# Patient Record
Sex: Female | Born: 1945 | Race: White | Hispanic: No | Marital: Married | Smoking: Former smoker
Health system: Southern US, Community
[De-identification: ages and names within clinical notes are randomized; demographics above are authoritative.]

## PROBLEM LIST (undated history)

## (undated) DIAGNOSIS — K5790 Diverticulosis of intestine, part unspecified, without perforation or abscess without bleeding: Secondary | ICD-10-CM

## (undated) DIAGNOSIS — F329 Major depressive disorder, single episode, unspecified: Secondary | ICD-10-CM

## (undated) DIAGNOSIS — R413 Other amnesia: Secondary | ICD-10-CM

## (undated) DIAGNOSIS — K219 Gastro-esophageal reflux disease without esophagitis: Secondary | ICD-10-CM

## (undated) DIAGNOSIS — G43909 Migraine, unspecified, not intractable, without status migrainosus: Secondary | ICD-10-CM

## (undated) DIAGNOSIS — M810 Age-related osteoporosis without current pathological fracture: Secondary | ICD-10-CM

## (undated) DIAGNOSIS — I639 Cerebral infarction, unspecified: Secondary | ICD-10-CM

## (undated) DIAGNOSIS — M1990 Unspecified osteoarthritis, unspecified site: Secondary | ICD-10-CM

## (undated) DIAGNOSIS — E785 Hyperlipidemia, unspecified: Secondary | ICD-10-CM

## (undated) DIAGNOSIS — T7840XA Allergy, unspecified, initial encounter: Secondary | ICD-10-CM

## (undated) DIAGNOSIS — L940 Localized scleroderma [morphea]: Secondary | ICD-10-CM

## (undated) DIAGNOSIS — K589 Irritable bowel syndrome without diarrhea: Secondary | ICD-10-CM

## (undated) DIAGNOSIS — F32A Depression, unspecified: Secondary | ICD-10-CM

## (undated) DIAGNOSIS — H269 Unspecified cataract: Secondary | ICD-10-CM

## (undated) DIAGNOSIS — I609 Nontraumatic subarachnoid hemorrhage, unspecified: Principal | ICD-10-CM

## (undated) DIAGNOSIS — F419 Anxiety disorder, unspecified: Secondary | ICD-10-CM

## (undated) DIAGNOSIS — S62109A Fracture of unspecified carpal bone, unspecified wrist, initial encounter for closed fracture: Secondary | ICD-10-CM

## (undated) DIAGNOSIS — I619 Nontraumatic intracerebral hemorrhage, unspecified: Secondary | ICD-10-CM

## (undated) DIAGNOSIS — I10 Essential (primary) hypertension: Principal | ICD-10-CM

## (undated) HISTORY — DX: Hyperlipidemia, unspecified: E78.5

## (undated) HISTORY — PX: ANKLE FRACTURE SURGERY: SHX122

## (undated) HISTORY — PX: TUBAL LIGATION: SHX77

## (undated) HISTORY — DX: Depression, unspecified: F32.A

## (undated) HISTORY — PX: OTHER SURGICAL HISTORY: SHX169

## (undated) HISTORY — DX: Essential (primary) hypertension: I10

## (undated) HISTORY — DX: Age-related osteoporosis without current pathological fracture: M81.0

## (undated) HISTORY — DX: Unspecified cataract: H26.9

## (undated) HISTORY — DX: Gastro-esophageal reflux disease without esophagitis: K21.9

## (undated) HISTORY — DX: Other amnesia: R41.3

## (undated) HISTORY — DX: Unspecified osteoarthritis, unspecified site: M19.90

## (undated) HISTORY — DX: Migraine, unspecified, not intractable, without status migrainosus: G43.909

## (undated) HISTORY — DX: Fracture of unspecified carpal bone, unspecified wrist, initial encounter for closed fracture: S62.109A

## (undated) HISTORY — DX: Cerebral infarction, unspecified: I63.9

## (undated) HISTORY — DX: Diverticulosis of intestine, part unspecified, without perforation or abscess without bleeding: K57.90

## (undated) HISTORY — PX: COLONOSCOPY: SHX174

## (undated) HISTORY — PX: POLYPECTOMY: SHX149

## (undated) HISTORY — DX: Major depressive disorder, single episode, unspecified: F32.9

## (undated) HISTORY — DX: Anxiety disorder, unspecified: F41.9

## (undated) HISTORY — DX: Nontraumatic intracerebral hemorrhage, unspecified: I61.9

## (undated) HISTORY — DX: Nontraumatic subarachnoid hemorrhage, unspecified: I60.9

## (undated) HISTORY — DX: Irritable bowel syndrome, unspecified: K58.9

## (undated) HISTORY — DX: Allergy, unspecified, initial encounter: T78.40XA

## (undated) HISTORY — DX: Localized scleroderma (morphea): L94.0

## (undated) SURGERY — MONITORING, ESOPHAGEAL PH, 24 HOUR

---

## 1998-09-21 ENCOUNTER — Other Ambulatory Visit: Admission: RE | Admit: 1998-09-21 | Discharge: 1998-09-21 | Payer: Self-pay | Admitting: Obstetrics and Gynecology

## 1999-09-28 ENCOUNTER — Other Ambulatory Visit: Admission: RE | Admit: 1999-09-28 | Discharge: 1999-09-28 | Payer: Self-pay | Admitting: Obstetrics and Gynecology

## 2001-05-08 ENCOUNTER — Other Ambulatory Visit: Admission: RE | Admit: 2001-05-08 | Discharge: 2001-05-08 | Payer: Self-pay | Admitting: Obstetrics and Gynecology

## 2001-11-15 ENCOUNTER — Encounter: Payer: Self-pay | Admitting: Internal Medicine

## 2003-02-13 ENCOUNTER — Encounter: Admission: RE | Admit: 2003-02-13 | Discharge: 2003-02-13 | Payer: Self-pay | Admitting: Internal Medicine

## 2003-02-13 ENCOUNTER — Encounter: Payer: Self-pay | Admitting: Internal Medicine

## 2003-09-30 ENCOUNTER — Ambulatory Visit (HOSPITAL_COMMUNITY): Admission: RE | Admit: 2003-09-30 | Discharge: 2003-09-30 | Payer: Self-pay | Admitting: Internal Medicine

## 2003-09-30 ENCOUNTER — Encounter: Payer: Self-pay | Admitting: Internal Medicine

## 2004-01-29 ENCOUNTER — Other Ambulatory Visit: Admission: RE | Admit: 2004-01-29 | Discharge: 2004-01-29 | Payer: Self-pay | Admitting: Obstetrics and Gynecology

## 2008-11-18 ENCOUNTER — Ambulatory Visit: Payer: Self-pay | Admitting: Internal Medicine

## 2008-11-18 DIAGNOSIS — F329 Major depressive disorder, single episode, unspecified: Secondary | ICD-10-CM

## 2008-11-18 DIAGNOSIS — K921 Melena: Secondary | ICD-10-CM | POA: Insufficient documentation

## 2008-11-18 DIAGNOSIS — M810 Age-related osteoporosis without current pathological fracture: Secondary | ICD-10-CM | POA: Insufficient documentation

## 2008-11-18 DIAGNOSIS — K589 Irritable bowel syndrome without diarrhea: Secondary | ICD-10-CM | POA: Insufficient documentation

## 2008-11-18 DIAGNOSIS — M19042 Primary osteoarthritis, left hand: Secondary | ICD-10-CM | POA: Insufficient documentation

## 2008-11-18 DIAGNOSIS — L940 Localized scleroderma [morphea]: Secondary | ICD-10-CM | POA: Insufficient documentation

## 2008-11-18 DIAGNOSIS — R195 Other fecal abnormalities: Secondary | ICD-10-CM | POA: Insufficient documentation

## 2008-11-18 DIAGNOSIS — E7800 Pure hypercholesterolemia, unspecified: Secondary | ICD-10-CM | POA: Insufficient documentation

## 2008-11-18 DIAGNOSIS — F32A Depression, unspecified: Secondary | ICD-10-CM | POA: Insufficient documentation

## 2008-11-18 DIAGNOSIS — Z87898 Personal history of other specified conditions: Secondary | ICD-10-CM | POA: Insufficient documentation

## 2008-11-18 DIAGNOSIS — M19041 Primary osteoarthritis, right hand: Secondary | ICD-10-CM

## 2008-11-19 ENCOUNTER — Ambulatory Visit: Payer: Self-pay | Admitting: Internal Medicine

## 2010-06-15 ENCOUNTER — Ambulatory Visit (HOSPITAL_COMMUNITY): Admission: RE | Admit: 2010-06-15 | Discharge: 2010-06-15 | Payer: Self-pay | Admitting: Internal Medicine

## 2011-07-19 ENCOUNTER — Encounter (HOSPITAL_COMMUNITY): Payer: PRIVATE HEALTH INSURANCE | Attending: Internal Medicine

## 2011-07-19 DIAGNOSIS — Z79899 Other long term (current) drug therapy: Secondary | ICD-10-CM | POA: Insufficient documentation

## 2011-07-19 DIAGNOSIS — M810 Age-related osteoporosis without current pathological fracture: Secondary | ICD-10-CM | POA: Insufficient documentation

## 2011-07-29 ENCOUNTER — Ambulatory Visit (INDEPENDENT_AMBULATORY_CARE_PROVIDER_SITE_OTHER): Payer: PRIVATE HEALTH INSURANCE | Admitting: Internal Medicine

## 2011-07-29 ENCOUNTER — Encounter: Payer: Self-pay | Admitting: Internal Medicine

## 2011-07-29 VITALS — BP 118/64 | HR 72 | Ht 63.0 in | Wt 124.0 lb

## 2011-07-29 DIAGNOSIS — R1031 Right lower quadrant pain: Secondary | ICD-10-CM

## 2011-07-29 NOTE — Progress Notes (Signed)
Heather Lindsey 1946/09/06 MRN [id number]    History of Present Illness:  This is a 65 year old white female with chronic intermittent right lower quadrant abdominal pain. The pain was evaluated in 2002 and again in 2004 with a CT scan of the abdomen which was essentially negative. She was found to have mild prominence between the head and the body of the pancreas which was not confirmed on ultrasound. She has a tendency toward constipation and occasionally takes stool softeners. She denies rectal bleeding. Her weight has been stable. She has never had any abdominal surgery. Her last colonoscopy in December 2009 showed moderately severe diverticulosis of the left colon.    Past Medical History  Diagnosis Date  . Migraines   . Osteoarthritis   . Circumscribed scleroderma   . Hyperlipidemia   . Depression   . IBS (irritable bowel syndrome)   . Diverticulosis    Past Surgical History  Procedure Date  . Tubal ligation     reports that she has quit smoking. She has never used smokeless tobacco. She reports that she drinks alcohol. She reports that she does not use illicit drugs. family history includes Heart disease in her father; Liver cancer in her paternal aunt; and Prostate cancer in her paternal grandfather.  There is no history of Colon cancer. No Known Allergies      Review of Systems: Denies fever chills. Shortness of breath or chest pain  The remainder of the 10  point ROS is negative except as outlined in H&P   Physical Exam: General appearance  Well developed, in no distress. Eyes- non icteric. HEENT nontraumatic, normocephalic. Mouth no lesions, tongue papillated, no cheilosis. Neck supple without adenopathy, thyroid not enlarged, no carotid bruits, no JVD. Lungs Clear to auscultation bilaterally. Cor normal S1 normal S2, regular rhythm , no murmur,  quiet precordium. Abdomen soft relaxed abdomen but no palpable tenderness. No palpable mass. No distention. The right lower  quadrant is unremarkable. Straight leg raising is negative. Standing up and laying down does not precipitate pain. Rectal: No stool. Mucus is Hemoccult negative. Extremities no pedal edema. Skin no lesions. Neurological alert and oriented x 3. Psychological normal mood and affect.  Assessment and Plan:  Problem #1 Chronic right lower quadrant abdominal pain which is intermittent and associated with mild constipation. It seems to be subsiding. Her diverticulosis is in the sigmoid colon. The last CT scan did not visualize the appendix but there were no inflammatory changes or internal hernia. I advised her to take stool softeners daily and even take MiraLax when necessary. If symptoms continue, we will consider repeating a CT scan with attention to the right lower quadrant. She is up-to-date on her colonoscopy. A recall will be due in 2019.   07/29/2011 [name]

## 2011-07-29 NOTE — Patient Instructions (Addendum)
CC:Dr [employer]

## 2012-07-20 ENCOUNTER — Other Ambulatory Visit (HOSPITAL_COMMUNITY): Payer: Self-pay | Admitting: *Deleted

## 2012-07-24 ENCOUNTER — Encounter (HOSPITAL_COMMUNITY)
Admission: RE | Admit: 2012-07-24 | Discharge: 2012-07-24 | Disposition: A | Discharge status: Home / Self Care | Payer: Medicare Other | Source: Ambulatory Visit | Attending: Internal Medicine | Admitting: Internal Medicine

## 2012-07-24 ENCOUNTER — Encounter (HOSPITAL_COMMUNITY): Payer: Self-pay

## 2012-07-24 DIAGNOSIS — M810 Age-related osteoporosis without current pathological fracture: Secondary | ICD-10-CM | POA: Insufficient documentation

## 2012-07-24 MED ORDER — SODIUM CHLORIDE 0.9 % IV SOLN
Freq: Once | INTRAVENOUS | Status: AC
  Administered 2012-07-24: 250 mL via INTRAVENOUS

## 2012-07-24 MED ORDER — ZOLEDRONIC ACID 5 MG/100ML IV SOLN
5.0000 mg | Freq: Once | INTRAVENOUS | Status: AC
  Administered 2012-07-24: 5 mg via INTRAVENOUS
  Filled 2012-07-24: qty 100

## 2012-10-02 SURGERY — Surgical Case
Anesthesia: *Unknown

## 2013-12-12 DIAGNOSIS — I639 Cerebral infarction, unspecified: Secondary | ICD-10-CM

## 2013-12-12 HISTORY — DX: Cerebral infarction, unspecified: I63.9

## 2014-07-18 ENCOUNTER — Encounter: Payer: Self-pay | Admitting: Internal Medicine

## 2014-09-10 ENCOUNTER — Ambulatory Visit: Payer: Medicare Other

## 2014-09-10 ENCOUNTER — Ambulatory Visit: Payer: Medicare Other | Attending: Internal Medicine | Admitting: Physical Therapy

## 2014-09-10 DIAGNOSIS — R41841 Cognitive communication deficit: Secondary | ICD-10-CM | POA: Diagnosis not present

## 2014-09-10 DIAGNOSIS — Z5189 Encounter for other specified aftercare: Secondary | ICD-10-CM | POA: Insufficient documentation

## 2014-09-10 DIAGNOSIS — R5381 Other malaise: Secondary | ICD-10-CM | POA: Insufficient documentation

## 2014-09-10 DIAGNOSIS — I69919 Unspecified symptoms and signs involving cognitive functions following unspecified cerebrovascular disease: Secondary | ICD-10-CM | POA: Insufficient documentation

## 2014-09-24 ENCOUNTER — Ambulatory Visit: Payer: Medicare Other | Admitting: Neurology

## 2014-09-24 ENCOUNTER — Ambulatory Visit: Payer: Medicare Other

## 2014-09-30 ENCOUNTER — Ambulatory Visit: Payer: Medicare Other | Attending: Internal Medicine | Admitting: Speech Pathology

## 2014-09-30 DIAGNOSIS — I6991 Cognitive deficits following unspecified cerebrovascular disease: Secondary | ICD-10-CM | POA: Diagnosis present

## 2014-09-30 DIAGNOSIS — R41841 Cognitive communication deficit: Secondary | ICD-10-CM | POA: Diagnosis not present

## 2014-09-30 DIAGNOSIS — I69951 Hemiplegia and hemiparesis following unspecified cerebrovascular disease affecting right dominant side: Secondary | ICD-10-CM | POA: Diagnosis present

## 2014-10-01 ENCOUNTER — Ambulatory Visit (INDEPENDENT_AMBULATORY_CARE_PROVIDER_SITE_OTHER): Payer: Medicare Other | Admitting: Neurology

## 2014-10-01 ENCOUNTER — Encounter (INDEPENDENT_AMBULATORY_CARE_PROVIDER_SITE_OTHER): Payer: Self-pay

## 2014-10-01 ENCOUNTER — Encounter: Payer: Self-pay | Admitting: Neurology

## 2014-10-01 VITALS — BP 128/72 | HR 97 | Temp 97.2°F | Resp 14 | Ht 63.5 in | Wt 131.0 lb

## 2014-10-01 DIAGNOSIS — F05 Delirium due to known physiological condition: Secondary | ICD-10-CM

## 2014-10-01 DIAGNOSIS — I609 Nontraumatic subarachnoid hemorrhage, unspecified: Secondary | ICD-10-CM

## 2014-10-01 DIAGNOSIS — G43009 Migraine without aura, not intractable, without status migrainosus: Secondary | ICD-10-CM

## 2014-10-01 DIAGNOSIS — I10 Essential (primary) hypertension: Secondary | ICD-10-CM

## 2014-10-01 DIAGNOSIS — R410 Disorientation, unspecified: Secondary | ICD-10-CM

## 2014-10-01 DIAGNOSIS — E871 Hypo-osmolality and hyponatremia: Secondary | ICD-10-CM

## 2014-10-01 HISTORY — DX: Essential (primary) hypertension: I10

## 2014-10-01 MED ORDER — PROPRANOLOL HCL ER 60 MG PO CP24: ORAL_CAPSULE | ORAL | Status: DC

## 2014-10-01 NOTE — Patient Instructions (Signed)
Used for migraine prophylaxis  Propranolol extended-release capsules What is this medicine? PROPRANOLOL (proe PRAN oh lole) is a beta-blocker. Beta-blockers reduce the workload on the heart and help it to beat more regularly. This medicine is used to treat high blood pressure, heart muscle disease, and prevent chest pain caused by angina. It is also used to prevent migraine headaches. You should not use this medicine to treat a migraine that has already started. This medicine may be used for other purposes; ask your health care provider or pharmacist if you have questions. COMMON BRAND NAME(S): Inderal LA, Inderal XL, InnoPran XL What should I tell my health care provider before I take this medicine? They need to know if you have any of these conditions: -circulation problems, or blood vessel disease -diabetes -history of heart attack or heart disease, vasospastic angina -kidney disease -liver disease -lung or breathing disease, like asthma or emphysema -pheochromocytoma -slow heart rate -thyroid disease -an unusual or allergic reaction to propranolol, other beta-blockers, medicines, foods, dyes, or preservatives -pregnant or trying to get pregnant -breast-feeding How should I use this medicine? Take this medicine by mouth with a glass of water. Follow the directions on the prescription label. Do not crush or chew. Take your doses at regular intervals. Do not take your medicine more often than directed. Do not stop taking except on the advice of your doctor or health care professional. Talk to your pediatrician regarding the use of this medicine in children. Special care may be needed. Overdosage: If you think you have taken too much of this medicine contact a poison control center or emergency room at once. NOTE: This medicine is only for you. Do not share this medicine with others. What if I miss a dose? If you miss a dose, take it as soon as you can. If it is almost time for your  next dose, take only that dose. Do not take double or extra doses. What may interact with this medicine? Do not take this medicine with any of the following medications: -feverfew -phenothiazines like chlorpromazine, mesoridazine, prochlorperazine, thioridazine This medicine may also interact with the following medications: -aluminum hydroxide gel -antipyrine -antiviral medicines for HIV or AIDS -barbiturates like phenobarbital -certain medicines for blood pressure, heart disease, irregular heart beat -cimetidine -ciprofloxacin -diazepam -fluconazole -haloperidol -isoniazid -medicines for cholesterol like cholestyramine or colestipol -medicines for mental depression -medicines for migraine headache like almotriptan, eletriptan, frovatriptan, naratriptan, rizatriptan, sumatriptan, zolmitriptan -NSAIDs, medicines for pain and inflammation, like ibuprofen or naproxen -phenytoin -rifampin -teniposide -theophylline -thyroid medicines -tolbutamide -warfarin -zileuton This list may not describe all possible interactions. Give your health care provider a list of all the medicines, herbs, non-prescription drugs, or dietary supplements you use. Also tell them if you smoke, drink alcohol, or use illegal drugs. Some items may interact with your medicine. What should I watch for while using this medicine? Visit your doctor or health care professional for regular check ups. Contact your doctor right away if your symptoms worsen. Check your blood pressure and pulse rate regularly. Ask your health care professional what your blood pressure and pulse rate should be, and when you should contact them. Do not stop taking this medicine suddenly. This could lead to serious heart-related effects. You may get drowsy or dizzy. Do not drive, use machinery, or do anything that needs mental alertness until you know how this drug affects you. Do not stand or sit up quickly, especially if you are an older  patient. This reduces the risk  of dizzy or fainting spells. Alcohol can make you more drowsy and dizzy. Avoid alcoholic drinks. This medicine can affect blood sugar levels. If you have diabetes, check with your doctor or health care professional before you change your diet or the dose of your diabetic medicine. Do not treat yourself for coughs, colds, or pain while you are taking this medicine without asking your doctor or health care professional for advice. Some ingredients may increase your blood pressure. What side effects may I notice from receiving this medicine? Side effects that you should report to your doctor or health care professional as soon as possible: -allergic reactions like skin rash, itching or hives, swelling of the face, lips, or tongue -breathing problems -changes in blood sugar -cold hands or feet -difficulty sleeping, nightmares -dry peeling skin -hallucinations -muscle cramps or weakness -slow heart rate -swelling of the legs and ankles -vomiting Side effects that usually do not require medical attention (report to your doctor or health care professional if they continue or are bothersome): -change in sex drive or performance -diarrhea -dry sore eyes -hair loss -nausea -weak or tired This list may not describe all possible side effects. Call your doctor for medical advice about side effects. You may report side effects to FDA at 1-800-FDA-1088. Where should I keep my medicine? Keep out of the reach of children. Store at room temperature between 15 and 30 degrees C (59 and 86 degrees F). Protect from light, moisture and freezing. Keep container tightly closed. Throw away any unused medicine after the expiration date. NOTE: This sheet is a summary. It may not cover all possible information. If you have questions about this medicine, talk to your doctor, pharmacist, or health care provider.  2015, Elsevier/Gold Standard. (2013-08-02 14:58:56)

## 2014-10-01 NOTE — Progress Notes (Signed)
SLEEP MEDICINE CLINIC   Provider:  Larey Seat, M D  Referring Provider: Tivis Ringer, MD Primary Care Physician:  Tivis Ringer, MD  Chief Complaint  Patient presents with  . NP Stroke    Rm 10, alone    HPI:  Heather Lindsey is a 68 y.o. female, who is seen here as a referral  from Dr. Dagmar Hait for a stroke follow up from [facility];  Heather Lindsey had reportedly no history of any neurologic abnormalities when she suffered an unusual headache that she first thought was a migraine. She was swimming with her 12-year-old grandson and playing and when she went back to the bedroom, took a maxalt -  the headache became quite severe. She became confused and violently nauseated and vomited,was brought by Private car to [facility]. At the time the emergency room described a patient that appeared somewhat delirious and did not appear aware or coordinated. A CT obtained documented a large intraparenchymal hematoma in the right frontal lobe associated with surrounding mass effect. There was midline shift noted. There was no evidence of an acute infarction no history of an aneurysm or malformation of the vessel. The patient reports that 3 days prior she had an accident at home then she when a vase ,rather heavy glass object fell on the top of her head from a top cupboard.  During hospitalization she remained confused she also developed hypernatremia which can be attributed to to live brain hematoma in the setting of an [place] more likely and as I ADH. Fluid restrictions were given and the patient to monitor her sodium intake. She was then given sodium tablets at the time of her discharge. She was discharged after her the patient remained in the hospital from 08/21/2014 to 08/26/2014. She remained hyponatremic she was given steroids. And her discharge MRI still shows a larger lesion in the right frontal lobe parenchyma with midline shift mass affect and edema.  Her history of migraines  was discussed today, Maxalt and other triptans are not indicated. She remains with right upper extremitry numbness tingling, and she is had this kind of symptoms never before the [place].   The cause/ origin of the bleed is still unknown.       Review of Systems: Out of a complete 14 system review, the patient complains of only the following symptoms, and all other reviewed systems are negative.  right arm dysesthesias.   depression score: 5 , always clinical depressed, seasonal exacerbation.    History   Social History  . Marital Status: Married    Spouse Name: N/A    Number of Children: N/A  . Years of Education: N/A   Occupational History  . Retired     Social History Main Topics  . Smoking status: Former Research scientist (life sciences)  . Smokeless tobacco: Never Used     Comment: Quit now 40 years   . Alcohol Use: Yes     Comment: glass of wine daily   . Drug Use: No  . Sexual Activity: Not on file   Other Topics Concern  . Not on file   Social History Narrative   Daily caffeine, 2 glasses daily,   Right handed, Married, 2 sons. Retired. College.    Family History  Problem Relation Age of Onset  . Liver cancer Paternal Aunt   . Heart disease Father   . Prostate cancer Paternal Grandfather   . Colon cancer Neg Hx     Past Medical History  Diagnosis Date  . Migraines   . Osteoarthritis   . Circumscribed scleroderma   . Hyperlipidemia   . Depression   . IBS (irritable bowel syndrome)   . Diverticulosis   . Anxiety     Past Surgical History  Procedure Laterality Date  . Tubal ligation    . Broken ankle Left     Current Outpatient Prescriptions  Medication Sig Dispense Refill  . acetaminophen (TYLENOL) 500 MG tablet Take 500 mg by mouth every 6 (six) hours as needed.        . Alum Hydroxide-Mag Carbonate (GAVISCON PO) Take by mouth as needed.        Marland Kitchen amLODipine (NORVASC) 5 MG tablet Take by mouth.      . calcium carbonate (TUMS - DOSED IN MG ELEMENTAL CALCIUM) 500 MG  chewable tablet Chew 1 tablet by mouth as needed.        . Calcium Citrate-Vitamin D 500-250 MG-UNIT PACK Take 1 capsule by mouth daily.        . celecoxib (CELEBREX) 200 MG capsule Take 200 mg by mouth daily.        . Cholecalciferol (VITAMIN D) 2000 UNITS CAPS Take 1 capsule by mouth. 3 time a week       . FLUoxetine (PROZAC) 20 MG capsule Take 20 mg by mouth daily.        . Folic Acid-Vit Q5-ZDG L87 (FOLGARD PO) Take 1 capsule by mouth daily.        Marland Kitchen loratadine (CLARITIN) 10 MG tablet Take 10 mg by mouth as needed.        . Multiple Vitamins-Minerals (CENTRUM PO) Take 1 capsule by mouth daily.        Marland Kitchen omeprazole (PRILOSEC) 20 MG capsule Take 20 mg by mouth daily.        Marland Kitchen oxyCODONE (OXY IR/ROXICODONE) 5 MG immediate release tablet Take 5 mg by mouth at bedtime as needed.       . rizatriptan (MAXALT) 10 MG tablet Take 10 mg by mouth as needed. May repeat in 2 hours if needed       . sodium chloride 1 G tablet Take 1 g by mouth 3 (three) times daily.      . traMADol (ULTRAM) 50 MG tablet Take 100 mg by mouth 2 (two) times daily.        No current facility-administered medications for this visit.    Allergies as of 10/01/2014  . (No Known Allergies)    Vitals: BP 128/72  Pulse 97  Temp(Src) 97.2 F (36.2 C) (Oral)  Resp 14  Ht 5' 3.5" (1.613 m)  Wt 131 lb (59.421 kg)  BMI 22.84 kg/m2 Last Weight:  Wt Readings from Last 1 Encounters:  10/01/14 131 lb (59.421 kg)       Last Height:   Ht Readings from Last 1 Encounters:  10/01/14 5' 3.5" (1.613 m)    Physical exam:  General: The patient is awake, alert and appears not in acute distress. The patient is well groomed. Head: Normocephalic, atraumatic. Neck is supple. Mallampati 2,  neck circumference: 14.5 . Nasal airflow unrestricted  TMJ is  Not evident . Cardiovascular:  Regular rate and rhythm, without  murmurs or carotid bruit, and without distended neck veins. Respiratory: Lungs are clear to auscultation. Skin:  Without  evidence of edema, or rash Trunk: BMI is  elevated and patient  has normal posture.  Neurologic exam : The patient is awake and alert, oriented to place and  time.   Memory subjective  described as intact. There is a normal attention span & concentration ability.  Speech is fluent without  dysarthria, dysphonia or aphasia.   Mood and affect are appropriate.  Cranial nerves: Pupils are equal and briskly reactive to light. Funduscopic exam without  evidence of pallor or edema.  Extraocular movements in vertical and horizontal planes intact and without nystagmus.  Visual fields by finger perimetry are intact. Hearing to finger rub intact.  Facial sensation intact to fine touch.  Facial motor strength is symmetric and tongue and uvula move midline.  Motor exam: Normal tone, muscle bulk and symmetric strength in all extremities.  Sensory:  Fine touch, pinprick and vibration were tested in all extremities.  Proprioception is tested in the upper extremities only. This was  normal.  Coordination: Rapid alternating movements in the fingers/hands is normal.  Finger-to-nose maneuver  has  evidence of dysmetria, not  Tremor.this is a bilateral finding.   Gait and station: Patient walks without assistive device and is able unassisted to climb up to the exam table.  Strength within normal limits. Stance is stable and normal.Tandem gait is slightly fragmented.   Romberg testing is negative.  Deep tendon reflexes: in the upper and lower extremities are symmetric- brisk  and intact. Babinski maneuver response is downgoing.   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 Needs MRI /MRA, to evaluate for source of bleed.  Brain edema, fatigue, hypersomnia since the stroke.  2) Needs SIADH versus cerebral salt wasting  follow up - sodium measures. 3)migraine unrelated to the bleed, long standing but not longer permitted to treat with triptans. Suggest a preventive medication, Beta blocker. Propanolol.    The patient was advised of the nature of the diagnosed disorder, the treatment options and risks for general a health and wellness arising from not treating the condition.  Reviewed MRI and labs and hospital records with the patient. Visit duration was 60  minutes.   Plan:  Treatment plan and additional workup : Rv in 2 month with me .      Asencion Partridge Vala Raffo MD  10/01/2014

## 2014-10-02 ENCOUNTER — Encounter: Payer: Medicare Other | Admitting: Speech Pathology

## 2014-10-03 ENCOUNTER — Ambulatory Visit: Payer: Medicare Other | Admitting: Occupational Therapy

## 2014-10-03 ENCOUNTER — Ambulatory Visit: Payer: Medicare Other

## 2014-10-03 DIAGNOSIS — I69951 Hemiplegia and hemiparesis following unspecified cerebrovascular disease affecting right dominant side: Secondary | ICD-10-CM | POA: Diagnosis not present

## 2014-10-08 ENCOUNTER — Ambulatory Visit: Payer: Medicare Other

## 2014-10-08 ENCOUNTER — Ambulatory Visit: Payer: Medicare Other | Admitting: Occupational Therapy

## 2014-10-08 DIAGNOSIS — I69951 Hemiplegia and hemiparesis following unspecified cerebrovascular disease affecting right dominant side: Secondary | ICD-10-CM | POA: Diagnosis not present

## 2014-10-14 ENCOUNTER — Ambulatory Visit
Admission: RE | Admit: 2014-10-14 | Discharge: 2014-10-14 | Disposition: A | Discharge status: Home / Self Care | Payer: Medicare Other | Source: Ambulatory Visit | Attending: Neurology | Admitting: Neurology

## 2014-10-14 DIAGNOSIS — I10 Essential (primary) hypertension: Secondary | ICD-10-CM

## 2014-10-14 DIAGNOSIS — F05 Delirium due to known physiological condition: Secondary | ICD-10-CM

## 2014-10-14 DIAGNOSIS — I609 Nontraumatic subarachnoid hemorrhage, unspecified: Secondary | ICD-10-CM

## 2014-10-14 DIAGNOSIS — G43009 Migraine without aura, not intractable, without status migrainosus: Secondary | ICD-10-CM

## 2014-10-14 DIAGNOSIS — R410 Disorientation, unspecified: Secondary | ICD-10-CM

## 2014-10-14 DIAGNOSIS — E871 Hypo-osmolality and hyponatremia: Secondary | ICD-10-CM

## 2014-10-14 MED ORDER — GADOBENATE DIMEGLUMINE 529 MG/ML IV SOLN
10.0000 mL | Freq: Once | INTRAVENOUS | Status: AC | PRN
  Administered 2014-10-14: 10 mL via INTRAVENOUS

## 2014-10-16 ENCOUNTER — Encounter: Payer: Self-pay | Admitting: Neurology

## 2014-10-21 ENCOUNTER — Ambulatory Visit: Payer: Medicare Other | Attending: Internal Medicine

## 2014-10-21 DIAGNOSIS — I69951 Hemiplegia and hemiparesis following unspecified cerebrovascular disease affecting right dominant side: Secondary | ICD-10-CM | POA: Diagnosis present

## 2014-10-21 DIAGNOSIS — R41841 Cognitive communication deficit: Secondary | ICD-10-CM

## 2014-10-21 DIAGNOSIS — I6991 Cognitive deficits following unspecified cerebrovascular disease: Secondary | ICD-10-CM | POA: Diagnosis present

## 2014-10-21 NOTE — Therapy (Signed)
Speech Language Pathology Treatment  Patient Details  Name: Heather Lindsey MRN: [id number] Date of Birth: 1946-05-29  Encounter Date: 10/21/2014      End of Session - 10/21/14 1241    Visit Number 5   Number of Visits 16   Date for SLP Re-Evaluation 11/09/14   SLP Start Time 1149   SLP Time Calculation (min) 1234   SLP Time Calculation (min) 45 min   Activity Tolerance Patient tolerated treatment well      Past Medical History  Diagnosis Date  . Migraines   . Osteoarthritis   . Circumscribed scleroderma   . Hyperlipidemia   . Depression   . IBS (irritable bowel syndrome)   . Diverticulosis   . Anxiety   . Memory loss   . Brain bleed   . 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 Tubal ligation    . Broken ankle Left     There were no vitals taken for this visit.  Visit Diagnosis: Cognitive communication deficit    S: Pt unsure if present attention level is at baseline. Pt to complete activities requiring incr'd attention skills (new recipe, scheduling errands, etc) and report back to SLP next session.      ADULT SLP TREATMENT - 10/21/14 0001    General Information   Behavior/Cognition Alert;Cooperative;Pleasant mood;Requires cueing   HPI Pt with CVA and cognitive-communication deficits resulting.   Treatment Provided   Treatment provided Cognitive-Linquistic   Pain Assessment   Pain Assessment No/denies pain   Cognitive-Linquistic Treatment   Treatment focused on Cognition   Skilled Treatment Pt completed organizational tasks requiring organization and sustained/selective attention skills with SLP guidance. Pt with good skills with alternating attention as well as divided attention for simple conversation and task completion.     Assessment / Recommendations / Plan   Plan Continue with current plan of care  Pt to complete a task requiring incr'd attention and report.   Progression  Toward Goals   Progression toward goals Progressing toward goals          SLP Education - 10/21/14 1240    Education provided Yes   Education Details rec. pt stop and think about situation prior to leaving an area (e.g., laundry) so she is not returning multiple times   Person(s) Educated Patient   Methods Explanation   Comprehension Verbalized understanding          SLP Short Term Goals - 10/21/14 1148    SLP SHORT TERM GOAL #1   Title alternate attention between two mod complex tasks with 85% success    Time 4   Period Weeks   Status Achieved   SLP SHORT TERM GOAL #2   Title pt will demo emergent awareness in therarpy sessions 90%   Time 4   Period Weeks   Status Achieved   SLP SHORT TERM GOAL #3   Title pt will selectively attend to mod difficult linguistic tasks for 15 minutes in mod distracting environment   Time 4   Period Weeks   Status New          SLP Long Term Goals - 10/21/14 1244    SLP LONG TERM GOAL #1   Title pt will verbalize appropriate compensations for attention   Time 8   Period Weeks   Status New   SLP LONG TERM GOAL #2   Title pt will divide attention in mod difficult  linguistic tasks for 15 minutes with 85% each task   Time 8   Period Weeks   Status New   SLP LONG TERM GOAL #3   Title pt will alternate attention between two complex cognitive linguistic tasks with 90% success in both   Time 8   Period Weeks   Status New          Plan - 10/21/14 1242    Clinical Impression Statement Pt is progressing with attention. Questionable whether pt's attention is at baseline or not. Pt with reduced planning/foresight demo'd by pt's report of pt's laundry room return x3.   Speech Therapy Frequency 2x / week   Duration --  8 weeks   Treatment/Interventions Patient/family education;Compensatory strategies;Internal/external aids;Environmental controls;SLP instruction and feedback;Cognitive reorganization;Functional tasks   Potential to Achieve  Goals Good   Consulted and Agree with Plan of Care Patient        Problem List Patient Active Problem List   Diagnosis Date Noted  . 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 HYPERCHOLESTEROLEMIA 11/18/2008  . DEPRESSION 11/18/2008  . IRRITABLE BOWEL SYNDROME 11/18/2008  . CIRCUMSCRIBED SCLERODERMA 11/18/2008  . OSTEOARTHRITIS 11/18/2008  . OSTEOPOROSIS 11/18/2008  . MIGRAINES, HX OF 11/18/2008                                               [facility] 10/21/2014, 12:47 PM

## 2014-10-23 ENCOUNTER — Ambulatory Visit: Payer: Medicare Other

## 2014-10-23 DIAGNOSIS — I69951 Hemiplegia and hemiparesis following unspecified cerebrovascular disease affecting right dominant side: Secondary | ICD-10-CM | POA: Diagnosis not present

## 2014-10-23 DIAGNOSIS — R41841 Cognitive communication deficit: Secondary | ICD-10-CM

## 2014-10-23 NOTE — Therapy (Signed)
Speech Language Pathology Treatment  Patient Details  Name: Heather Lindsey MRN: [id number] Date of Birth: 1946/01/16  Encounter Date: 10/23/2014      End of Session - 10/23/14 1436    Visit Number 6   Number of Visits 16   Date for SLP Re-Evaluation 11/09/14   SLP Start Time 1406   SLP Time Calculation (min) 1448   SLP Time Calculation (min) 42 min      Past Medical History  Diagnosis Date  . Migraines   . Osteoarthritis   . Circumscribed scleroderma   . Hyperlipidemia   . Depression   . IBS (irritable bowel syndrome)   . Diverticulosis   . Anxiety   . Memory loss   . Brain bleed   . 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 Tubal ligation    . Broken ankle Left     There were no vitals taken for this visit.  Visit Diagnosis: Cognitive communication deficit    S: Pt did not complete homework due to forgetting, with busy schedule with family and other family issues (and not due to recent neurological event).      ADULT SLP TREATMENT - 10/23/14 1423    General Information   Behavior/Cognition Alert;Cooperative;Pleasant mood   Cognitive-Linquistic Treatment   Treatment focused on Cognition   Skilled Treatment Pt required initial cue to read instructions on worksheet for detailed instructions, as she began task incorrectly. Task 100% success after that initial cue. In simple deductive reasoning and organization task, pt without difficulty. Pt did not perform homework of completing longer more intense tasks due to "just forgetting" due to busy schedule with other family as reason for forgetting, and not as a result of CVA. Detailed written directions with naming items (divided attention) with approx 70% divided attention skills. Detailed directions completed 77% success and pt told SLP that would have likely been her baseline. Alternating attention with mod complex tasks (checkbook task and detailed  instructions)  completed with good success for placement after alternating. Pt found errors when cued to do so.   Assessment / Recommendations / Plan   Plan Continue with current plan of care   Progression Toward Goals   Progression toward goals Progressing toward goals            SLP Short Term Goals - 10/23/14 1508    SLP SHORT TERM GOAL #3   Status Achieved            Plan - 10/23/14 1437    Clinical Impression Statement Pt did not complete homework due to busy schedule/issues with other family. SLP asked pt if she is at baseline and she thought she was, but hadn't done her daily routine activities fully since hospitalization. SLP/pt agreed to cancel next session and for pt to report any difficulties next session on 11/03/14. Discharge may be completed at that time if no needs arise between now and then.   Treatment/Interventions Patient/family education;Compensatory strategies;Internal/external aids;Environmental controls;SLP instruction and feedback;Cognitive reorganization;Functional tasks   Potential to Achieve Goals Good        Problem List Patient Active Problem List   Diagnosis Date Noted  . 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 HYPERCHOLESTEROLEMIA 11/18/2008  . DEPRESSION 11/18/2008  . IRRITABLE BOWEL SYNDROME 11/18/2008  . CIRCUMSCRIBED SCLERODERMA 11/18/2008  . OSTEOARTHRITIS 11/18/2008  . OSTEOPOROSIS 11/18/2008  . MIGRAINES, HX OF 11/18/2008  [facility], [place] 10/23/2014, 3:11 PM

## 2014-10-30 ENCOUNTER — Encounter: Payer: Medicare Other | Admitting: Speech Pathology

## 2014-10-30 ENCOUNTER — Ambulatory Visit: Payer: Medicare Other | Admitting: Occupational Therapy

## 2014-10-30 DIAGNOSIS — I69319 Unspecified symptoms and signs involving cognitive functions following cerebral infarction: Secondary | ICD-10-CM

## 2014-10-30 DIAGNOSIS — I69951 Hemiplegia and hemiparesis following unspecified cerebrovascular disease affecting right dominant side: Secondary | ICD-10-CM | POA: Diagnosis not present

## 2014-10-30 DIAGNOSIS — IMO0002 Reserved for concepts with insufficient information to code with codable children: Secondary | ICD-10-CM

## 2014-10-30 NOTE — Therapy (Signed)
Occupational Therapy Treatment  Patient Details  Name: Heather Lindsey MRN: [id number] Date of Birth: 1946-09-12  Encounter Date: 10/30/2014      OT End of Session - 10/30/14 1512    Visit Number 3   Number of Visits 17  3/10   Date for OT Re-Evaluation 12/01/14   Authorization Type UHC MCR g code needed   OT Start Time 1400   OT Stop Time 1445   OT Time Calculation (min) 45 min   Activity Tolerance Patient tolerated treatment well   Behavior During Therapy [facility] for tasks assessed/performed      Past Medical History  Diagnosis Date  . Migraines   . Osteoarthritis   . Circumscribed scleroderma   . Hyperlipidemia   . Depression   . IBS (irritable bowel syndrome)   . Diverticulosis   . Anxiety   . Memory loss   . Brain bleed   . 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 Tubal ligation    . Broken ankle Left     There were no vitals taken for this visit.  Visit Diagnosis:  Cognitive deficit S/P CVA (cerebrovascular accident)  Weakness due to cerebrovascular accident      Subjective Assessment - 10/30/14 1511    Currently in Pain? No/denies   Multiple Pain Sites No                OT Short Term Goals - 10/30/14 1427    OT SHORT TERM GOAL #1   Title I with HEP   Baseline await MD clarification regarding precautions   Time 1   Period Weeks   Status On-going   OT SHORT TERM GOAL #2   Title Pt will perform cooking with supervision/ min verbal cues   Time 1   Period Weeks   Status Achieved   OT SHORT TERM GOAL #3   Title Pt. will perform home management activities modified independently in a reasonable amount of time.   Time 1   Period Weeks   Status Achieved   OT SHORT TERM GOAL #4   Title Pt will perform functional activities in a moderately distracting environment while maintaining selective attention x 20 mins.   Time 1   Period Weeks   Status On-going          OT  Long Term Goals - 10/30/14 1441    OT LONG TERM GOAL #1   Title Pt will verbalize understanding of the risk factors and warning signs/ symptoms of CVA.   Baseline ck 12/01/14   Time 5   Period Weeks   Status On-going   OT LONG TERM GOAL #2   Title Pt will perform cooking tasks at a modified indpendent level demonstrating good safety awareness.   Baseline ck 12/01/14   Time 5   Period Weeks   Status On-going   OT LONG TERM GOAL #3   Title Pt will demonstrate divided attention for a physical and cognitive task at 95% accuracy in prep for driving.   Baseline ck 12/01/14   Time 5   Period Weeks   Status On-going          Plan - 10/30/14 1513    Clinical Impression Statement Simple cooking task to make rice mix from package instructions, 1 v.c. to follow directions correctly. Pt demonstrated good safety awareness, and set timer to keep up with time. Divided attention for physical and cognitive  task occasional min v.c., organization task and copying peg design in busy environment with 100% accuracy.   Rehab Potential Good   Clinical Impairments Affecting Rehab Potential cognition   OT Frequency 2x / week   OT Duration 8 weeks   OT Treatment/Interventions Self-care/ADL training;Moist Heat;Fluidtherapy;DME and/or AE instruction;Patient/family education;Therapeutic exercises;Therapeutic exercise;Therapeutic activities;Neuromuscular education;Cognitive remediation/compensation;Visual/perceptual remediation/compensation;Manual Therapy;Energy conservation   Plan issue HEP once clarification of precautions received, cogntion/divided attention   Consulted and Agree with Plan of Care Patient        Problem List Patient Active Problem List   Diagnosis Date Noted  . 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 HYPERCHOLESTEROLEMIA 11/18/2008  . DEPRESSION 11/18/2008  . IRRITABLE BOWEL SYNDROME 11/18/2008  . CIRCUMSCRIBED SCLERODERMA 11/18/2008  . OSTEOARTHRITIS  11/18/2008  . OSTEOPOROSIS 11/18/2008  . MIGRAINES, HX OF 11/18/2008                                             Heather Lindsey, OTR/L [phone] Phone: [phone] 3:24 PM 10/30/2014  Heather Lindsey,Heather Lindsey 10/30/2014, 3:24 PM

## 2014-11-03 ENCOUNTER — Ambulatory Visit: Payer: Medicare Other | Admitting: Occupational Therapy

## 2014-11-03 ENCOUNTER — Ambulatory Visit: Payer: Medicare Other | Admitting: Speech Pathology

## 2014-11-03 DIAGNOSIS — I69319 Unspecified symptoms and signs involving cognitive functions following cerebral infarction: Secondary | ICD-10-CM

## 2014-11-03 DIAGNOSIS — IMO0002 Reserved for concepts with insufficient information to code with codable children: Secondary | ICD-10-CM

## 2014-11-03 DIAGNOSIS — I69951 Hemiplegia and hemiparesis following unspecified cerebrovascular disease affecting right dominant side: Secondary | ICD-10-CM | POA: Diagnosis not present

## 2014-11-03 NOTE — Therapy (Signed)
Speech Language Pathology Treatment  Patient Details  Name: Annete Ayuso MRN: [id number] Date of Birth: 25-Dec-1945  Encounter Date: 11/03/2014      End of Session - 11/03/14 0932    Visit Number 7   Number of Visits 16   Date for SLP Re-Evaluation 11/09/14   SLP Start Time 0846   SLP Time Calculation (min) 0930   SLP Time Calculation (min) 44 min   Activity Tolerance Patient tolerated treatment well      Past Medical History  Diagnosis Date  . Migraines   . Osteoarthritis   . Circumscribed scleroderma   . Hyperlipidemia   . Depression   . IBS (irritable bowel syndrome)   . Diverticulosis   . Anxiety   . Memory loss   . Brain bleed   . 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 Tubal ligation    . Broken ankle Left     There were no vitals taken for this visit.  Visit Diagnosis: Cognitive deficit S/P CVA (cerebrovascular accident)          ADULT SLP TREATMENT - 11/03/14 0001    General Information   Behavior/Cognition Alert;Pleasant mood   HPI Pt with CVA and cognitive-communication deficits resulting.   Treatment Provided   Treatment provided Cognitive-Linquistic   Cognitive-Linquistic Treatment   Treatment focused on Cognition   Skilled Treatment Divided attention with deduction puzzle while naming bills and coins for given amouts. Pt 80% with money task as she required repetition for detailed ammounts. Deduction tasks with  85% accuracy and rare minimal cues. Divide attention between generating items for complex categories and alphabetizing letters in 5 letter words  with rare minimal cues. She benfitted from writing down words at times. 85% accuracy with this. Mrs. Briguglio stated that she is doing well at home, managing cooking and daily chores with some extended time.  Pt has agreed to d/c ST .   Assessment / Recommendations / Plan   Plan Discharge SLP treatment due to (comment)   Progression Toward Goals   Progression toward goals Goals met, education completed, patient discharged from [facility] - 11/03/14 0931    Education provided Yes   Education Details compensatory strategies to reduce impulisivity and assist her attention   Person(s) Educated Patient   Methods Explanation;Demonstration   Comprehension Verbalized understanding          SLP Short Term Goals - 11/03/14 0934    SLP SHORT TERM GOAL #1   Title alternate attention between two mod complex tasks with 85% success    Time 4   Period Weeks   Status Achieved   SLP SHORT TERM GOAL #2   Title pt will demo emergent awareness in therarpy sessions 90%   Period Weeks   Status Achieved          SLP Long Term Goals - 11/03/14 0934    SLP LONG TERM GOAL #1   Title pt will verbalize appropriate compensations for attention   Time 8   Status Achieved   SLP LONG TERM GOAL #2   Title pt will divide attention in mod difficult linguistic tasks for 15 minutes with 85% each task   Time 8   Period Weeks   Status Achieved   SLP LONG TERM GOAL #3   Title pt will alternate attention between two complex cognitive linguistic tasks  with 90% success in both   Time 8   Period Weeks   Status Achieved          Plan - 11/22/2014 0932    Clinical Impression Statement Pt has reported no difficulties with daily cogntive tasks. She reports she is cooking and cleaning with success. She also reports using compensations successfully. Recommend d/c from [facility] Frequency 2x / week   Treatment/Interventions Patient/family education;Compensatory strategies;Internal/external aids;Environmental controls;SLP instruction and feedback;Cognitive reorganization;Functional tasks   Potential to Achieve Goals Good   Consulted and Agree with Plan of Care Patient          G-Codes - 11/22/2014 0935    Functional Assessment Tool Used NOMS   Functional Limitations Attention   Attention Goal  Status [phone]) At least 1 percent but less than 20 percent impaired, limited or restricted   Attention Discharge Status (T7322) At least 1 percent but less than 20 percent impaired, limited or restricted      Problem List Patient Active Problem List   Diagnosis Date Noted  . 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 HYPERCHOLESTEROLEMIA 11/18/2008  . DEPRESSION 11/18/2008  . IRRITABLE BOWEL SYNDROME 11/18/2008  . CIRCUMSCRIBED SCLERODERMA 11/18/2008  . OSTEOARTHRITIS 11/18/2008  . OSTEOPOROSIS 11/18/2008  . MIGRAINES, HX OF 11/18/2008       SPEECH THERAPY DISCHARGE SUMMARY  Visits from Start of Care: 7  Current functional level related to goals / functional outcomes: See goals above   Remaining deficits: Attention, impulisive   Education / Equipment: Compensations for attention and impulisivity Plan: Patient agrees to discharge.  Patient goals were met. Patient is being discharged due to meeting the stated rehab goals.  ?????                                         Josee Speece, Annye Rusk 11-22-14, 9:40 AM  Georgiann Hahn, [place], CCC-SLP November 22, 2014 9:41 AM Phone: [phone] Fax: [phone]

## 2014-11-03 NOTE — Patient Instructions (Signed)
Strengthening: Resisted Flexion   Hold tubing with each arm at side. Pull forward and up. Move shoulder through pain-free range of motion. Repeat _10-20___ times per set. Do __1__ sets per session. Do 1____ sessions per day.    Strengthening: Resisted Extension   Hold tubing in each hand, arm forward. Pull arm back, elbow straight. Repeat 10-20 times per set. Do _1___ sets per session. Do _1___ sessions per day.   Resisted Horizontal Abduction: Bilateral   Sit or stand, tubing in both hands, arms out in front. Keeping arms straight, pinch shoulder blades together and stretch arms out. Repeat _10-20___ times per set. Do ___1_ sets per session. Do __1__ sessions per day.   Elbow Flexion: Resisted   With tubing wrapped around one fist and other end secured under foot, curl arm up as far as possible. Repeat ___10-20_ times per set. Do __1__ sets per session. Do __1__ sessions per day.    Elbow Extension: Resisted   Sit in chair with resistive band secured at armrest and right elbow bent. Straighten elbow. Repeat _10-20___ times per set. Do _1___ sets per session. Do _1___ sessions per day.   Copyright  VHI. All rights reserved.

## 2014-11-03 NOTE — Patient Instructions (Signed)
Pt to continue cognitive activities at home. She is aware and in agreement.

## 2014-11-03 NOTE — Therapy (Signed)
Occupational Therapy Treatment  Patient Details  Name: Kloey Cazarez MRN: [id number] Date of Birth: 06/27/1946  Encounter Date: 11/03/2014      OT End of Session - 11/03/14 1216    Visit Number 4  4/10   Number of Visits 17   Date for OT Re-Evaluation 12/01/14   Authorization Type UHC MCR g code needed   OT Start Time 0935   OT Stop Time 1015   OT Time Calculation (min) 40 min   Equipment Utilized During Treatment red theraband   Activity Tolerance Patient tolerated treatment well   Behavior During Therapy WFL for tasks assessed/performed      Past Medical History  Diagnosis Date  . Migraines   . Osteoarthritis   . Circumscribed scleroderma   . Hyperlipidemia   . Depression   . IBS (irritable bowel syndrome)   . Diverticulosis   . Anxiety   . Memory loss   . Brain bleed   . 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 Tubal ligation    . Broken ankle Left     There were no vitals taken for this visit.  Visit Diagnosis:  Cognitive deficit S/P CVA (cerebrovascular accident)  Weakness due to cerebrovascular accident      Subjective Assessment - 11/03/14 1213    Currently in Pain? Yes   Pain Score 3    Pain Location Generalized   Pain Type Chronic pain   Pain Relieving Factors Chronic arthritis which improves as day goes on   Multiple Pain Sites No              OT Education - 11/03/14 1215    Education provided Yes   Education Details theraband red HEP   Person(s) Educated Patient   Methods Explanation;Demonstration   Comprehension Verbalized understanding;Returned demonstration;Verbal cues required  Pt was istructed to monitor HR with activity and to discontinue if HR increases significantly, headache or SOB. Pt verbalized understanding.              Plan - 11/03/14 1217    Clinical Impression Statement Pt. was instructed in red theraband HEP. 10-20 reps each, for  bilateral UE's. Pt's HR was monitored closely,( prior to exercise 63 BPM, following 67 BPM). Pt was instructed to monitor HR when exercising at home and to stop exercise if HR increases significantly, headache or SOB. Pt verbalized understanding. Deductive reasoning puzzle with mod difficulty/ v.c. Pt continues to demonstrate impulsivity.   Rehab Potential Good   Clinical Impairments Affecting Rehab Potential cognition   OT Frequency 2x / week   OT Duration 8 weeks   OT Treatment/Interventions Self-care/ADL training;Moist Heat;Fluidtherapy;DME and/or AE instruction;Patient/family education;Therapeutic exercises;Therapeutic exercise;Therapeutic activities;Neuromuscular education;Cognitive remediation/compensation;Visual/perceptual remediation/compensation;Manual Therapy;Energy conservation   Plan reinforce HEP        Problem List Patient Active Problem List   Diagnosis Date Noted  . 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 HYPERCHOLESTEROLEMIA 11/18/2008  . DEPRESSION 11/18/2008  . IRRITABLE BOWEL SYNDROME 11/18/2008  . CIRCUMSCRIBED SCLERODERMA 11/18/2008  . OSTEOARTHRITIS 11/18/2008  . OSTEOPOROSIS 11/18/2008  . MIGRAINES, HX OF 11/18/2008                                             Theone Murdoch, OTR/L [phone] Phone: [phone] 1:06 PM 11/03/2014  [name],[name] 11/03/2014, 1:06 PM

## 2014-11-11 ENCOUNTER — Ambulatory Visit: Payer: Medicare Other | Admitting: Occupational Therapy

## 2014-11-11 ENCOUNTER — Encounter: Payer: Medicare Other | Admitting: Speech Pathology

## 2014-11-12 ENCOUNTER — Encounter: Payer: Self-pay | Admitting: Neurology

## 2014-11-12 MED ORDER — AMLODIPINE BESYLATE 5 MG PO TABS: ORAL_TABLET | ORAL | Status: DC

## 2014-11-12 NOTE — Telephone Encounter (Signed)
i refilled at 5 mg norvasc, 30 a month, to [facility] . cd

## 2014-11-13 ENCOUNTER — Ambulatory Visit: Payer: Medicare Other | Attending: Internal Medicine | Admitting: Occupational Therapy

## 2014-11-13 ENCOUNTER — Encounter: Payer: Medicare Other | Admitting: Speech Pathology

## 2014-11-13 VITALS — HR 85

## 2014-11-13 DIAGNOSIS — I69951 Hemiplegia and hemiparesis following unspecified cerebrovascular disease affecting right dominant side: Secondary | ICD-10-CM | POA: Insufficient documentation

## 2014-11-13 DIAGNOSIS — R41841 Cognitive communication deficit: Secondary | ICD-10-CM | POA: Diagnosis not present

## 2014-11-13 DIAGNOSIS — IMO0002 Reserved for concepts with insufficient information to code with codable children: Secondary | ICD-10-CM

## 2014-11-13 DIAGNOSIS — I6991 Cognitive deficits following unspecified cerebrovascular disease: Secondary | ICD-10-CM | POA: Diagnosis present

## 2014-11-13 DIAGNOSIS — I69319 Unspecified symptoms and signs involving cognitive functions following cerebral infarction: Secondary | ICD-10-CM

## 2014-11-13 NOTE — Therapy (Signed)
[facility] [address] [place], [place], [address] Phone: [phone]   Fax:  [phone]  Occupational Therapy Treatment  Patient Details  Name: Heather Lindsey MRN: [id number] Date of Birth: 1946-06-28  Encounter Date: 11/13/2014      OT End of Session - 11/13/14 1648    Visit Number 5   Number of Visits 17   Date for OT Re-Evaluation 12/01/14   Authorization Type UHC MCR g code needed   OT Start Time 1317   OT Stop Time 1400   OT Time Calculation (min) 43 min   Equipment Utilized During Treatment red theraband   Activity Tolerance Patient tolerated treatment well   Behavior During Therapy [facility] for tasks assessed/performed      Past Medical History  Diagnosis Date  . Migraines   . Osteoarthritis   . Circumscribed scleroderma   . Hyperlipidemia   . Depression   . IBS (irritable bowel syndrome)   . Diverticulosis   . Anxiety   . Memory loss   . Brain bleed   . 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 Tubal ligation    . Broken ankle Left     Pulse 85  SpO2 85%  Visit Diagnosis:  Cognitive deficit S/P CVA (cerebrovascular accident)  Weakness due to cerebrovascular accident      Subjective Assessment - 11/13/14 1320    Currently in Pain? Yes   Pain Location Back   Pain Type Chronic pain   Pain Onset More than a month ago   Multiple Pain Sites No              OT Education - 11/13/14 1326    Education provided Yes   Education Details Reviewed theraband HEP   Person(s) Educated Patient   Methods Explanation   Comprehension Verbalized understanding;Returned demonstration          OT Short Term Goals - 11/13/14 1352    OT SHORT TERM GOAL #1   Title I with HEP   Status Achieved   OT SHORT TERM GOAL #2   Title Pt will perform cooking with supervision/ min verbal cues   Status Achieved   OT SHORT TERM GOAL #3   Title  Pt. will perform home management activities modified independently in a reasonable amount of time.   Status Achieved   OT SHORT TERM GOAL #4   Title Pt will perform functional activities in a moderately distracting environment while maintaining selective attention x 20 mins.   Status Achieved          OT Long Term Goals - 11/13/14 1356    OT LONG TERM GOAL #1   Title Pt will verbalize understanding of the risk factors and warning signs/ symptoms of CVA.   Status Achieved   OT LONG TERM GOAL #2   Title Pt will perform cooking tasks at a modified indpendent level demonstrating good safety awareness.   Status On-going   OT LONG TERM GOAL #3   Title Pt will demonstrate divided attention for a physical and cognitive task at 95% accuracy in prep for driving.   Status On-going          Plan - 11/13/14 1400    Clinical Impression Statement Reviewed red therband HEP, 15 reps each. Multi tasking for physical and cognitive task simultaneously, with grossly 90%-95% accuracy. Discussed progress towards goals and plans to d/c next week. Moderate complex  problem solving for word problems, 80% accuracy. UBE x 5 mins level 1.   Rehab Potential Good   Clinical Impairments Affecting Rehab Potential cognition   OT Frequency 2x / week   OT Duration 8 weeks   OT Treatment/Interventions Self-care/ADL training;Moist Heat;Fluidtherapy;DME and/or AE instruction;Patient/family education;Therapeutic exercises;Therapeutic exercise;Therapeutic activities;Neuromuscular education;Cognitive remediation/compensation;Visual/perceptual remediation/compensation;Manual Therapy;Energy conservation   Plan check goals, anticipate d/c next week, G code, [place] continue theraband   Consulted and Agree with Plan of Care Patient                               Problem List Patient Active Problem List   Diagnosis Date Noted  . 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 HYPERCHOLESTEROLEMIA 11/18/2008  . DEPRESSION 11/18/2008  . IRRITABLE BOWEL SYNDROME 11/18/2008  . CIRCUMSCRIBED SCLERODERMA 11/18/2008  . OSTEOARTHRITIS 11/18/2008  . OSTEOPOROSIS 11/18/2008  . MIGRAINES, HX OF 11/18/2008    Nyilah Kight 11/13/2014, 4:59 PM  Theone Murdoch, OTR/L [phone] Phone: [phone] 4:59 PM 11/13/2014

## 2014-11-18 ENCOUNTER — Ambulatory Visit: Payer: Medicare Other

## 2014-11-18 ENCOUNTER — Ambulatory Visit: Payer: Medicare Other | Admitting: Occupational Therapy

## 2014-11-18 DIAGNOSIS — IMO0002 Reserved for concepts with insufficient information to code with codable children: Secondary | ICD-10-CM

## 2014-11-18 DIAGNOSIS — I69319 Unspecified symptoms and signs involving cognitive functions following cerebral infarction: Secondary | ICD-10-CM

## 2014-11-18 DIAGNOSIS — I69951 Hemiplegia and hemiparesis following unspecified cerebrovascular disease affecting right dominant side: Secondary | ICD-10-CM | POA: Diagnosis not present

## 2014-11-18 NOTE — Therapy (Signed)
[facility] [address] [place], [place], [address] Phone: [phone]   Fax:  [phone]  Occupational Therapy Treatment  Patient Details  Name: Heather Lindsey MRN: [id number] Date of Birth: [birth year]  Encounter Date: December 10, 2014      OT End of Session - 10-Dec-2014 1236    Visit Number 6   Number of Visits 17   Authorization Type [employer] MCR g code needed   OT Start Time 1150   OT Stop Time 1230   OT Time Calculation (min) 40 min   Equipment Utilized During Treatment red theraband   Activity Tolerance Patient tolerated treatment well   Behavior During Therapy [facility] for tasks assessed/performed      Past Medical History  Diagnosis Date  . Migraines   . Osteoarthritis   . Circumscribed scleroderma   . Hyperlipidemia   . Depression   . IBS (irritable bowel syndrome)   . Diverticulosis   . Anxiety   . Memory loss   . Brain bleed   . 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 Tubal ligation    . Broken ankle Left     There were no vitals taken for this visit.  Visit Diagnosis:  Weakness due to cerebrovascular accident  Cognitive deficit S/P CVA (cerebrovascular accident)      Subjective Assessment - 12-10-2014 1152    Currently in Pain? Yes   Pain Score 3    Pain Location Back   Pain Descriptors / Indicators Aching   Pain Type Chronic pain   Pain Onset More than a month ago   Pain Frequency Intermittent              OT Education - December 10, 2014 1235    Education provided Yes   Education Details Reviewed theraband HEP, recommended use of compenations for cognition and strategies to keep thinking skills sharp   Person(s) Educated Patient   Methods Explanation   Comprehension Verbalized understanding            OT Long Term Goals - 12/10/14 1154    OT LONG TERM GOAL #1   Title Pt will verbalize understanding of the risk factors and  warning signs/ symptoms of CVA.   Status Achieved   OT LONG TERM GOAL #2   Title Pt will perform cooking tasks at a modified indpendent level demonstrating good safety awareness.   Status Achieved   OT LONG TERM GOAL #3   Title Pt will demonstrate divided attention for a physical and cognitive task at 95% accuracy in prep for driving.   Baseline met for basic in the clinic   Status Achieved          Plan - 12-10-2014 1237    Clinical Impression Statement Pt made good progress and has achieved long term goals. Pt agrees with d/c.   Rehab Potential Good   Clinical Impairments Affecting Rehab Potential cognition   OT Frequency 2x / week   OT Duration 8 weeks   OT Treatment/Interventions Self-care/ADL training;Moist Heat;Fluidtherapy;DME and/or AE instruction;Patient/family education;Therapeutic exercises;Therapeutic exercise;Therapeutic activities;Neuromuscular education;Cognitive remediation/compensation;Visual/perceptual remediation/compensation;Manual Therapy;Energy conservation   Plan Discharge OT, Therapist recommends pt pt. seek clearance form MD prior to return to driving.   OT Home Exercise Plan continue theraband   Consulted and Agree with Plan of Care Patient          G-Codes - 10-Dec-2014 1246    Functional Assessment Tool Used self  care, cooks modified independently   Functional Limitation Self care   Self Care Goal Status [phone]) At least 1 percent but less than 20 percent impaired, limited or restricted   Self Care Discharge Status [phone]) At least 1 percent but less than 20 percent impaired, limited or restricted       Checked progress towards goals. Pt agrees with plans to discharge today, all LTG's met. Discussed strategies for cognitive compensation, and ways to keep thinking skills sharp.  Pt performed a basic physical and cognitive task simultaneously with grossly 95% accuracy within clinic.  Reviewed theraband HEP, 15 reps each for bilateral UE's.       OCCUPATIONAL THERAPY DISCHARGE SUMMARY   Current functional level related to goals / functional outcomes:Pt met all long and short term goals.   Remaining deficits:Pt continues to demonstrate mild cognitive deficits with decreased divided attention and occasional impulsivity. Therapist recommends pt seek clearance from MD prior to return to driving.(Therapist has recommended that even if cleared for return to driving she should practice driving with a family member initially.)   Education / Equipment: Pt was educated regarding HEP, cognitive strategies and ways to keep thinking skills sharp. Pt verbalized understanding of all education. Plan: Patient agrees to discharge.  Patient goals were partially met. Patient is being discharged due to meeting the stated rehab goals.  ?????         .         Problem List Patient Active Problem List   Diagnosis Date Noted  . 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 HYPERCHOLESTEROLEMIA 11/18/2008  . DEPRESSION 11/18/2008  . IRRITABLE BOWEL SYNDROME 11/18/2008  . CIRCUMSCRIBED SCLERODERMA 11/18/2008  . OSTEOARTHRITIS 11/18/2008  . OSTEOPOROSIS 11/18/2008  . MIGRAINES, HX OF 11/18/2008    Arlester Keehan 11/18/2014, 12:47 PM  Theone Murdoch, OTR/L [phone] Phone: [phone] 5:16 PM 11/18/2014

## 2014-11-20 ENCOUNTER — Encounter: Payer: Medicare Other | Admitting: Occupational Therapy

## 2014-11-20 ENCOUNTER — Encounter: Payer: Medicare Other | Admitting: Speech Pathology

## 2014-11-20 ENCOUNTER — Telehealth: Payer: Self-pay | Admitting: Occupational Therapy

## 2014-11-27 ENCOUNTER — Ambulatory Visit (INDEPENDENT_AMBULATORY_CARE_PROVIDER_SITE_OTHER): Payer: Medicare Other | Admitting: Adult Health

## 2014-11-27 ENCOUNTER — Encounter: Payer: Self-pay | Admitting: Adult Health

## 2014-11-27 VITALS — BP 117/66 | HR 72 | Temp 97.6°F | Ht 63.0 in | Wt 140.0 lb

## 2014-11-27 DIAGNOSIS — E871 Hypo-osmolality and hyponatremia: Secondary | ICD-10-CM

## 2014-11-27 DIAGNOSIS — Z8679 Personal history of other diseases of the circulatory system: Secondary | ICD-10-CM

## 2014-11-27 NOTE — Progress Notes (Signed)
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 She returns today for follow-up. The patient had a MRA that was normal. The patient was hyponatremic and therefore was discharged from the hospital with sodium tablets. The patient was given propranolol for her migraines. She reports that she has not had any migraines since the last visit. Patient states that she has not had any memory issues.-" no more than normal." The patient states that she has had a good appetite. She has also been experience certain craving- she craves [place] and Tai food. Patient states that she had confusion with the accident but not since then. Denies any weakness or numbness in the extremities. Overall she feels that she is doing well.  No Heather medical issues since last seen.    HISTORY 10/01/14 [facility]): Heather Lindsey is a 68 y.o. female, who is seen here as a referral  from Dr. Dagmar Lindsey for a stroke follow up from Heather Lindsey;[name] had reportedly no history of any neurologic abnormalities when she suffered an unusual headache that she first thought was a migraine. She was swimming with her 49-year-old grandson and playing and when she went back to the bedroom, took a maxalt -  the headache became quite severe. She became confused and violently nauseated and vomited,was brought by Private car to [facility]. At the time the emergency room described a patient that appeared somewhat delirious and did not appear aware or coordinated.A CT obtained documented a large intraparenchymal hematoma in the right frontal lobe associated with surrounding mass effect. There was midline shift noted. There was no evidence of an acute infarction no history of an aneurysm or malformation of the vessel. The patient reports that 3 days prior she had an accident  at home then she when a vase ,rather heavy glass object fell on the top of her head from a top cupboard.  During hospitalization she remained confused she also developed hypernatremia which can be attributed to live brain hematoma in the setting of an Chambers more likely and as I ADH. Fluid restrictions were given and the patient to monitor her sodium intake. She was then given sodium tablets at the time of her discharge. She was discharged after her the patient remained in the hospital from 08/21/2014 to 08/26/2014. She remained hyponatremic she was given steroids. And her discharge MRI still shows a larger lesion in the right frontal lobe parenchyma with midline shift mass affect and edema.Her history of migraines was discussed today, Maxalt and other triptans are not indicated. She remains with right upper extremity numbness tingling, and she is had this kind of symptoms never before the Heather Lindsey. The cause/ origin of the bleed is still unknown.  REVIEW OF SYSTEMS: Out of a complete 14 system review of symptoms, the patient complains only of the following symptoms, and all other reviewed systems are negative.  Appetite change Cough Daytime sleepiness Joint pain, back pain, aching muscles Depression, nervous/anxious  ALLERGIES: No Known Allergies  HOME MEDICATIONS: Outpatient Prescriptions Prior to Visit  Medication Sig Dispense Refill  . acetaminophen (TYLENOL) 500 MG tablet Take 500 mg by mouth every 6 (six) hours as needed.      . Alum Hydroxide-Mag Carbonate (GAVISCON PO) Take by mouth as needed.      Marland Kitchen amLODipine (NORVASC) 5 MG tablet Once a day  po, watch for puffy feet ! 90 tablet 3  . calcium carbonate (TUMS - DOSED IN MG ELEMENTAL CALCIUM) 500 MG chewable tablet Chew 1 tablet by mouth as needed.      . Calcium Citrate-Vitamin D 500-250 MG-UNIT PACK Take 1 capsule by mouth daily.      . celecoxib (CELEBREX) 200 MG capsule Take 200 mg by mouth daily.      . Cholecalciferol (VITAMIN D) 2000 UNITS  CAPS Take 1 capsule by mouth. 3 time a week     . FLUoxetine (PROZAC) 20 MG capsule Take 20 mg by mouth daily.      . Folic Acid-Vit P4-DIY M41 (FOLGARD PO) Take 1 capsule by mouth daily.      Marland Kitchen loratadine (CLARITIN) 10 MG tablet Take 10 mg by mouth as needed.      . Multiple Vitamins-Minerals (CENTRUM PO) Take 1 capsule by mouth daily.      Marland Kitchen omeprazole (PRILOSEC) 20 MG capsule Take 20 mg by mouth daily.      Marland Kitchen oxyCODONE (OXY IR/ROXICODONE) 5 MG immediate release tablet Take 5 mg by mouth at bedtime as needed.     . propranolol ER (INDERAL LA) 60 MG 24 hr capsule Take one at night po. 30 capsule 5  . rizatriptan (MAXALT) 10 MG tablet Take 10 mg by mouth as needed. May repeat in 2 hours if needed     . sodium chloride 1 G tablet Take 1 g by mouth once.     . traMADol (ULTRAM) 50 MG tablet Take 100 mg by mouth 2 (two) times daily.      No facility-administered medications prior to visit.    PAST MEDICAL HISTORY: Past Medical History  Diagnosis Date  . Migraines   . Osteoarthritis   . Circumscribed scleroderma   . Hyperlipidemia   . Depression   . IBS (irritable bowel syndrome)   . Diverticulosis   . Anxiety   . Memory loss   . Brain bleed   . 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 Tubal ligation    . Broken ankle Left     FAMILY HISTORY: Family History  Problem Relation Age of Onset  . Liver cancer Paternal Aunt   . Heart disease Father   . Prostate cancer Paternal Grandfather   . Colon cancer Neg Hx     SOCIAL HISTORY: History   Social History  . Marital Status: Married    Spouse Name: N/A    Number of Children: N/A  . Years of Education: N/A   Occupational History  . Retired     Social History Main Topics  . Smoking status: Former Research scientist (life sciences)  . Smokeless tobacco: Never Used     Comment: Quit now 40 years   . Alcohol Use: Yes     Comment: glass of wine daily     . Drug Use: No  . Sexual Activity: Not on file   Other Topics Concern  . Not on file   Social History Narrative   Daily caffeine, 2 glasses daily,   Right handed, Married, 2 sons. Retired. College.      PHYSICAL EXAM  Filed Vitals:   11/27/14 1353  BP: 117/66  Pulse: 72  Temp: 97.6 F (36.4 C)  TempSrc: Oral  Height: '5\' 3"'  (1.6 m)  Weight: 140 lb (63.504 kg)   Body mass index is 24.81 kg/(m^2).  Generalized: Well developed, in no acute distress   Neurological examination  Mentation: Alert oriented to time, place, history taking. Follows all commands speech and language fluent. MOCA 30/30. Cranial nerve II-XII: Pupils were equal round reactive to light. Extraocular movements were full, visual field were full on confrontational test. Facial sensation and strength were normal. Uvula tongue midline. Head turning and shoulder shrug  were normal and symmetric. Motor: The motor testing reveals 5 over 5 strength of all 4 extremities. Good symmetric motor tone is noted throughout.  Sensory: Sensory testing is intact to soft touch on all 4 extremities. No evidence of extinction is noted.  Coordination: Cerebellar testing reveals good finger-nose-finger and heel-to-shin bilaterally.  Gait and station: Gait is normal. Tandem gait is unsteady. Romberg is negative. No drift is seen.  Reflexes: Deep tendon reflexes are symmetric and normal bilaterally.  Marland Kitchen   DIAGNOSTIC DATA (LABS, IMAGING, TESTING) - I reviewed patient records, labs, notes, testing and imaging myself where available.     ASSESSMENT AND PLAN 68 y.o. 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 here with:  1. History of intracranial hemorrhage 2. Migraines  The patient has remained  stable since the last visit. No episodes of confusion. She continues to take propranolol for migraines. She states she has not had any migraines since the last visit. 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 Her physical exam today is unremarkable. Her MOCA score is 30/30. Her blood pressure is a normal range today. I have consulted with Dr. Jaynee Eagles and from our standpoint the patient can resume driving. I have advised the patient that if she has any confusional episodes or weakness in any of the extremities she is to let us know or go to the emergency room. I also reviewed the signs and symptoms of a stroke. The patient will follow up in 3 months with Dr. Brett Fairy.  Ward Givens, MSN, NP-C 11/27/2014, 2:03 PM [facility] [address] [place], [place] [address] [phone]  Note: This document was prepared with digital dictation and possible smart phrase technology. Any transcriptional errors that result from this process are unintentional.

## 2014-11-27 NOTE — Patient Instructions (Addendum)
Continue taking propranolol for migraines.  I will check blood work today to look at your sodium level.  If your have any confusional episodes or weakness in any extremities please let us know or go to the ED You may resume driving.

## 2014-11-28 LAB — BASIC METABOLIC PANEL
BUN/Creatinine Ratio: 31 — ABNORMAL HIGH (ref 11–26)
BUN: 24 mg/dL (ref 8–27)
CALCIUM: 9.3 mg/dL (ref 8.7–10.3)
CO2: 23 mmol/L (ref 18–29)
CREATININE: 0.78 mg/dL (ref 0.57–1.00)
Chloride: 98 mmol/L (ref 97–108)
GFR calc Af Amer: 91 mL/min/{1.73_m2} (ref >59)
GFR, EST NON AFRICAN AMERICAN: 79 mL/min/{1.73_m2} (ref >59)
GLUCOSE: 80 mg/dL (ref 65–99)
POTASSIUM: 4.9 mmol/L (ref 3.5–5.2)
SODIUM: 136 mmol/L (ref 134–144)

## 2014-11-28 NOTE — Progress Notes (Signed)
I agree with the assessment and plan as directed by NP .The patient is known to me .   Misheel Gowans, MD  

## 2014-11-28 NOTE — Progress Notes (Signed)
Called patient, LM advising of normal labs.

## 2014-12-19 ENCOUNTER — Encounter: Payer: Self-pay | Admitting: Neurology

## 2015-01-12 ENCOUNTER — Encounter: Payer: Self-pay | Admitting: Neurology

## 2015-02-26 ENCOUNTER — Ambulatory Visit (INDEPENDENT_AMBULATORY_CARE_PROVIDER_SITE_OTHER): Payer: Medicare Other | Admitting: Neurology

## 2015-02-26 ENCOUNTER — Encounter: Payer: Self-pay | Admitting: Neurology

## 2015-02-26 VITALS — BP 137/73 | HR 61 | Resp 12 | Wt 142.0 lb

## 2015-02-26 DIAGNOSIS — I612 Nontraumatic intracerebral hemorrhage in hemisphere, unspecified: Secondary | ICD-10-CM | POA: Diagnosis not present

## 2015-02-26 DIAGNOSIS — I629 Nontraumatic intracranial hemorrhage, unspecified: Secondary | ICD-10-CM

## 2015-02-26 NOTE — Progress Notes (Signed)
PATIENT: Heather Lindsey DOB: [birth year]  REASON FOR VISIT: follow up HISTORY FROM: patient  HISTORY OF PRESENT ILLNESS: Ms. Heather Lindsey is a 69 year old female with a history of intraparenchymal hematoma in the right frontal lobe. She returns today for follow-up. The patient had an cerebral  MRA that was normal.  The patient was hyponatremic and therefore was discharged from the hospital with sodium tablets. The patient was given propranolol for her migraines. She reports that she has not had any migraines since the 10-21 -15 date.  .  Patient states that she has not had any memory issues.-" no more than normal." The patient states that she has had a good appetite. She has also been experience certain craving- she craves [place] and [place] food. Patient states that she had confusion with the accident but not since then. Denies any weakness or numbness in the extremities. Overall she feels that she is doing well.  No new medical issues since last seen. Heather Lindsey  had a question about the causes of migraine and intracerebral hemorrhage. I explained to her that the free I went Be an irritant to the nerve itself and can cause especially subarachnoid a vessel spasm. The symptoms are migrainous there are vasogenic headaches. My nurse performed today a Montral cognitive assessment test with  Heather Lindsey  who scored 29 out of 30 points. She missed only 1 out of 5 recall words. This is an excellent result. She has noted a reduced depth perception, but also a lack of left peripheral vision. She gets startled when a car overtakes her from the right. No difficulties with reading. No tingling.  Her sleepiness is more pronounced, but she has not had snoring or apnea. Her husband sleeps in the same room, and he snores has insomnia.      HISTORY 10/01/14 [facility]): Heather Lindsey is a 69 y.o. female, who is seen here as a referral  from Dr. Dagmar Hait for a stroke follow up from [facility]; Heather Lindsey had reportedly no history of  any neurologic abnormalities when she suffered an unusual headache -she first thought was a migraine. She was swimming with her 17-year-old grandson and playing and when she went back to the bedroom, took a maxalt -  the headache became quite severe. She became confused and violently nauseated and vomited,was brought by Private car to [facility]. At the time the emergency room described a patient that appeared somewhat delirious and did not appear aware or coordinated.A CT obtained documented a large intraparenchymal hematoma in the right frontal lobe associated with surrounding mass effect. There was midline shift noted. There was no evidence of an acute infarction no history of an aneurysm or malformation of the vessel. The patient reports that 3 days prior she had an accident at home then she when a vase ,rather heavy glass object fell on the top of her head from a top cupboard.  During hospitalization she remained confused she also developed hypernatremia which can be attributed to live brain hematoma in the setting of an [place] more likely and as I ADH.  Fluid restrictions were given and the patient to monitor her sodium intake. She was then given sodium tablets at the time of her discharge. She was discharged after her the patient remained in the hospital from 08/21/2014 to 08/26/2014. She remained hyponatremic she was given steroids. And her discharge MRI still shows a larger lesion in the right frontal lobe parenchyma with midline shift mass affect and edema.Her history  of migraines was discussed today, Maxalt and other triptans are not indicated. She remains with right upper extremity numbness tingling, and she is had this kind of symptoms never before the [place]. The cause/ origin of the bleed is still unknown.  REVIEW OF SYSTEMS: Out of a complete 14 system review of symptoms, the patient complains only of the following symptoms, and all other reviewed systems are negative.  Appetite  change Cough Vision restriction . Sleepiness in daytime.  Joint pain, back pain, aching muscles  ALLERGIES: No Known Allergies  HOME MEDICATIONS: Outpatient Prescriptions Prior to Visit  Medication Sig Dispense Refill  . acetaminophen (TYLENOL) 500 MG tablet Take 500 mg by mouth every 6 (six) hours as needed.      . Alum Hydroxide-Mag Carbonate (GAVISCON PO) Take by mouth as needed.      Marland Kitchen amLODipine (NORVASC) 5 MG tablet Once a day po, watch for puffy feet ! 90 tablet 3  . calcium carbonate (TUMS - DOSED IN MG ELEMENTAL CALCIUM) 500 MG chewable tablet Chew 1 tablet by mouth as needed.      . Calcium Citrate-Vitamin D 500-250 MG-UNIT PACK Take 1 capsule by mouth daily.      . celecoxib (CELEBREX) 200 MG capsule Take 200 mg by mouth daily.      . Cholecalciferol (VITAMIN D) 2000 UNITS CAPS Take 1 capsule by mouth. 3 time a week     . FLUoxetine (PROZAC) 20 MG capsule Take 20 mg by mouth daily.      . Folic Acid-Vit U1-LKG M01 (FOLGARD PO) Take 1 capsule by mouth daily.      Marland Kitchen loratadine (CLARITIN) 10 MG tablet Take 10 mg by mouth as needed.      . Multiple Vitamins-Minerals (CENTRUM PO) Take 1 capsule by mouth daily.      Marland Kitchen omeprazole (PRILOSEC) 20 MG capsule Take 20 mg by mouth daily.      Marland Kitchen oxyCODONE (OXY IR/ROXICODONE) 5 MG immediate release tablet Take 5 mg by mouth at bedtime as needed.     . propranolol ER (INDERAL LA) 60 MG 24 hr capsule Take one at night po. 30 capsule 5  . traMADol (ULTRAM) 50 MG tablet Take 100 mg by mouth 2 (two) times daily.     . rizatriptan (MAXALT) 10 MG tablet Take 10 mg by mouth as needed. May repeat in 2 hours if needed     . sodium chloride 1 G tablet Take 1 g by mouth once.      No facility-administered medications prior to visit.    PAST MEDICAL HISTORY: Past Medical History  Diagnosis Date  . Migraines   . Osteoarthritis   . Circumscribed scleroderma   . Hyperlipidemia   . Depression   . IBS (irritable bowel syndrome)   . Diverticulosis    . Anxiety   . Memory loss   . Brain bleed   . 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 Tubal ligation    . Broken ankle Left     FAMILY HISTORY: Family History  Problem Relation Age of Onset  . Liver cancer Paternal Aunt   . Heart disease Father   . Prostate cancer Paternal Grandfather   . Colon cancer Neg Hx     SOCIAL HISTORY: History   Social History  . Marital Status: Married    Spouse Name: N/A  . Number of Children: N/A  .  Years of Education: N/A   Occupational History  . Retired     Social History Main Topics  . Smoking status: Former Research scientist (life sciences)  . Smokeless tobacco: Never Used     Comment: Quit now 40 years   . Alcohol Use: Yes     Comment: glass of wine daily   . Drug Use: No  . Sexual Activity: Not on file   Other Topics Concern  . Not on file   Social History Narrative   Daily caffeine, 2 glasses daily,   Right handed, Married, 2 sons. Retired. College.      PHYSICAL EXAM  Filed Vitals:   02/26/15 1512  BP: 137/73  Pulse: 61  Resp: 12  Weight: 142 lb (64.411 kg)   Body mass index is 25.16 kg/(m^2).  Generalized: Well developed, in no acute distress   Neurological examination  Mentation: Alert oriented to time, place, history taking. Follows all commands speech and language fluent. MOCA 29/30. Cranial nerve : Pupils were equal round reactive to light.  Eye movements were full, visual field were full on confrontational test. Facial sensation and strength were normal. Uvula tongue midline. Head turning and shoulder shrug  were normal and symmetric. Motor: The motor testing reveals 5 over 5 strength of all 4 extremities. Good symmetric motor tone is noted throughout.  Sensory: Sensory testing is intact to soft touch on all 4 extremities. No evidence of extinction is noted.  Coordination: Cerebellar testing reveals good finger-nose-finger and  heel-to-shin bilaterally.  Gait and station: Gait is normal. Tandem gait is unsteady. Romberg is negative. No drift is seen.  Reflexes: Deep tendon reflexes are symmetric and normal bilaterally.  Marland Kitchen   DIAGNOSTIC DATA (LABS, IMAGING, TESTING) - I reviewed patient records, labs, notes, testing and imaging myself where available.  Dr. Deon Pilling, ophthalmologist just examined the patient, no vision deficit noted.    ASSESSMENT AND PLAN 69 y.o. 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 here with:  1. History of intracranial hemorrhage 2. Migraines resolved, advised to not take Maxalt any more.   The patient has remained stable since the last visit. No episodes of confusion.  She continues to take propranolol for migraines. She states she has not had any migraines since the last visit. 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 Her physical exam today is unremarkable. Her MOCA score is 39/30. Her blood pressure is a normal range today. I have consulted with Dr. Jaynee Eagles and from our standpoint the patient can resume driving.  I have advised the patient that if she has any confusional episodes or weakness in any of the extremities she is to let us know or go to the emergency room.  I also reviewed agian  the signs and symptoms of a stroke, visit 25 minutes . Marland Kitchen The patient will follow up in 3 months with me, Dr. Brett Fairy.   Mersades Barbaro , MD3/17/2016, 3:33 PM [facility] [address], [place], [place] [address] [phone]

## 2015-03-11 ENCOUNTER — Ambulatory Visit
Admission: RE | Admit: 2015-03-11 | Discharge: 2015-03-11 | Disposition: A | Discharge status: Home / Self Care | Payer: Medicare Other | Source: Ambulatory Visit | Attending: Neurology | Admitting: Neurology

## 2015-03-11 DIAGNOSIS — I612 Nontraumatic intracerebral hemorrhage in hemisphere, unspecified: Secondary | ICD-10-CM

## 2015-03-11 DIAGNOSIS — I629 Nontraumatic intracranial hemorrhage, unspecified: Secondary | ICD-10-CM

## 2015-03-11 MED ORDER — GADOBENATE DIMEGLUMINE 529 MG/ML IV SOLN: 13.0000 mL | Freq: Once | INTRAVENOUS | Status: AC | PRN

## 2015-08-31 ENCOUNTER — Encounter (HOSPITAL_COMMUNITY): Payer: Medicare Other

## 2015-09-02 ENCOUNTER — Ambulatory Visit (INDEPENDENT_AMBULATORY_CARE_PROVIDER_SITE_OTHER): Payer: Medicare Other | Admitting: Neurology

## 2015-09-02 ENCOUNTER — Encounter: Payer: Self-pay | Admitting: Neurology

## 2015-09-02 ENCOUNTER — Other Ambulatory Visit (HOSPITAL_COMMUNITY): Payer: Self-pay | Admitting: *Deleted

## 2015-09-02 VITALS — BP 116/72 | HR 78 | Resp 20 | Ht 64.0 in | Wt 147.5 lb

## 2015-09-02 DIAGNOSIS — I609 Nontraumatic subarachnoid hemorrhage, unspecified: Secondary | ICD-10-CM | POA: Diagnosis not present

## 2015-09-02 NOTE — Progress Notes (Signed)
PATIENT: Javanna Patin DOB: 1946-07-27  REASON FOR VISIT: follow up on brain bleed, frontal lobe.  HISTORY FROM: patient  HISTORY OF PRESENT ILLNESS:  09-02-15  Ms. Caryl Comes is a 69 year old female with a history of intraparenchymal hematoma in the right frontal lobe. She returns today for follow-up. The patient has been doing well in terms of her neurologic function and memory and there has been resolution of the hemorrhage in her follow-up MRIs -the last imaging study was from 03-11-15. [name] does mention that she feels she has a little bit of impulse control issues and feels impatient and fidgety which would fit a frontal lobe location of her hemorrhage. However this has not led to any kind of social behavior conflicts. She's gains she gained 20 pounds and she says that her craving for food is a significant difference in her behavior and feeling , in comparison to prior to the bleed. There remains no weakness or fine motor skill impairment no speech or handwriting change. She always had right/ left confusion.  She no longer takes any steroids and at this time is not on any dopaminergic agonists which could induce appetite.    HISTORY 10/01/14 [facility]): Arantza Darrington is a 69 y.o. female, who is seen here as a referral  from Dr. Dagmar Hait for a stroke follow up from [facility]; [name] had reportedly no history of any neurologic abnormalities when she suffered an unusual headache -she first thought was a migraine. She was swimming with her 77-year-old grandson and playing and when she went back to the bedroom, took a maxalt -  the headache became quite severe. She became confused and violently nauseated and vomited,was brought by Private car to [facility]. At the time the emergency room described a patient that appeared somewhat delirious and did not appear aware or coordinated.A CT obtained documented a large intraparenchymal hematoma in the right frontal lobe associated with  surrounding mass effect. There was midline shift noted. There was no evidence of an acute infarction no history of an aneurysm or malformation of the vessel. The patient reports that 3 days prior she had an accident at home then she when a vase ,rather heavy glass object fell on the top of her head from a top cupboard.  During hospitalization she remained confused she also developed hypernatremia which can be attributed to live brain hematoma in the setting of an [place] more likely and as I ADH.  Fluid restrictions were given and the patient to monitor her sodium intake. She was then given sodium tablets at the time of her discharge. She was discharged after her the patient remained in the hospital from 08/21/2014 to 08/26/2014. She remained hyponatremic she was given steroids. And her discharge MRI still shows a larger lesion in the right frontal lobe parenchyma with midline shift mass affect and edema.Her history of migraines was discussed today, Maxalt and other triptans are not indicated. She remains with right upper extremity numbness tingling, and she is had this kind of symptoms never before the [place]. The cause/ origin of the bleed is still unknown. The patient was hyponatremic and therefore was discharged from the hospital with sodium tablets. The patient was given propranolol for her migraines. She reports that she has not had any migraines since the 10-21 -15 date. The Patient states that she has not had any memory issues.-" no more than normal." The patient states that she has had a good appetite. She has also been experience certain  craving- she craves [place] and [place] food. Patient states that she had confusion with the accident but not since then.  Denies any weakness or numbness in the extremities. Overall she feels that she is doing well.  No new medical issues since last seen. Mrs. Cronk  had a question about the causes of migraine and intracerebral hemorrhage,  especially subarachnoid bleeds can  cause vascular spasms. The symptoms are migrainous there are vasogenic headaches. My nurse performed today a Montral cognitive assessment test with  Mrs. Napolitano  who scored 29 out of 30 points. She missed only 1 out of 5 recall words. This is an excellent result. She has noted a reduced depth perception, but also a lack of left peripheral vision. She gets startled when a car overtakes her from the right. No difficulties with reading. No tingling.  Her sleepiness is more pronounced, but she has not had snoring or apnea. Her husband sleeps in the same room, and he snores has insomnia.     REVIEW OF SYSTEMS: Out of a complete 14 system review of symptoms, the patient complains only of the following symptoms, and all other reviewed systems are negative.  Appetite change- weight gain  Cough Vision restriction . Sleepiness in daytime.  Joint pain, back pain, aching muscles  ALLERGIES: No Known Allergies  HOME MEDICATIONS: Outpatient Prescriptions Prior to Visit  Medication Sig Dispense Refill  . acetaminophen (TYLENOL) 500 MG tablet Take 500 mg by mouth every 6 (six) hours as needed.      Marland Kitchen amLODipine (NORVASC) 5 MG tablet Once a day po, watch for puffy feet ! 90 tablet 3  . calcium carbonate (TUMS - DOSED IN MG ELEMENTAL CALCIUM) 500 MG chewable tablet Chew 1 tablet by mouth as needed.      . Calcium Citrate-Vitamin D 500-250 MG-UNIT PACK Take 1 capsule by mouth daily.      . celecoxib (CELEBREX) 200 MG capsule Take 200 mg by mouth daily.      . Cholecalciferol (VITAMIN D) 2000 UNITS CAPS Take 1 capsule by mouth. 3 time a week     . FLUoxetine (PROZAC) 20 MG capsule Take 40 mg by mouth daily.     . Folic Acid-Vit T7-SVX B93 (FOLGARD PO) Take 1 capsule by mouth daily.      Marland Kitchen loratadine (CLARITIN) 10 MG tablet Take 10 mg by mouth as needed.      . Multiple Vitamins-Minerals (CENTRUM PO) Take 1 capsule by mouth daily.      Marland Kitchen omeprazole (PRILOSEC) 20 MG capsule Take 20 mg by mouth daily.      .  propranolol ER (INDERAL LA) 60 MG 24 hr capsule Take one at night po. 30 capsule 5  . traMADol (ULTRAM) 50 MG tablet Take 100 mg by mouth 2 (two) times daily.     . Alum Hydroxide-Mag Carbonate (GAVISCON PO) Take by mouth as needed.      Marland Kitchen oxyCODONE (OXY IR/ROXICODONE) 5 MG immediate release tablet Take 5 mg by mouth at bedtime as needed.      No facility-administered medications prior to visit.    PAST MEDICAL HISTORY: Past Medical History  Diagnosis Date  . Migraines   . Osteoarthritis   . Circumscribed scleroderma   . Hyperlipidemia   . Depression   . IBS (irritable bowel syndrome)   . Diverticulosis   . Anxiety   . Memory loss   . Brain bleed   . 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 Tubal ligation    . Broken ankle Left     FAMILY HISTORY: Family History  Problem Relation Age of Onset  . Liver cancer Paternal Aunt   . Heart disease Father   . Prostate cancer Paternal Grandfather   . Colon cancer Neg Hx     SOCIAL HISTORY: Social History   Social History  . Marital Status: Married    Spouse Name: N/A  . Number of Children: N/A  . Years of Education: N/A   Occupational History  . Retired     Social History Main Topics  . Smoking status: Former Research scientist (life sciences)  . Smokeless tobacco: Never Used     Comment: Quit now 40 years   . Alcohol Use: Yes     Comment: glass of wine daily   . Drug Use: No  . Sexual Activity: Not on file   Other Topics Concern  . Not on file   Social History Narrative   Daily caffeine, 2 glasses daily,   Right handed, Married, 2 sons. Retired. College.      PHYSICAL EXAM  Filed Vitals:   09/02/15 1030  BP: 116/72  Pulse: 78  Resp: 20  Height: 5\' 4"  (1.626 m)  Weight: 147 lb 8 oz (66.906 kg)   Body mass index is 25.31 kg/(m^2).  Generalized: Well developed, in no acute distress , no longer headaches.   Neurological  examination  Mentation: Alert oriented to time, place, history taking. Follows all commands speech and language fluent.  MOCA 29/30 in March -  . Cranial nerve : Pupils were equal round reactive to light.  Eye movements were full, visual field were full on confrontational test. Facial sensation and strength were normal. Uvula tongue midline. Head turning and shoulder shrug  were normal and symmetric. Motor: The motor testing reveals 5 /5 strength of all 4 extremities.  Good symmetric motor tone is noted throughout.  Sensory: Sensory testing is intact to soft touch on all 4 extremities. No evidence of extinction is noted.  Coordination: Cerebellar testing reveals good finger-nose-finger and heel-to-shin bilaterally.  Gait and station: Gait is normal. Tandem gait is unsteady. Romberg is negative. No drift is seen. The patient can turn to her right side was 4 steps into her left was 3-1/2 steps. This is normal gait for age and gender. The step width is normal and there is no wide-based gait noted. She also has preserved normal arm swing as she walks.  Reflexes: Deep tendon reflexes are symmetric and normal bilaterally. Babinski down going.  Marland Kitchen   DIAGNOSTIC DATA (LABS, IMAGING, TESTING) - I reviewed patient records, labs, notes, testing and imaging myself where available : Dr. Deon Pilling, ophthalmologist : no vision deficit noted.    ASSESSMENT AND PLAN  25 minute RV with memory testing and HA follow up, patient reports weight gain and appetite changes, impulsivity since the frontal lobe bleed.   MOCA score remained 29-30/ unchanged.  69 y.o. 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 here with:  1. History of intracranial hemorrhage 2. Migraines resolved, advised to not  take Maxalt any more.   The patient has remained stable since the last visit. No episodes of confusion.   She continues to take propranolol for migraines. She states she has not  had any migraines since the last visit.  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 The cause of her bleed is not identified, unsettling to her.  Her physical exam today is unremarkable. Her MOCA score is 29/30. Her blood pressure is a normal range today.  I have advised the patient that if she has any confusional episodes or weakness in any of the extremities she is to let us know or go to the emergency room.  I also reviewed agian  the signs and symptoms of a stroke, visit 25 minutes . Marland Kitchen The patient will follow up prn with me, Dr. Brett Fairy.   Raiford Fetterman , MD9/21/2016, 11:04 AM [facility] [address], [place], [place] [address] [phone]

## 2015-09-03 ENCOUNTER — Encounter (HOSPITAL_COMMUNITY)
Admission: RE | Admit: 2015-09-03 | Discharge: 2015-09-03 | Disposition: A | Discharge status: Home / Self Care | Payer: Medicare Other | Source: Ambulatory Visit | Attending: Internal Medicine | Admitting: Internal Medicine

## 2015-09-03 DIAGNOSIS — M810 Age-related osteoporosis without current pathological fracture: Secondary | ICD-10-CM | POA: Insufficient documentation

## 2015-09-03 MED ORDER — ZOLEDRONIC ACID 5 MG/100ML IV SOLN
5.0000 mg | Freq: Once | INTRAVENOUS | Status: AC
  Administered 2015-09-03: 5 mg via INTRAVENOUS

## 2015-12-21 ENCOUNTER — Encounter: Payer: Self-pay | Admitting: Internal Medicine

## 2016-10-25 DIAGNOSIS — Z87891 Personal history of nicotine dependence: Secondary | ICD-10-CM | POA: Insufficient documentation

## 2016-10-25 DIAGNOSIS — I7300 Raynaud's syndrome without gangrene: Secondary | ICD-10-CM | POA: Insufficient documentation

## 2016-10-25 DIAGNOSIS — M7062 Trochanteric bursitis, left hip: Secondary | ICD-10-CM

## 2016-10-25 DIAGNOSIS — K219 Gastro-esophageal reflux disease without esophagitis: Secondary | ICD-10-CM | POA: Insufficient documentation

## 2016-10-25 DIAGNOSIS — M7061 Trochanteric bursitis, right hip: Secondary | ICD-10-CM | POA: Insufficient documentation

## 2016-10-25 NOTE — Progress Notes (Deleted)
   Office Visit Note  Patient: Heather Lindsey             Date of Birth: 08/05/1946           MRN: [id number]             PCP: Tivis Ringer, MD Referring: Prince Solian, MD Visit Date: 10/28/2016 Occupation: @GUAROCC @    Subjective:  No chief complaint on file.   History of Present Illness: Heather Lindsey is a 70 y.o. female ***   Activities of Daily Living:  Patient reports morning stiffness for *** {minute/hour:19697}.   Patient {ACTIONS;DENIES/REPORTS:21021675::"Denies"} nocturnal pain.  Difficulty dressing/grooming: {ACTIONS;DENIES/REPORTS:21021675::"Denies"} Difficulty climbing stairs: {ACTIONS;DENIES/REPORTS:21021675::"Denies"} Difficulty getting out of chair: {ACTIONS;DENIES/REPORTS:21021675::"Denies"} Difficulty using hands for taps, buttons, cutlery, and/or writing: {ACTIONS;DENIES/REPORTS:21021675::"Denies"}   No Rheumatology ROS completed.   PMFS History:  Patient Active Problem List   Diagnosis Date Noted  . Raynaud's phenomenon without gangrene 10/25/2016  . Trochanteric bursitis of both hips 10/25/2016  . Gastroesophageal reflux disease  10/25/2016  . Intracranial hemorrhage, spontaneous subarachnoid, idiopathic, chronic ([place]) 09/02/2015  . Bleeding in brain due to blood pressure disorder ([place]) 10/01/2014  . HYPERCHOLESTEROLEMIA 11/18/2008  . Depression 11/18/2008  . IRRITABLE BOWEL SYNDROME 11/18/2008  . Morphea 11/18/2008  . Primary osteoarthritis of both hands 11/18/2008  . Age related osteoporosis 11/18/2008  . MIGRAINES, HX OF 11/18/2008    Past Medical History:  Diagnosis Date  . Anxiety   . 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 Brain bleed   . Circumscribed scleroderma   . Depression   . Diverticulosis   . Hyperlipidemia   . IBS (irritable bowel syndrome)   . Memory loss   . Migraines   . Osteoarthritis     Family History  Problem Relation Age of Onset  . Liver cancer Paternal Aunt   . Heart disease Father   .  Prostate cancer Paternal Grandfather   . Colon cancer Neg Hx    Past Surgical History:  Procedure Laterality Date  . broken ankle Left   . TUBAL LIGATION     Social History   Social History Narrative   Daily caffeine, 2 glasses daily,   Right handed, Married, 2 sons. Retired. College.     Objective: Vital Signs: There were no vitals taken for this visit.   Physical Exam   Musculoskeletal Exam: ***  CDAI Exam: No CDAI exam completed.    Investigation: Findings:  01/20/2016 CMP normal, CBC normal, lipid panel LDL 172 HDL 75, TSH 2.36, vitamin D 53.6, hemoglobin A1c 5.6%, UA negative    Imaging: No results found.  Speciality Comments: No specialty comments available.    Procedures:  No procedures performed Allergies: Patient has no known allergies.   Assessment / Plan: Visit Diagnoses: Morphea  Raynaud's phenomenon - On Norvasc 5 mg daily  Primary osteoarthritis of both hands  Trochanteric bursitis of both hips  Age related osteoporosis - Status post Forteo, on Reclast, treated by PCP  Former smoker,  Intracranial hemorrhage, spontaneous subarachnoid, idiopathic, chronic (HCC)  Depression  Gastroesophageal reflux disease     Orders: No orders of the defined types were placed in this encounter.  No orders of the defined types were placed in this encounter.   Face-to-face time spent with patient was *** minutes. 50% of time was spent in counseling and coordination of care.  Follow-Up Instructions: No Follow-up on file.   Bo Merino, MD

## 2016-10-28 ENCOUNTER — Ambulatory Visit: Payer: Medicare Other | Admitting: Rheumatology

## 2016-11-21 ENCOUNTER — Other Ambulatory Visit: Payer: Self-pay | Admitting: Rheumatology

## 2016-11-21 NOTE — Progress Notes (Signed)
Office Visit Note  Patient: Heather Lindsey             Date of Birth: 02-24-1946           MRN: [id number]             PCP: Tivis Ringer, MD Referring: Prince Solian, MD Visit Date: 11/24/2016 Occupation: @GUAROCC @    Subjective:  Pain of the Right Hand and Pain of the Left Hand   History of Present Illness: Heather Lindsey is a 70 y.o. female  Last seen 04/27/2016. Patient is doing well with her morphea. It is stable. Except she does have a few extra spots versus the last time. She does have Raynaud's but she has not had any flares. She is using Norvasc 5 mg regularly and it helps her a lot.  She does have osteoporosis and she gets re-class for her PCP. Her last week last injection was last summer. She'll coordinate the next injection with her PCP.  She had greater trochanter bursa pain at the last visit and now it is all resolved. Today she has no more bursa pain.  Need any refills of her medications but she does state that: The baclofen really works well for her and she uses it with good relief but she does not need any refills at this time She uses tramadol very sparingly and she has enough tramadol at home at this time.   Activities of Daily Living:  Patient reports morning stiffness for 15 minutes.   Patient Denies nocturnal pain.  Difficulty dressing/grooming: Denies Difficulty climbing stairs: Denies Difficulty getting out of chair: Denies Difficulty using hands for taps, buttons, cutlery, and/or writing: Reports   Review of Systems  Constitutional: Positive for activity change. Negative for fatigue.  HENT: Negative for mouth sores and mouth dryness.   Eyes: Negative for dryness.  Respiratory: Negative for shortness of breath.   Gastrointestinal: Negative for constipation and diarrhea.  Musculoskeletal: Negative for myalgias and myalgias.  Skin: Negative for sensitivity to sunlight.  Psychiatric/Behavioral: Negative for decreased concentration and sleep  disturbance.    PMFS History:  Patient Active Problem List   Diagnosis Date Noted  . Raynaud's phenomenon without gangrene 10/25/2016  . Trochanteric bursitis of both hips 10/25/2016  . Gastroesophageal reflux disease  10/25/2016  . Former smoker, 10/25/2016  . Intracranial hemorrhage, spontaneous subarachnoid, idiopathic, chronic ([place]) 09/02/2015  . 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 HYPERCHOLESTEROLEMIA 11/18/2008  . Depression 11/18/2008  . IRRITABLE BOWEL SYNDROME 11/18/2008  . Morphea 11/18/2008  . Primary osteoarthritis of both hands 11/18/2008  . Age related osteoporosis 11/18/2008  . MIGRAINES, HX OF 11/18/2008    Past Medical History:  Diagnosis Date  . Anxiety   . 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 Brain bleed ([place])   . Circumscribed scleroderma   . Depression   . Diverticulosis   . Hyperlipidemia   . IBS (irritable bowel syndrome)   . Memory loss   . Migraines   . Osteoarthritis     Family History  Problem Relation Age of Onset  . Liver cancer Paternal Aunt   . Heart disease Father   . Prostate cancer Paternal Grandfather   . Colon cancer Neg Hx    Past Surgical History:  Procedure Laterality Date  . broken ankle Left   . TUBAL LIGATION     Social History   Social History Narrative   Daily caffeine, 2 glasses daily,  Right handed, Married, 2 sons. Retired. College.     Objective: Vital Signs: BP 122/68   Pulse 76   Resp 14   Ht 5\' 4"  (1.626 m)   Wt 150 lb (68 kg)   BMI 25.75 kg/m    Physical Exam  Constitutional: She is oriented to person, place, and time. She appears well-developed and well-nourished.  HENT:  Head: Normocephalic and atraumatic.  Eyes: EOM are normal. Pupils are equal, round, and reactive to light.  Cardiovascular: Normal rate, regular rhythm and normal heart sounds.  Exam reveals no gallop and no friction rub.   No murmur heard. Pulmonary/Chest: Effort  normal and breath sounds normal. She has no wheezes. She has no rales.  Abdominal: Soft. Bowel sounds are normal. She exhibits no distension. There is no tenderness. There is no guarding. No hernia.  Musculoskeletal: Normal range of motion. She exhibits no edema, tenderness or deformity.  Lymphadenopathy:    She has no cervical adenopathy.  Neurological: She is alert and oriented to person, place, and time. Coordination normal.  Skin: Skin is warm and dry. Capillary refill takes less than 2 seconds. Rash (ongoing morphea lesions; no scleroderma) noted.  Psychiatric: She has a normal mood and affect. Her behavior is normal.  Nursing note and vitals reviewed.    Musculoskeletal Exam:  Full range of motion of all joints Grip strength is equal and strong bilaterally Fibromyalgia tender points are all absent. Last time she had 2 out of 18 tender points because both of her greater trochanter bursa were painful. That is all resolved at this visit and she has no tender points.  CDAI Exam: CDAI Homunculus Exam:   Joint Counts:  CDAI Tender Joint count: 0 CDAI Swollen Joint count: 0  Global Assessments:  Patient Global Assessment: 0 Provider Global Assessment: 0  No synovitis on examination except patient does have some CMC pain bilaterally   BUT NO greater trochanter bursa discomfort (alll resolved)  Investigation: No additional findings.   Imaging: No results found.  Speciality Comments: No specialty comments available.    Procedures:  No procedures performed Allergies: Patient has no known allergies.   Assessment / Plan:     Visit Diagnoses: Morphea - 11/24/2016 ===>  stable except increase morphea lesions;  Raynaud's phenomenon without gangrene - doing well w/ norvasc 5mg  --> no flare;  Age-related osteoporosis without current pathological fracture - addressed by PCP;  Trochanteric bursitis of both hips  Patient is not having any bursitis at the greater trochanter  anymore.  She is doing really well with her morphea. It is stable  Norvasc is also doing very well. She is using 5 mg every day and it helps her and she hasn't had any flares of her Raynaud's.  She gets re-class every summer through her PCP. She is doing well with that.  She does not need any refills on any medications  Orders: No orders of the defined types were placed in this encounter.  No orders of the defined types were placed in this encounter.   Face-to-face time spent with patient was 30 minutes. 50% of time was spent in counseling and coordination of care.  Follow-Up Instructions: Return in about 6 months (around 05/25/2017) for Raynauds(stable),Morphea(stable),OP (pcp),.   Eliezer Lofts, PA-C   I examined and evaluated the patient with Eliezer Lofts PA. The plan of care was discussed as noted above.  Bo Merino, MD

## 2016-11-22 ENCOUNTER — Other Ambulatory Visit: Payer: Self-pay | Admitting: Rheumatology

## 2016-11-22 NOTE — Telephone Encounter (Signed)
Last Visit: 04/27/16 Next Visit: 11/24/16  Labs: 04/27/16 WNL  Okay to refill Celebrex?

## 2016-11-22 NOTE — Telephone Encounter (Signed)
Last Visit: 04/27/16 Next Visit: 11/24/16  Labs: 04/27/16 WNL  Okay to refill Baclofen?

## 2016-11-24 ENCOUNTER — Ambulatory Visit (INDEPENDENT_AMBULATORY_CARE_PROVIDER_SITE_OTHER): Payer: Medicare Other | Admitting: Rheumatology

## 2016-11-24 ENCOUNTER — Encounter: Payer: Self-pay | Admitting: Rheumatology

## 2016-11-24 VITALS — BP 122/68 | HR 76 | Resp 14 | Ht 64.0 in | Wt 150.0 lb

## 2016-11-24 DIAGNOSIS — L940 Localized scleroderma [morphea]: Secondary | ICD-10-CM | POA: Diagnosis not present

## 2016-11-24 DIAGNOSIS — I7300 Raynaud's syndrome without gangrene: Secondary | ICD-10-CM | POA: Diagnosis not present

## 2016-11-24 DIAGNOSIS — M810 Age-related osteoporosis without current pathological fracture: Secondary | ICD-10-CM | POA: Diagnosis not present

## 2016-11-24 DIAGNOSIS — M7062 Trochanteric bursitis, left hip: Secondary | ICD-10-CM

## 2016-11-24 DIAGNOSIS — M7061 Trochanteric bursitis, right hip: Secondary | ICD-10-CM | POA: Diagnosis not present

## 2016-11-24 NOTE — Patient Instructions (Signed)
Hand Exercises Introduction Hand exercises can be helpful to almost anyone. These exercises can strengthen the hands, improve flexibility and movement, and increase blood flow to the hands. These results can make work and daily tasks easier. Hand exercises can be especially helpful for people who have joint pain from arthritis or have nerve damage from overuse (carpal tunnel syndrome). These exercises can also help people who have injured a hand. Most of these hand exercises are fairly gentle stretching routines. You can do them often throughout the day. Still, it is a good idea to ask your health care provider which exercises would be best for you. Warming your hands before exercise may help to reduce stiffness. You can do this with gentle massage or by placing your hands in warm water for 15 minutes. Also, make sure you pay attention to your level of hand pain as you begin an exercise routine. Exercises Knuckle Bend  Repeat this exercise 5-10 times with each hand. 1. Stand or sit with your arm, hand, and all five fingers pointed straight up. Make sure your wrist is straight. 2. Gently and slowly bend your fingers down and inward until the tips of your fingers are touching the tops of your palm. 3. Hold this position for a few seconds. 4. Extend your fingers out to their original position, all pointing straight up again. Finger Fan  Repeat this exercise 5-10 times with each hand. 1. Hold your arm and hand out in front of you. Keep your wrist straight. 2. Squeeze your hand into a fist. 3. Hold this position for a few seconds. 4. Fan out, or spread apart, your hand and fingers as much as possible, stretching every joint fully. Tabletop  Repeat this exercise 5-10 times with each hand. 1. Stand or sit with your arm, hand, and all five fingers pointed straight up. Make sure your wrist is straight. 2. Gently and slowly bend your fingers at the knuckles where they meet the hand until your hand is  making an upside-down L shape. Your fingers should form a tabletop. 3. Hold this position for a few seconds. 4. Extend your fingers out to their original position, all pointing straight up again. Making Os  Repeat this exercise 5-10 times with each hand. 1. Stand or sit with your arm, hand, and all five fingers pointed straight up. Make sure your wrist is straight. 2. Make an O shape by touching your pointer finger to your thumb. Hold for a few seconds. Then open your hand wide. 3. Repeat this motion with each finger on your hand. Table Spread  Repeat this exercise 5-10 times with each hand. 1. Place your hand on a table with your palm facing down. Make sure your wrist is straight. 2. Spread your fingers out as much as possible. Hold this position for a few seconds. 3. Slide your fingers back together again. Hold for a few seconds. Ball Grip  Repeat this exercise 10-15 times with each hand. 1. Hold a tennis ball or another soft ball in your hand. 2. While slowly increasing pressure, squeeze the ball as hard as possible. 3. Squeeze as hard as you can for 3-5 seconds. 4. Relax and repeat. Wrist Curls  Repeat this exercise 10-15 times with each hand. 1. Sit in a chair that has armrests. 2. Hold a light weight in your hand, such as a dumbbell that weighs 1-3 pounds (0.5-1.4 kg). Ask your health care provider what weight would be best for you. 3. Rest your hand just   over the end of the chair arm with your palm facing up. 4. Gently pivot your wrist up and down while holding the weight. Do not twist your wrist from side to side. Contact a health care provider if:  Your hand pain or discomfort gets much worse when you do an exercise.  Your hand pain or discomfort does not improve within 2 hours after you exercise. If you have any of these problems, stop doing these exercises right away. Do not do them again unless your health care provider says that you can. Get help right away if:  You  develop sudden, severe hand pain. If this happens, stop doing these exercises right away. Do not do them again unless your health care provider says that you can. This information is not intended to replace advice given to you by your health care provider. Make sure you discuss any questions you have with your health care provider. Document Released: 11/09/2015 Document Revised: 05/05/2016 Document Reviewed: 06/08/2015  2017 Elsevier  

## 2017-02-13 ENCOUNTER — Encounter: Payer: Self-pay | Admitting: Physician Assistant

## 2017-02-28 ENCOUNTER — Encounter: Payer: Self-pay | Admitting: Physician Assistant

## 2017-02-28 ENCOUNTER — Ambulatory Visit (INDEPENDENT_AMBULATORY_CARE_PROVIDER_SITE_OTHER): Payer: Medicare Other | Admitting: Physician Assistant

## 2017-02-28 VITALS — BP 110/80 | HR 80 | Ht 64.0 in | Wt 149.6 lb

## 2017-02-28 DIAGNOSIS — K219 Gastro-esophageal reflux disease without esophagitis: Secondary | ICD-10-CM

## 2017-02-28 DIAGNOSIS — R194 Change in bowel habit: Secondary | ICD-10-CM | POA: Diagnosis not present

## 2017-02-28 DIAGNOSIS — R1030 Lower abdominal pain, unspecified: Secondary | ICD-10-CM

## 2017-02-28 DIAGNOSIS — R05 Cough: Secondary | ICD-10-CM

## 2017-02-28 DIAGNOSIS — R053 Chronic cough: Secondary | ICD-10-CM

## 2017-02-28 MED ORDER — NA SULFATE-K SULFATE-MG SULF 17.5-3.13-1.6 GM/177ML PO SOLN: 1.0000 | Freq: Once | ORAL | 0 refills | Status: AC

## 2017-02-28 MED ORDER — PANTOPRAZOLE SODIUM 40 MG PO TBEC: 40.0000 mg | DELAYED_RELEASE_TABLET | Freq: Two times a day (BID) | ORAL | 2 refills | Status: DC

## 2017-02-28 NOTE — Patient Instructions (Signed)
We have sent the following medications to your pharmacy for you to pick up at your convenience: Protonix.    You have been scheduled for an endoscopy and colonoscopy. Please follow the written instructions given to you at your visit today. Please pick up your prep supplies at the pharmacy within the next 1-3 days. If you use inhalers (even only as needed), please bring them with you on the day of your procedure. Your physician has requested that you go to www.startemmi.com and enter the access code given to you at your visit today. This web site gives a general overview about your procedure. However, you should still follow specific instructions given to you by our office regarding your preparation for the procedure.  

## 2017-02-28 NOTE — Progress Notes (Signed)
Thank you for sending this case to me. I have reviewed the entire note, and the outlined plan seems appropriate.   Henry Danis, MD  

## 2017-02-28 NOTE — Progress Notes (Signed)
Subjective:    Patient ID: Heather Lindsey, female    DOB: [birth year], 71 y.o.   MRN: [id number]  HPI  Heather Lindsey is a pleasant 71 year old white female, new to GI today referred by Dr. Dagmar Hait abdominal pain and GERD. She is known remotely to Dr. Delfin Edis from colonoscopy done in 2009 with finding of moderate sigmoid diverticulosis and otherwise negative exam. Patient does have a history of a subarachnoid bleed in 2015, depression and Raynauds. Patient says her primary issue with reflux is a chronic cough which is thought to be reflux induced. She says she has had symptoms for multiple years and has been on omeprazole 20 mg by mouth daily long-term. She states it was suggested she's changed to nighttime dosing but has not done that. She says she has a cough on a daily basis usually more prevalent in the mornings and nonproductive. She denies any nocturnal symptoms. She says she does sometimes wake up at night with heartburn but generally not with coughing. She also has intermittent heartburn symptoms during the day usually diet dependent. She denies any dysphagia or odynophagia. She does take Clinoril fairly regularly. Her other issue has been some mid abdominal pain which she says migrates and sometimes is present in the left abdomen and sometimes the right. Isn't is not present daily seems to be worse with any gassy foods especially broccoli. She feels that these symptoms have been increased over the past 6 months or so. Her appetite is been fine her weight has been stable. Her bowel habits will alternate between constipation and looser stools she has not noted any melena or hematochezia. She has not had recent GYN evaluation.  Review of Systems Pertinent positive and negative review of systems were noted in the above HPI section.  All other review of systems was otherwise negative.  Outpatient Encounter Prescriptions as of 02/28/2017  Medication Sig  . acetaminophen (TYLENOL) 500 MG tablet Take 500 mg by  mouth every 6 (six) hours as needed.    . ALPRAZolam (XANAX) 0.5 MG tablet   . amLODipine (NORVASC) 5 MG tablet Once a day po, watch for puffy feet !  . baclofen (LIORESAL) 10 MG tablet TAKE 1 TABLET BY MOUTH AT  8AM AS NEEDED AND TAKE 1  TABLET AT 2PM AS NEEDED  . calcium carbonate (TUMS - DOSED IN MG ELEMENTAL CALCIUM) 500 MG chewable tablet Chew 1 tablet by mouth as needed.    . Calcium Citrate-Vitamin D 500-250 MG-UNIT PACK Take 1 capsule by mouth daily.    . celecoxib (CELEBREX) 200 MG capsule TAKE 1 CAPSULE BY MOUTH  TWICE A DAY  . Cholecalciferol (VITAMIN D) 2000 UNITS CAPS Take 1 capsule by mouth. 3 time a week   . fluticasone (FLONASE) 50 MCG/ACT nasal spray Place 1 spray into both nostrils daily as needed for allergies or rhinitis.  . Folic Acid-Vit X5-AVW P79 (FOLGARD PO) Take 1 capsule by mouth daily.    . hyoscyamine (LEVSIN) 0.125 MG tablet Take 0.125 mg by mouth every 4 (four) hours as needed.  . loratadine (CLARITIN) 10 MG tablet Take 10 mg by mouth as needed.    . Multiple Vitamins-Minerals (CENTRUM PO) Take 1 capsule by mouth daily.    Marland Kitchen omeprazole (PRILOSEC) 20 MG capsule Take 20 mg by mouth daily.    . propranolol ER (INDERAL LA) 60 MG 24 hr capsule Take one at night po.  . traMADol (ULTRAM) 50 MG tablet Take 100 mg by mouth 2 (two) times daily.   Marland Kitchen  Zoledronic Acid (RECLAST IV) Inject into the vein.   No facility-administered encounter medications on file as of 02/28/2017.    No Known Allergies Patient Active Problem List   Diagnosis Date Noted  . Raynaud's phenomenon without gangrene 10/25/2016  . Trochanteric bursitis of both hips 10/25/2016  . Gastroesophageal reflux disease  10/25/2016  . Former smoker, 10/25/2016  . 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 HYPERCHOLESTEROLEMIA 11/18/2008  . Depression 11/18/2008  . IRRITABLE BOWEL SYNDROME  11/18/2008  . Morphea 11/18/2008  . Primary osteoarthritis of both hands 11/18/2008  . Age related osteoporosis 11/18/2008  . MIGRAINES, HX OF 11/18/2008   Social History   Social History  . Marital status: Married    Spouse name: N/A  . Number of children: N/A  . Years of education: N/A   Occupational History  . Retired     Social History Main Topics  . Smoking status: Former Research scientist (life sciences)  . Smokeless tobacco: Never Used     Comment: Quit now 40 years   . Alcohol use Yes     Comment: glass of wine daily   . Drug use: No  . Sexual activity: Not on file   Other Topics Concern  . Not on file   Social History Narrative   Daily caffeine, 2 glasses daily,   Right handed, Married, 2 sons. Retired. College.    Ms. Arduini's family history includes Heart disease in her father; Liver cancer in her paternal aunt; Prostate cancer in her paternal grandfather.      Objective:    Vitals:   02/28/17 1000  BP: 110/80  Pulse: 80    Physical Exam  well-developed older white female in no acute distress, pleasant blood pressure 110/80 pulse 80, height 5 foot 4, weight 149, BMI 25.6. HEENT; nontraumatic normocephalic EOMI PERRLA sclera anicteric, Cardiovascular; regular rate and rhythm with S1-S2 no murmur or gallop, Pulmonary; clear bilaterally, Abdomen ;soft, there is no focal tenderness no guarding or rebound no palpable mass or hepatosplenomegaly bowel sounds are present, Rectal; exam not done, Ext; no clubbing cyanosis or edema skin warm and dry, Neuropsych; mood and affect appropriate       Assessment & Plan:   #16 71 year old female with history of GERD and chronic cough. Her cough may be reflux related though she also has allergy issues. Symptoms are refractory to once daily dosing of PPI. #2 abdominal pain, intermittent aggravated by gassy foods, left and right abdomen. Etiology is not clear, rule out IBS, rule out symptomatic diverticular disease, rule out occult colon lesion rule  out GYN pathology #3 history of diverticulosis #4 Raynauds  Plan; Change PPI to Protonix 40 mg by mouth twice a day for a three-month trial to see if this controls her cough. Also Started Strict Antireflux Regimen, Which Was Reviewed Today, and She Was Provided with Diet and Educational Materials.  Low Gas Diet Patient Will Be Scheduled for EGD and Colonoscopy with Dr. Loletha Carrow. Both Procedures Discussed in Detail with Patient Including Risks Benefits and she Is agreeable to proceed. She Has a prescription for Levsin and will use this on an As-Needed basis for abdominal discomfort.  Leiliana Foody S Mattilyn Crites PA-C 02/28/2017   Cc: Prince Solian, MD

## 2017-03-20 ENCOUNTER — Other Ambulatory Visit: Payer: Self-pay

## 2017-03-20 ENCOUNTER — Telehealth: Payer: Self-pay | Admitting: Gastroenterology

## 2017-03-20 MED ORDER — NA SULFATE-K SULFATE-MG SULF 17.5-3.13-1.6 GM/177ML PO SOLN: 1.0000 | Freq: Once | ORAL | 0 refills | Status: AC

## 2017-03-20 NOTE — Telephone Encounter (Signed)
Done as directed by the patient.

## 2017-04-06 ENCOUNTER — Encounter: Payer: Self-pay | Admitting: Gastroenterology

## 2017-04-20 ENCOUNTER — Telehealth: Payer: Self-pay | Admitting: Gastroenterology

## 2017-04-20 ENCOUNTER — Ambulatory Visit (AMBULATORY_SURGERY_CENTER): Payer: Medicare Other | Admitting: Gastroenterology

## 2017-04-20 ENCOUNTER — Encounter: Payer: Self-pay | Admitting: Gastroenterology

## 2017-04-20 VITALS — BP 123/65 | HR 75 | Temp 97.8°F | Resp 12 | Ht 64.0 in | Wt 149.0 lb

## 2017-04-20 DIAGNOSIS — R1032 Left lower quadrant pain: Secondary | ICD-10-CM

## 2017-04-20 DIAGNOSIS — K219 Gastro-esophageal reflux disease without esophagitis: Secondary | ICD-10-CM

## 2017-04-20 DIAGNOSIS — D120 Benign neoplasm of cecum: Secondary | ICD-10-CM | POA: Diagnosis not present

## 2017-04-20 MED ORDER — SODIUM CHLORIDE 0.9 % IV SOLN: 500.0000 mL | INTRAVENOUS | Status: DC

## 2017-04-20 NOTE — Patient Instructions (Signed)
Impression/Recommendations:  Esophagitis handout given to patient. GERD handout given to patient. Polyp handout given to patient. Diverticulosis handout given to patient.  Discontinue morning dose of Pantoprazole.   Continue with suppertime dose. Return to see Dr. Loletha Carrow in one month. Follow antireflux regimen. Repeat colonoscopy for surveillance.  Date to be determined after pathology results reviewed.  Refer to colo-rectal surgeon at the next available appointment. Resume previous diet.  YOU HAD AN ENDOSCOPIC PROCEDURE TODAY AT [facility]:   Refer to the procedure report that was given to you for any specific questions about what was found during the examination.  If the procedure report does not answer your questions, please call your gastroenterologist to clarify.  If you requested that your care partner not be given the details of your procedure findings, then the procedure report has been included in a sealed envelope for you to review at your convenience later.  YOU SHOULD EXPECT: Some feelings of bloating in the abdomen. Passage of more gas than usual.  Walking can help get rid of the air that was put into your GI tract during the procedure and reduce the bloating. If you had a lower endoscopy (such as a colonoscopy or flexible sigmoidoscopy) you may notice spotting of blood in your stool or on the toilet paper. If you underwent a bowel prep for your procedure, you may not have a normal bowel movement for a few days.  Please Note:  You might notice some irritation and congestion in your nose or some drainage.  This is from the oxygen used during your procedure.  There is no need for concern and it should clear up in a day or so.  SYMPTOMS TO REPORT IMMEDIATELY:   Following lower endoscopy (colonoscopy or flexible sigmoidoscopy):  Excessive amounts of blood in the stool  Significant tenderness or worsening of abdominal pains  Swelling of the abdomen that is new,  acute  Fever of 100F or higher   Following upper endoscopy (EGD)  Vomiting of blood or coffee ground material  New chest pain or pain under the shoulder blades  Painful or persistently difficult swallowing  New shortness of breath  Fever of 100F or higher  Black, tarry-looking stools  For urgent or emergent issues, a gastroenterologist can be reached at any hour by calling [phone].   DIET:  We do recommend a small meal at first, but then you may proceed to your regular diet.  Drink plenty of fluids but you should avoid alcoholic beverages for 24 hours.  ACTIVITY:  You should plan to take it easy for the rest of today and you should NOT DRIVE or use heavy machinery until tomorrow (because of the sedation medicines used during the test).    FOLLOW UP: Our staff will call the number listed on your records the next business day following your procedure to check on you and address any questions or concerns that you may have regarding the information given to you following your procedure. If we do not reach you, we will leave a message.  However, if you are feeling well and you are not experiencing any problems, there is no need to return our call.  We will assume that you have returned to your regular daily activities without incident.  If any biopsies were taken you will be contacted by phone or by letter within the next 1-3 weeks.  Please call us at [phone] if you have not heard about the biopsies in 3 weeks.  SIGNATURES/CONFIDENTIALITY: You and/or your care partner have signed paperwork which will be entered into your electronic medical record.  These signatures attest to the fact that that the information above on your After Visit Summary has been reviewed and is understood.  Full responsibility of the confidentiality of this discharge information lies with you and/or your care-partner.

## 2017-04-20 NOTE — Telephone Encounter (Signed)
Faxed referral information to [facility] to schedule consult with Dr. Joyice Faster for anal lesion.

## 2017-04-20 NOTE — Op Note (Signed)
[place] Patient Name: Heather Lindsey Procedure Date: 04/20/2017 7:53 AM MRN: [id number] Endoscopist: Mallie Mussel L. Loletha Carrow , MD Age: 70 Referring MD:  Date of Birth: 11/03/1946 Gender: Female Account #: [id number] Procedure:                Upper GI endoscopy Indications:              Chronic cough Medicines:                Monitored Anesthesia Care Procedure:                Pre-Anesthesia Assessment:                           - Prior to the procedure, a History and Physical                            was performed, and patient medications and                            allergies were reviewed. The patient's tolerance of                            previous anesthesia was also reviewed. The risks                            and benefits of the procedure and the sedation                            options and risks were discussed with the patient.                            All questions were answered, and informed consent                            was obtained. Prior Anticoagulants: The patient has                            taken no previous anticoagulant or antiplatelet                            agents. ASA Grade Assessment: III - A patient with                            severe systemic disease. After reviewing the risks                            and benefits, the patient was deemed in                            satisfactory condition to undergo the procedure.                           After obtaining informed consent, the endoscope was  passed under direct vision. Throughout the                            procedure, the patient's blood pressure, pulse, and                            oxygen saturations were monitored continuously. The                            Endoscope was introduced through the mouth, and                            advanced to the second part of duodenum. The upper                            GI endoscopy was accomplished without  difficulty.                            The patient tolerated the procedure well. Scope In: Scope Out: Findings:                 LA Grade A (one or more mucosal breaks less than 5                            mm, not extending between tops of 2 mucosal folds)                            esophagitis was found at the gastroesophageal                            junction.                           A patulous lower esophageal sphincter was found.                           Multiple semi-sessile fundic gland polyps were                            found in the gastric body.                           The cardia and gastric fundus were normal on                            retroflexion.                           The examined duodenum was normal. Complications:            No immediate complications. Estimated Blood Loss:     Estimated blood loss: none. Impression:               - LA Grade A reflux esophagitis.                           -  Patulous lower esophageal sphincter.                           - Multiple fundic gland polyps. These are benign                            and from chronic PPI use.                           - Normal examined duodenum.                           - No specimens collected. Recommendation:           - Patient has a contact number available for                            emergencies. The signs and symptoms of potential                            delayed complications were discussed with the                            patient. Return to normal activities tomorrow.                            Written discharge instructions were provided to the                            patient.                           - Resume previous diet.                           - Continue present medications, but discontinue                            morning dose of pantoprazole (continue suppertime                            dose)                           - See the other procedure note for  documentation of                            additional recommendations.                           - Return to my office in 1 month.                           - Follow an antireflux regimen indefinitely. Heather Lindsey L. Loletha Carrow, MD 04/20/2017 8:40:13 AM This report has been signed electronically.

## 2017-04-20 NOTE — Telephone Encounter (Signed)
Please send a referral to Dr Leighton Ruff at [facility] to evaluate anal lesion found during colonoscopy. Please send my colonoscopy report from today.  Patient has a color copy of the report with photos to bring with her when she sees the surgeon.  Routine referral.

## 2017-04-20 NOTE — Progress Notes (Signed)
Report given to PACU, vss 

## 2017-04-20 NOTE — Progress Notes (Signed)
Pt's states no medical or surgical changes since previsit or office visit. 

## 2017-04-20 NOTE — Op Note (Signed)
[place] Patient Name: Heather Lindsey Procedure Date: 04/20/2017 7:53 AM MRN: [id number] Endoscopist: Mallie Mussel L. Loletha Carrow , MD Age: 71 Referring MD:  Date of Birth: [birth year] Gender: Female Account #: [id number] Procedure:                Colonoscopy Indications:              Abdominal pain in the left lower quadrant Medicines:                Monitored Anesthesia Care Procedure:                Pre-Anesthesia Assessment:                           - Prior to the procedure, a History and Physical                            was performed, and patient medications and                            allergies were reviewed. The patient's tolerance of                            previous anesthesia was also reviewed. The risks                            and benefits of the procedure and the sedation                            options and risks were discussed with the patient.                            All questions were answered, and informed consent                            was obtained. Prior Anticoagulants: The patient has                            taken no previous anticoagulant or antiplatelet                            agents. ASA Grade Assessment: III - A patient with                            severe systemic disease. After reviewing the risks                            and benefits, the patient was deemed in                            satisfactory condition to undergo the procedure.                           After obtaining informed consent, the colonoscope  was passed under direct vision. Throughout the                            procedure, the patient's blood pressure, pulse, and                            oxygen saturations were monitored continuously. The                            Colonoscope was introduced through the anus and                            advanced to the the cecum, identified by                            appendiceal orifice and  ileocecal valve. The                            colonoscopy was performed without difficulty. The                            patient tolerated the procedure well. The quality                            of the bowel preparation was good. The ileocecal                            valve, appendiceal orifice, and rectum were                            photographed. The quality of the bowel preparation                            was evaluated using the BBPS [facility] Bowel                            Preparation Scale) with scores of: Right Colon = 2,                            Transverse Colon = 2 and Left Colon = 2. The total                            BBPS score equals 6. After lavage. The bowel                            preparation used was SUPREP. Scope In: 8:15:06 AM Scope Out: 8:32:32 AM Scope Withdrawal Time: 0 hours 13 minutes 19 seconds  Total Procedure Duration: 0 hours 17 minutes 26 seconds  Findings:                 The perianal and digital rectal examinations were                            normal.  A 2 mm polyp was found in the cecum. The polyp was                            sessile. The polyp was removed with a piecemeal                            technique using a cold biopsy forceps. Resection                            and retrieval were complete.                           Multiple diverticula were found in the left colon.                           A polypoid lesion was found at the anal verge. The                            lesion was sessile and in the right posterior                            position. It was not biopsied, as it appeard to be                            involving the anal verge.                           The exam was otherwise without abnormality on                            direct and retroflexion views. Complications:            No immediate complications. Estimated Blood Loss:     Estimated blood loss: none. Impression:                - One 2 mm polyp in the cecum, removed piecemeal                            using a cold biopsy forceps. Resected and retrieved.                           - Diverticulosis in the left colon.                           - Likely benign polypoid lesion at the anus.                            Possibly atypical appearance of internal hemorrhoid                           - The examination was otherwise normal on direct                            and retroflexion views.  Abdominal pain is most consistent with IBS. Recommendation:           - Patient has a contact number available for                            emergencies. The signs and symptoms of potential                            delayed complications were discussed with the                            patient. Return to normal activities tomorrow.                            Written discharge instructions were provided to the                            patient.                           - Resume previous diet.                           - Continue present medications.                           - Await pathology results.                           - Repeat colonoscopy is recommended for                            surveillance. The colonoscopy date will be                            determined after pathology results from today's                            exam become available for review.                           - Refer to a colo-rectal surgeon at the next                            available appointment. Ophia Shamoon L. Loletha Carrow, MD 04/20/2017 8:46:01 AM This report has been signed electronically.

## 2017-04-20 NOTE — Progress Notes (Signed)
Called to room to assist during endoscopic procedure.  Patient ID and intended procedure confirmed with present staff. Received instructions for my participation in the procedure from the performing physician.  

## 2017-04-21 ENCOUNTER — Telehealth: Payer: Self-pay | Admitting: *Deleted

## 2017-04-21 NOTE — Telephone Encounter (Signed)
No answer. Name identifier. Message left to call if questions or concerns. 

## 2017-04-21 NOTE — Telephone Encounter (Signed)
No answer, left message to call if questions or concerns. 

## 2017-04-26 ENCOUNTER — Encounter: Payer: Self-pay | Admitting: Gastroenterology

## 2017-04-27 ENCOUNTER — Telehealth: Payer: Self-pay

## 2017-04-27 NOTE — Telephone Encounter (Signed)
Patient has appointment with Dr. Marcello Moores at [place] on 6/19 at 10:00.

## 2017-05-04 ENCOUNTER — Other Ambulatory Visit: Payer: Self-pay | Admitting: Rheumatology

## 2017-05-04 NOTE — Telephone Encounter (Signed)
Last Visit: 11/24/16 Next Visit: 05/25/17  Okay to refill Baclofen?

## 2017-05-24 ENCOUNTER — Ambulatory Visit: Payer: Medicare Other | Admitting: Gastroenterology

## 2017-05-25 ENCOUNTER — Encounter: Payer: Self-pay | Admitting: Rheumatology

## 2017-05-25 ENCOUNTER — Ambulatory Visit (INDEPENDENT_AMBULATORY_CARE_PROVIDER_SITE_OTHER): Payer: Medicare Other | Admitting: Rheumatology

## 2017-05-25 VITALS — BP 108/69 | HR 76 | Resp 14 | Ht 64.0 in | Wt 147.0 lb

## 2017-05-25 DIAGNOSIS — M810 Age-related osteoporosis without current pathological fracture: Secondary | ICD-10-CM | POA: Diagnosis not present

## 2017-05-25 DIAGNOSIS — M7061 Trochanteric bursitis, right hip: Secondary | ICD-10-CM

## 2017-05-25 DIAGNOSIS — L940 Localized scleroderma [morphea]: Secondary | ICD-10-CM | POA: Diagnosis not present

## 2017-05-25 DIAGNOSIS — M7062 Trochanteric bursitis, left hip: Secondary | ICD-10-CM

## 2017-05-25 DIAGNOSIS — I7300 Raynaud's syndrome without gangrene: Secondary | ICD-10-CM

## 2017-05-25 NOTE — Patient Instructions (Signed)
Ankle Exercises Ask your health care provider which exercises are safe for you. Do exercises exactly as told by your health care provider and adjust them as directed. It is normal to feel mild stretching, pulling, tightness, or discomfort as you do these exercises, but you should stop right away if you feel sudden pain or your pain gets worse. Do not begin these exercises until told by your health care provider. Stretching and range of motion exercises These exercises warm up your muscles and joints and improve the movement and flexibility of your ankle. These exercises also help to relieve pain, numbness, and tingling. Exercise A: Dorsiflexion/Plantar Flexion  1. Sit with your __________ knee straight or bent. Do not rest your foot on anything. 2. Flex your __________ ankle to tilt the top of your foot toward your shin. 3. Hold this position for __________ seconds. 4. Point your toes downward to tilt the top of your foot away from your shin. 5. Hold this position for __________ seconds. Repeat __________ times. Complete this exercise __________ times a day. Exercise B: Ankle Alphabet  1. Sit with your __________ foot supported at your lower leg. ? Do not rest your foot on anything. ? Make sure your foot has room to move freely. 2. Think of your __________ foot as a paintbrush, and move your foot to trace each letter of the alphabet in the air. Keep your hip and knee still while you trace. Make the letters as large as you can without increasing any discomfort. 3. Trace every letter from A to Z. Repeat __________ times. Complete this exercise __________ times a day. Exercise C: Ankle Dorsiflexion, Passive 1. Sit on a chair that is placed on a non-carpeted surface. 2. Place your __________ foot on the floor, directly under your __________ knee. Extend your __________ leg for support. 3. Keeping your heel down, slide your __________ foot back toward the chair until you feel a stretch at your  ankle or calf. If you do not feel a stretch, slide your buttocks forward to the edge of the chair. 4. Hold this stretch for __________ seconds. Repeat __________ times. Complete this stretch __________ times a day. Strengthening exercises These exercises build strength and endurance in your ankle. Endurance is the ability to use your muscles for a long time, even after they get tired. Exercise D: Dorsiflexors  1. Secure a rubber exercise band or tube to an object, such as a table leg, that will stay still when the band is pulled. Secure the other end around your __________ foot. 2. Sit on the floor, facing the object with your __________ leg extended. The band or tube should be slightly tense when your foot is relaxed. 3. Slowly flex your __________ ankle and toes to bring your foot toward you. 4. Hold this position for __________ seconds. 5. Slowly return your foot to the starting position, controlling the band as you do that. Repeat __________ times. Complete this exercise __________ times a day. Exercise E: Plantar Flexors  1. Sit on the floor with your __________ leg extended. 2. Loop a rubber exercise band or tube around the ball of your __________ foot. The ball of your foot is on the walking surface, right under your toes. The band or tube should be slightly tense when your foot is relaxed. 3. Slowly point your toes downward, pushing them away from you. 4. Hold this position for __________ seconds. 5. Slowly release the tension in the band or tube, controlling smoothly until your foot is  back in the starting position. Repeat __________ times. Complete this exercise __________ times a day. Exercise F: Towel Curls  1. Sit in a chair on a non-carpeted surface, and put your feet on the floor. 2. Place a towel in front of your feet. If told by your health care provider, add __________ to the end of the towel. 3. Keeping your heel on the floor, put your __________ foot on the  towel. 4. Pull the towel toward you by grabbing the towel with your toes and curling them under. Keep your heel on the floor. 5. Let your toes relax. 6. Grab the towel again. Keep going until the towel is completely underneath your foot. Repeat __________ times. Complete this exercise __________ times a day. Exercise G: Heel Raise ( Plantar Flexors, Standing) 1. Stand with your feet shoulder-width apart. 2. Keep your weight spread evenly over the width of your feet while you rise up on your toes. Use a wall or table to steady yourself, but try not to use it for support. 3. If this exercise is too easy, try these options: ? Shift your weight toward your __________ leg until you feel challenged. ? If told by your health care provider, lift your uninjured leg off the floor. 4. Hold this position for __________ seconds. Repeat __________ times. Complete this exercise __________ times a day. Exercise H: Tandem Walking 1. Stand with one foot directly in front of the other. 2. Slowly raise your back foot up, lifting your heel before your toes, and place it directly in front of your other foot. 3. Continue to walk in this heel-to-toe way for __________ or for as long as told by your health care provider. Have a countertop or wall nearby to use if needed to keep your balance, but try not to hold onto anything for support. Repeat __________ times. Complete this exercises __________ times a day. This information is not intended to replace advice given to you by your health care provider. Make sure you discuss any questions you have with your health care provider. Document Released: 10/12/2005 Document Revised: 07/28/2016 Document Reviewed: 08/16/2015 Elsevier Interactive Patient Education  2018 Elsevier Inc.  

## 2017-05-25 NOTE — Progress Notes (Signed)
Office Visit Note  Patient: Heather Lindsey             Date of Birth: 04/30/1946           MRN: [id number]             PCP: Prince Solian, MD Referring: Prince Solian, MD Visit Date: 05/25/2017 Occupation: @GUAROCC @    Subjective:  No chief complaint on file.   History of Present Illness: Heather Lindsey is a 71 y.o. female  Last seen 11/24/2016 for morphea, raynauds, osteoporosis   For patient's osteoporosis, she is getting treatment with Reclast coordinated by her PCP  Although she has Raynaud's, she is not flaring at this time. She does use Norvasc 5 mg regularly and is getting good relief with this medication.  Hx of trochanteric bursitis to both hips  But not having any bursitis to that area anymore.   Her morphea has been stable in the past. At the last visit, she did have a few extra spots.  Today, patient states that her morphea is stable but she continues to get a few extra spots. Lesions do itch at times; uses triamcinalone prn; uses Eucerin, Aquaphor, Lubriderm as moisturizers when needed.  Note: Patient has been on methotrexate in the past prescribed by dermatologist at week for suture versus a day. She was on 0.3 ML's per week.; see 09-04-2013 office note in [facility] It gave inadequate response therefore the medication was discontinued. Pt recalls that her morphea's texture improved but new lesions continued to come. Then later, she was offered CellCept  Patient declined on 09/08/2015 visit; see [facility]   Complaining of right lateral ankle pain for the last 6 months; comes and goes. Patient is able to still bear weight on her right ankle even when it hurts. She describes her right ankle pain as 4-5 on a scale of 0-10. On a typical month, she has pain in her right lateral ankle for about 15 out of 30 days. Usually brought on by activity.   Activities of Daily Living:  Patient reports morning stiffness for 15 minutes.   Patient Denies nocturnal pain.  Difficulty  dressing/grooming: Denies Difficulty climbing stairs: Denies Difficulty getting out of chair: Denies Difficulty using hands for taps, buttons, cutlery, and/or writing: Denies   No Rheumatology ROS completed.   PMFS History:  Patient Active Problem List   Diagnosis Date Noted  . Raynaud's phenomenon without gangrene 10/25/2016  . Trochanteric bursitis of both hips 10/25/2016  . Gastroesophageal reflux disease  10/25/2016  . Former smoker, 10/25/2016  . Intracranial hemorrhage, spontaneous subarachnoid, idiopathic, chronic ([place]) 09/02/2015  . 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 HYPERCHOLESTEROLEMIA 11/18/2008  . Depression 11/18/2008  . IRRITABLE BOWEL SYNDROME 11/18/2008  . Morphea 11/18/2008  . Primary osteoarthritis of both hands 11/18/2008  . Age related osteoporosis 11/18/2008  . MIGRAINES, HX OF 11/18/2008    Past Medical History:  Diagnosis Date  . Anxiety   . 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 Brain bleed ([place])   . Circumscribed scleroderma   . Depression   . Diverticulosis   . GERD (gastroesophageal reflux disease)   . Hyperlipidemia   . IBS (irritable bowel syndrome)   . Memory loss   . Migraines   . Osteoarthritis   . Osteoporosis   . Stroke [facility]) 2015    Family History  Problem Relation Age of Onset  . Heart disease Father   . Liver  cancer Paternal Aunt   . Prostate cancer Paternal Grandfather   . Colon cancer Neg Hx    Past Surgical History:  Procedure Laterality Date  . broken ankle Left   . TUBAL LIGATION     Social History   Social History Narrative   Daily caffeine, 2 glasses daily,   Right handed, Married, 2 sons. Retired. College.     Objective: Vital Signs: BP 108/69   Pulse 76   Resp 14   Ht 5\' 4"  (1.626 m)   Wt 147 lb (66.7 kg)   BMI 25.23 kg/m    Physical Exam   Musculoskeletal Exam:  Full range of motion of all joints Grip strength is equal and strong  bilaterally Fibromyalgia tender points are all absent  CDAI Exam: No CDAI exam completed.  No synovitis on examination  Investigation:  Labs done 01/26/2017 at PCPs office include CMP with GFR, CBC with differential, lipid panel, TSH, vitamin D.  See scanned document in patient's chart  No additional findings. No visits with results within 6 Month(s) from this visit.  Latest known visit with results is:  Office Visit on 11/27/2014  Component Date Value Ref Range Status  . Glucose 11/27/2014 80  65 - 99 mg/dL Final  . BUN 11/27/2014 24  8 - 27 mg/dL Final  . Creatinine, Ser 11/27/2014 0.78  0.57 - 1.00 mg/dL Final  . GFR calc non Af Amer 11/27/2014 79  >59 mL/min/1.73 Final  . GFR calc Af Amer 11/27/2014 91  >59 mL/min/1.73 Final  . BUN/Creatinine Ratio 11/27/2014 31* 11 - 26 Final  . Sodium 11/27/2014 136  134 - 144 mmol/L Final  . Potassium 11/27/2014 4.9  3.5 - 5.2 mmol/L Final  . Chloride 11/27/2014 98  97 - 108 mmol/L Final  . CO2 11/27/2014 23  18 - 29 mmol/L Final  . Calcium 11/27/2014 9.3  8.7 - 10.3 mg/dL Final     Imaging: No results found.   Extensive morphia to back and abdomen; see pictures below On occasion, it itches. Patient uses triamcinolone cream when necessary for short periods She also uses moisturizers like Aquaphor, Euceri              Speciality Comments: No specialty comments available.    Procedures:  No procedures performed Allergies: Patient has no known allergies.   Assessment / Plan:     Visit Diagnoses: Morphea  Raynaud's phenomenon without gangrene  Age-related osteoporosis without current pathological fracture  Trochanteric bursitis of both hips   Plan: #1: Morphia. Stable. Patient has tried methotrexate in the past but it did not give her adequate response. The texture improve but new lesions continue to come up. Then in 2016, we offered her CellCept but patient declined  #2: Raynaud's. No flare.  #3:  Osteoporosis. Has done nearly 2 years of Forteo. Did not want to continue Forteo. PCP is managing her osteoporosis. Has recently done the DEXA and patient recalls that it was normal. We do not have copy of this in the chart Patient is doing Reclast annually  #4: Right lateral ankle pain for the last 6 months. Pain off and on. Patient continues to be able to bear weight without difficulty. She will try Voltaren gel, ankle exercise, wear her brace. Is not neither referral to orthopedist at this time. Has a history of left ankle fracture which was repaired by Dr. Durward Fortes and patient will go to Dr. Durward Fortes of the right ankle continues to be a problem.  #5:  Labs are up-to-date and within normal limits. Done at PCPs office February 2018. See scanned documents. Labs done 01/26/2017 at PCPs office include CMP with GFR, CBC with differential, lipid panel, TSH, vitamin D.  See scanned document in patient's chart  Orders: No orders of the defined types were placed in this encounter.  No orders of the defined types were placed in this encounter.   Face-to-face time spent with patient was 30 minutes. 50% of time was spent in counseling and coordination of care.  Follow-Up Instructions: No Follow-up on file.   Eliezer Lofts, PA-C  Note - This record has been created using [employer].  Chart creation errors have been sought, but may not always  have been located. Such creation errors do not reflect on  the standard of medical care.

## 2017-06-06 ENCOUNTER — Encounter: Payer: Self-pay | Admitting: Gastroenterology

## 2017-06-06 ENCOUNTER — Ambulatory Visit (INDEPENDENT_AMBULATORY_CARE_PROVIDER_SITE_OTHER): Payer: Medicare Other | Admitting: Gastroenterology

## 2017-06-06 VITALS — BP 100/72 | HR 76 | Ht 64.0 in | Wt 143.6 lb

## 2017-06-06 DIAGNOSIS — K581 Irritable bowel syndrome with constipation: Secondary | ICD-10-CM | POA: Diagnosis not present

## 2017-06-06 DIAGNOSIS — K219 Gastro-esophageal reflux disease without esophagitis: Secondary | ICD-10-CM

## 2017-06-06 DIAGNOSIS — D120 Benign neoplasm of cecum: Secondary | ICD-10-CM

## 2017-06-06 DIAGNOSIS — R05 Cough: Secondary | ICD-10-CM

## 2017-06-06 DIAGNOSIS — K5730 Diverticulosis of large intestine without perforation or abscess without bleeding: Secondary | ICD-10-CM

## 2017-06-06 DIAGNOSIS — R053 Chronic cough: Secondary | ICD-10-CM

## 2017-06-06 NOTE — Progress Notes (Signed)
     [facility] Progress Note  Chief Complaint: Chronic cough and GERD  Subjective  History:   Heather Lindsey follows up for chronic cough and GERD as well as chronic lower abdominal pain. EGD last month revealed a patulous LES and some fundic gland polyps. Colonoscopy revealed diverticulosis and a cecal sessile serrated polyp. There was also a polypoid abnormality at the anal verge that did not look malignant. I referred her to Dr. Henri Medal of [facility] surgery who felt that it was an inflamed hemorrhoid.  Heather Lindsey is feeling well these days. She says her cough is improved with control of her allergies and re-dosing of her pantoprazole to suppertime. She is also considering getting a phone bed wedge to elevate her head of bed. She is good about not eating within a few hours of going to bed and other diet and lifestyle anti-GERD measures. Heather Lindsey denies dysphagia, early satiety, nausea, vomiting or weight loss. She has intermittent left lower quadrant pain felt related to diverticular spasm or IBS, and response to usual use of hyoscyamine. She has occasional constipation that she treats with dietary fiber. Heather Lindsey also tends to be bloated and gassy and has learned a lot of the foods that trigger this. Gave her some additional dietary recommendations to review.  ROS: Cardiovascular:  no chest pain Respiratory: no dyspnea  The patient's Past Medical, Family and Social History were reviewed and are on file in the EMR.  Objective:  Med list reviewed  Vital signs in last 24 hrs: Vitals:   06/06/17 0823  BP: 100/72  Pulse: 76    Physical Exam    HEENT: sclera anicteric, oral mucosa moist without lesions. Normal vocal quality  Neck: supple, no thyromegaly, JVD or lymphadenopathy  Cardiac: RRR without murmurs, S1S2 heard, no peripheral edema  Pulm: clear to auscultation bilaterally, normal RR and effort noted  Abdomen: soft, No tenderness, with active bowel sounds. No guarding or  palpable hepatosplenomegaly.  Skin; warm and dry, no jaundice or rash  Colon polyp pathology as noted above  @ASSESSMENTPLANBEGIN @ Assessment: Encounter Diagnoses  Name Primary?  . Gastroesophageal reflux disease  Yes  . Chronic cough   . Irritable bowel syndrome with constipation   . Diverticulosis of colon without hemorrhage   . Benign neoplasm of cecum    Symptoms are under good control. I suspect her cough is a combination of nocturnal GERD and allergies.  She has mild, intermittent IBS symptoms under control with anti-spasmodic therapy.  Continue current therapy and see me in a year or sooner as needed.  Recall colonoscopy in 5 years  Total time 15 minutes, over half spent in counseling and coordination of care.   [name]

## 2017-06-06 NOTE — Patient Instructions (Addendum)
If you are age 70 or older, your body mass index should be between 23-30. Your Body mass index is 24.65 kg/m. If this is out of the aforementioned range listed, please consider follow up with your Primary Care Provider.  If you are age 70 or younger, your body mass index should be between 19-25. Your Body mass index is 24.65 kg/m. If this is out of the aformentioned range listed, please consider follow up with your Primary Care Provider.    Food Guidelines for a sensitive stomach  Many people have difficulty digesting certain foods, causing a variety of distressing and embarrassing symptoms such as abdominal pain, bloating and gas.  These foods may need to be avoided or consumed in small amounts.  Here are some tips that might be helpful for you.  1.   Lactose intolerance is the difficulty or complete inability to digest lactose, the natural sugar in milk and anything made from milk.  This condition is harmless, common, and can begin any time during life.  Some people can digest a modest amount of lactose while others cannot tolerate any.  Also, not all dairy products contain equal amounts of lactose.  For example, hard cheeses such as parmesan have less lactose than soft cheeses such as cheddar.  Yogurt has less lactose than milk or cheese.  Many packaged foods (even many brands of bread) have milk, so read ingredient lists carefully.  It is difficult to test for lactose intolerance, so just try avoiding lactose as much as possible for a week and see what happens with your symptoms.  If you seem to be lactose intolerant, the best plan is to avoid it (but make sure you get calcium from another source).  The next best thing is to use lactase enzyme supplements, available over the counter everywhere.  Just know that many lactose intolerant people need to take several tablets with each serving of dairy to avoid symptoms.  Lastly, a lot of restaurant food is made with milk or butter.  Many are things you  might not suspect, such as mashed potatoes, rice and pasta (cooked with butter) and "grilled" items.  If you are lactose intolerant, it never hurts to ask your server what has milk or butter.  2.   Fiber is an important part of your diet, but not all fiber is well-tolerated.  Insoluble fiber such as bran is often consumed by normal gut bacteria and converted into gas.  Soluble fiber such as oats, squash, carrots and green beans are typically tolerated better.  3.   Some types of carbohydrates can be poorly digested.  Examples include: fructose (apples, cherries, pears, raisins and other dried fruits), fructans (onions, zucchini, large amounts of wheat), sorbitol/mannitol/xylitol and sucralose/Splenda (common artificial sweeteners), and raffinose (lentils, broccoli, cabbage, asparagus, brussel sprouts, many types of beans).  Do a Development worker, community for [employer] and you will find helpful information. Beano, a dietary supplement, will often help with raffinose-containing foods.  As with lactase tablets, you may need several per serving.  4.   Whenever possible, avoid processed food&meats and chemical additives.  High fructose corn syrup, a common sweetener, may be difficult to digest.  Eggs and soy (comes from the soybean, and added to many foods now) are the other most common bloating/gassy foods.  - Dr. [facility]

## 2017-06-22 ENCOUNTER — Telehealth: Payer: Self-pay | Admitting: Radiology

## 2017-06-22 ENCOUNTER — Other Ambulatory Visit: Payer: Self-pay | Admitting: Rheumatology

## 2017-06-22 NOTE — Telephone Encounter (Signed)
Sent in for Celebrex for her, have called patient to tell her to take one daily prn instead of bid.No answer,will have to try again later

## 2017-06-22 NOTE — Telephone Encounter (Signed)
Should be qd prn

## 2017-06-22 NOTE — Telephone Encounter (Signed)
Sent in for Celebrex for her, have called patient to tell her to take one daily prn. No answer

## 2017-06-22 NOTE — Telephone Encounter (Signed)
05/25/17 last visit  10/25/17 next visit  Labs CBC CMP by PCP on 01/26/17 WNL   This Rx is written for 200 mg bid, do you want to continue this dose ?

## 2017-06-23 NOTE — Telephone Encounter (Signed)
Attempted to contact the patient. No answer. 

## 2017-06-27 NOTE — Telephone Encounter (Signed)
Patient called back and advised prescription for Celebrex was sent to the pharmacy for one daily as needed. Patient states she has been taking Celebrex only once daily.

## 2017-06-27 NOTE — Telephone Encounter (Signed)
Attempted to contact the patient and left message for patient to call the office.  

## 2017-08-24 ENCOUNTER — Other Ambulatory Visit (HOSPITAL_COMMUNITY): Payer: Self-pay | Admitting: *Deleted

## 2017-08-25 ENCOUNTER — Ambulatory Visit (HOSPITAL_COMMUNITY)
Admission: RE | Admit: 2017-08-25 | Discharge: 2017-08-25 | Disposition: A | Discharge status: Home / Self Care | Payer: Medicare Other | Source: Ambulatory Visit | Attending: Internal Medicine | Admitting: Internal Medicine

## 2017-08-25 DIAGNOSIS — M810 Age-related osteoporosis without current pathological fracture: Secondary | ICD-10-CM | POA: Insufficient documentation

## 2017-08-25 MED ORDER — ZOLEDRONIC ACID 5 MG/100ML IV SOLN
INTRAVENOUS | Status: AC
  Administered 2017-08-25: 5 mg via INTRAVENOUS
  Filled 2017-08-25: qty 100

## 2017-08-25 MED ORDER — ZOLEDRONIC ACID 5 MG/100ML IV SOLN
5.0000 mg | Freq: Once | INTRAVENOUS | Status: AC
  Administered 2017-08-25: 5 mg via INTRAVENOUS

## 2017-08-25 NOTE — Discharge Instructions (Signed)

## 2017-09-18 ENCOUNTER — Telehealth: Payer: Self-pay | Admitting: Gastroenterology

## 2017-09-18 NOTE — Telephone Encounter (Signed)
Contacted Heather Lindsey, she is doing well on the Protonix. She only uses one a day now 30 min before supper. She states she is managing well. Can we send refills to [place]? Last seen 05-2017

## 2017-09-19 MED ORDER — PANTOPRAZOLE SODIUM 40 MG PO TBEC: 40.0000 mg | DELAYED_RELEASE_TABLET | Freq: Every day | ORAL | 3 refills | Status: DC

## 2017-09-19 NOTE — Telephone Encounter (Signed)
Yes, that would be fine, thanks.  - HD

## 2017-09-19 NOTE — Telephone Encounter (Signed)
Refilled as directed.  

## 2017-10-15 NOTE — Progress Notes (Signed)
Office Visit Note  Patient: Heather Lindsey             Date of Birth: 1946-06-30           MRN: [id number]             PCP: Prince Solian, MD Referring: Prince Solian, MD Visit Date: 10/25/2017 Occupation: @GUAROCC @    Subjective:  Raynauds and pain in hands.   History of Present Illness: Heather Lindsey is a 71 y.o. female with history of morphea, Raynauds and osteoarthritis. She states is difficult for her to say if she is developing any new rash or morphea as her rash is quite extensive. Her Raynaud's is better on Norvasc. She's been having some discomfort in her hands especially her left hand. The trochanteric bursitis comes and goes.  Activities of Daily Living:  Patient reports morning stiffness for 30 minutes.   Patient Reports nocturnal pain. Bilateral trochanteric pain Difficulty dressing/grooming: Denies Difficulty climbing stairs: Denies Difficulty getting out of chair: Denies Difficulty using hands for taps, buttons, cutlery, and/or writing: Denies   Review of Systems  Constitutional: Positive for fatigue. Negative for night sweats, weight gain, weight loss and weakness.  HENT: Positive for mouth dryness. Negative for mouth sores, trouble swallowing, trouble swallowing and nose dryness.   Eyes: Negative for pain, redness, visual disturbance and dryness.  Respiratory: Negative for cough, shortness of breath and difficulty breathing.   Cardiovascular: Negative for chest pain, palpitations, hypertension, irregular heartbeat and swelling in legs/feet.  Gastrointestinal: Negative for blood in stool, constipation and diarrhea.  Endocrine: Negative for increased urination.  Genitourinary: Negative for vaginal dryness.  Musculoskeletal: Positive for arthralgias, joint pain and morning stiffness. Negative for joint swelling, myalgias, muscle weakness, muscle tenderness and myalgias.  Skin: Positive for color change and rash. Negative for hair loss, skin tightness, ulcers and  sensitivity to sunlight.  Allergic/Immunologic: Negative for susceptible to infections.  Neurological: Negative for dizziness, memory loss and night sweats.  Hematological: Negative for swollen glands.  Psychiatric/Behavioral: Positive for depressed mood. Negative for sleep disturbance. The patient is nervous/anxious.     PMFS History:  Patient Active Problem List   Diagnosis Date Noted  . Raynaud's phenomenon without gangrene 10/25/2016  . Trochanteric bursitis of both hips 10/25/2016  . Gastroesophageal reflux disease  10/25/2016  . Former smoker, 10/25/2016  . Intracranial hemorrhage, spontaneous subarachnoid, idiopathic, chronic ([place]) 09/02/2015  . 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 HYPERCHOLESTEROLEMIA 11/18/2008  . Depression 11/18/2008  . IRRITABLE BOWEL SYNDROME 11/18/2008  . Morphea 11/18/2008  . Primary osteoarthritis of both hands 11/18/2008  . Age related osteoporosis 11/18/2008  . MIGRAINES, HX OF 11/18/2008    Past Medical History:  Diagnosis Date  . Anxiety   . 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 Brain bleed ([place])   . Circumscribed scleroderma   . Depression   . Diverticulosis   . GERD (gastroesophageal reflux disease)   . Hyperlipidemia   . IBS (irritable bowel syndrome)   . Memory loss   . Migraines   . Osteoarthritis   . Osteoporosis   . Stroke [facility]) 2015    Family History  Problem Relation Age of Onset  . Heart disease Father   . Liver cancer Paternal Aunt   . Prostate cancer Paternal Grandfather   . Colon cancer Neg Hx    Past Surgical History:  Procedure Laterality Date  . broken ankle Left   . TUBAL  LIGATION     Social History   Social History Narrative   Daily caffeine, 2 glasses daily,   Right handed, Married, 2 sons. Retired. College.     Objective: Vital Signs: BP 132/85 (BP Location: Left Arm, Patient Position: Sitting, Cuff Size: Normal)   Pulse 88   Resp 15    Ht 5' 3.5" (1.613 m)   Wt 149 lb (67.6 kg)   BMI 25.98 kg/m    Physical Exam  Constitutional: She is oriented to person, place, and time. She appears well-developed and well-nourished.  HENT:  Head: Normocephalic and atraumatic.  Eyes: Conjunctivae and EOM are normal.  Neck: Normal range of motion.  Cardiovascular: Normal rate, regular rhythm, normal heart sounds and intact distal pulses.  Pulmonary/Chest: Effort normal and breath sounds normal.  Abdominal: Soft. Bowel sounds are normal.  Lymphadenopathy:    She has no cervical adenopathy.  Neurological: She is alert and oriented to person, place, and time.  Skin: Skin is warm and dry. Capillary refill takes less than 2 seconds. Rash noted.  Scattered patches of hyper and hypopigmentation from morphia on extremities and trunk  Psychiatric: She has a normal mood and affect. Her behavior is normal.  Nursing note and vitals reviewed.    Musculoskeletal Exam: C-spine and thoracic lumbar spine good range of motion. She is some discomfort range of motion of her lumbar spine. Shoulder joints elbow joints wrist joint MCPs PIPs DIPs with good range of motion with no synovitis. She has thickening of PIP/DIP joints in her hands consistent with osteoarthritis. Hip joints knee joints ankles MTPs PIPs with good range of motion. She is tenderness on palpation of bilateral trochanteric bursa.  CDAI Exam: No CDAI exam completed.    Investigation: No additional findings.   Imaging: No results found.  Speciality Comments: No specialty comments available.    Procedures:  No procedures performed Allergies: Patient has no known allergies.   Assessment / Plan:     Visit Diagnoses: Morphea: She has extensive rash from morphea on her trunk and extremities. She has few lesions on her face and neck as well. It is difficult to assess the rash is getting worse or she is developing any new lesions as the rash is extensive.  Raynaud's phenomenon  without gangrene: Her symptoms are much better on Norvasc.  Primary osteoarthritis of both hands: Joint protection and muscle strengthening discussed.she has been having increased discomfort in her CMC joints. Given her prescription for left CMC brace. Use of Voltaren gel was also discussed.  Trochanteric bursitis of both hips: She continues to have trochanter pain. She's tried physical therapy in the past. She has handout on exercises which she can do at home.  Chronic midline low back pain without sciatica - on Baclofen when necessary,and Tramadol prn  Age-related osteoporosis without current pathological fracture - Reclast given in September 2018 by her PCP.   Other medical problems are listed as follows: History of gastroesophageal reflux (GERD)  History of depression  History of migraine  History of IBS  History of hypercholesterolemia    Orders: No orders of the defined types were placed in this encounter.  No orders of the defined types were placed in this encounter.   Face-to-face time spent with patient was 30 minutes. Greater than 50% of time was spent in counseling and coordination of care.  Follow-Up Instructions: Return in about 6 months (around 04/24/2018) for Morphea, Raynauds, OA.   Bo Merino, MD  Note - This record has been  created using [employer].  Chart creation errors have been sought, but may not always  have been located. Such creation errors do not reflect on  the standard of medical care.

## 2017-10-25 ENCOUNTER — Ambulatory Visit: Payer: Medicare Other | Admitting: Rheumatology

## 2017-10-25 ENCOUNTER — Encounter: Payer: Self-pay | Admitting: Rheumatology

## 2017-10-25 VITALS — BP 132/85 | HR 88 | Resp 15 | Ht 63.5 in | Wt 149.0 lb

## 2017-10-25 DIAGNOSIS — Z8719 Personal history of other diseases of the digestive system: Secondary | ICD-10-CM | POA: Diagnosis not present

## 2017-10-25 DIAGNOSIS — M7062 Trochanteric bursitis, left hip: Secondary | ICD-10-CM

## 2017-10-25 DIAGNOSIS — M545 Low back pain, unspecified: Secondary | ICD-10-CM

## 2017-10-25 DIAGNOSIS — M7061 Trochanteric bursitis, right hip: Secondary | ICD-10-CM

## 2017-10-25 DIAGNOSIS — M810 Age-related osteoporosis without current pathological fracture: Secondary | ICD-10-CM

## 2017-10-25 DIAGNOSIS — I7300 Raynaud's syndrome without gangrene: Secondary | ICD-10-CM

## 2017-10-25 DIAGNOSIS — Z8639 Personal history of other endocrine, nutritional and metabolic disease: Secondary | ICD-10-CM | POA: Diagnosis not present

## 2017-10-25 DIAGNOSIS — Z8659 Personal history of other mental and behavioral disorders: Secondary | ICD-10-CM

## 2017-10-25 DIAGNOSIS — L940 Localized scleroderma [morphea]: Secondary | ICD-10-CM | POA: Diagnosis not present

## 2017-10-25 DIAGNOSIS — M19042 Primary osteoarthritis, left hand: Secondary | ICD-10-CM | POA: Diagnosis not present

## 2017-10-25 DIAGNOSIS — G8929 Other chronic pain: Secondary | ICD-10-CM

## 2017-10-25 DIAGNOSIS — Z8669 Personal history of other diseases of the nervous system and sense organs: Secondary | ICD-10-CM

## 2017-10-25 DIAGNOSIS — M19041 Primary osteoarthritis, right hand: Secondary | ICD-10-CM | POA: Diagnosis not present

## 2018-04-09 ENCOUNTER — Other Ambulatory Visit: Payer: Self-pay | Admitting: Rheumatology

## 2018-04-09 NOTE — Telephone Encounter (Signed)
Last Visit: 10/25/17 Next Visit: 04/25/18 Labs: 02/21/18 cbc/cmp stable  Okay to refill per Dr. Estanislado Pandy

## 2018-04-11 NOTE — Progress Notes (Signed)
Office Visit Note  Patient: Heather Lindsey             Date of Birth: 06-25-1946           MRN: [id number]             PCP: Prince Solian, MD Referring: Prince Solian, MD Visit Date: 04/25/2018 Occupation: @GUAROCC @    Subjective:  morphea    History of Present Illness: Heather Lindsey is a 72 y.o. female with history of morphea, Raynaud's, osteoarthritis, and osteoporosis.  Patient reports that she continues to have diffuse morphea on her trunk.  She states she continues to get new lesions.  She states that some lesions will become dry and itchy at times.  She states that she continues to have Raynauds in her fingers and takes Norvasc all-around which helps.  She denies any digital ulcerations.  She denies any sores in her mother knows.  She denies any swollen lymph nodes.  She denies any facial rashes.  She denies any skin tightness at this time.  She states that over the past 2 months she has developed more hand pain and stiffness.  She states that her hands will occasionally swell.  She chronically has trochanteric bursitis.  She has been to physical therapy in the past and perform stretching exercises on a regular basis.  She states that her lower back cause discomfort on and off.  She takes baclofen and tramadol as needed for pain relief.   Activities of Daily Living:  Patient reports morning stiffness for   all day.   Patient Reports nocturnal pain.  Difficulty dressing/grooming: Denies Difficulty climbing stairs: Denies Difficulty getting out of chair: Denies Difficulty using hands for taps, buttons, cutlery, and/or writing: Reports   Review of Systems  Constitutional: Negative for fatigue.  HENT: Negative for mouth sores, mouth dryness and nose dryness.   Eyes: Positive for dryness. Negative for pain and visual disturbance.  Respiratory: Positive for cough (Hx of chronic cough due to GERD). Negative for hemoptysis, shortness of breath and difficulty breathing.     Cardiovascular: Negative for chest pain, palpitations, hypertension and swelling in legs/feet.  Gastrointestinal: Positive for constipation (Hx of IBS) and diarrhea. Negative for blood in stool.  Endocrine: Negative for increased urination.  Genitourinary: Negative for painful urination.  Musculoskeletal: Positive for arthralgias, joint pain, morning stiffness and muscle tenderness. Negative for joint swelling, myalgias, muscle weakness and myalgias.  Skin: Positive for color change and rash. Negative for pallor, hair loss, nodules/bumps, skin tightness, ulcers and sensitivity to sunlight.  Allergic/Immunologic: Negative for susceptible to infections.  Neurological: Negative for dizziness, numbness, headaches and weakness.  Hematological: Negative for swollen glands.  Psychiatric/Behavioral: Positive for depressed mood. Negative for sleep disturbance. The patient is nervous/anxious.     PMFS History:  Patient Active Problem List   Diagnosis Date Noted  . Raynaud's phenomenon without gangrene 10/25/2016  . Trochanteric bursitis of both hips 10/25/2016  . Gastroesophageal reflux disease  10/25/2016  . Former smoker, 10/25/2016  . 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 HYPERCHOLESTEROLEMIA 11/18/2008  . Depression 11/18/2008  . IRRITABLE BOWEL SYNDROME 11/18/2008  . Morphea 11/18/2008  . Primary osteoarthritis of both hands 11/18/2008  . Age related osteoporosis 11/18/2008  . MIGRAINES, HX OF 11/18/2008    Past Medical History:  Diagnosis Date  . Anxiety   . 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  Brain bleed ([place])   . Circumscribed scleroderma   . Depression   . Diverticulosis   . GERD (gastroesophageal reflux disease)   . Hyperlipidemia   . IBS (irritable bowel syndrome)   . Memory loss   . Migraines   . Osteoarthritis   .  Osteoporosis   . Stroke [facility]) 2015    Family History  Problem Relation Age of Onset  . Heart disease Father   . Liver cancer Paternal Aunt   . Prostate cancer Paternal Grandfather   . Colon cancer Neg Hx    Past Surgical History:  Procedure Laterality Date  . broken ankle Left   . TUBAL LIGATION     Social History   Social History Narrative   Daily caffeine, 2 glasses daily,   Right handed, Married, 2 sons. Retired. College.     Objective: Vital Signs: BP 127/82 (BP Location: Left Arm, Patient Position: Sitting, Cuff Size: Normal)   Pulse 76   Resp 14   Ht 5\' 3"  (1.6 m)   Wt 149 lb (67.6 kg)   BMI 26.39 kg/m    Physical Exam  Constitutional: She is oriented to person, place, and time. She appears well-developed and well-nourished.  HENT:  Head: Normocephalic and atraumatic.  Eyes: Conjunctivae and EOM are normal.  Neck: Normal range of motion.  Cardiovascular: Normal rate, regular rhythm, normal heart sounds and intact distal pulses.  Pulmonary/Chest: Effort normal and breath sounds normal.  Abdominal: Soft. Bowel sounds are normal.  Lymphadenopathy:    She has no cervical adenopathy.  Neurological: She is alert and oriented to person, place, and time.  Skin: Skin is warm and dry. Capillary refill takes less than 2 seconds.  Extensive morphea lesions on her trunk and extremities.  Psychiatric: She has a normal mood and affect. Her behavior is normal.  Nursing note and vitals reviewed.    Musculoskeletal Exam: C-spine thoracic lumbar spine good range of motion.  Shoulder joints elbow joints wrist joint MCPs PIPs DIPs were in good range of motion with no synovitis.  She has some DIP PIP thickening in her hands consistent with osteoarthritis.  Hip joints knee joints ankles MTPs PIPs were in good range of motion with no synovitis.  CDAI Exam: CDAI Homunculus Exam:   Joint Counts:  CDAI Tender Joint count: 0 CDAI Swollen Joint count: 0     Investigation: No  additional findings. Labs: 02/28/2018 elevated cholesterol   Imaging: No results found.  Speciality Comments: No specialty comments available.    Procedures:  No procedures performed Allergies: Patient has no known allergies.   Assessment / Plan:     Visit Diagnoses: Morphea-she does have extensive morphea.  She is uncertain if she is getting new or lesions.  She is concerned about the prominence of some of the lesions.  We had discussed methotrexate and CellCept in the past but she did not like the side effects.  I discussed possible referral to [facility] dermatology for evaluation.  She states she has not seen a dermatologist in more than 10 years and would like another appointment.  I will make the referral for her.  Raynaud's phenomenon without gangrene-manageable with warm clothing and drinking warm liquids.  Primary osteoarthritis of both hands-she does have osteoarthritic changes in her hands I do not see any synovitis.  Hand muscle exercises were discussed and demonstrated.  Age-related osteoporosis without current pathological fracture - Reclast given in September 2018 by her PCP.  Chronic midline low back pain without sciatica -  on Baclofen when necessary,and Tramadol prn  History of hypercholesterolemia-followed up by her PCP.  History of gastroesophageal reflux (GERD)  History of IBS  History of depression  History of migraine    Orders: Orders Placed This Encounter  Procedures  . Ambulatory referral to Dermatology   No orders of the defined types were placed in this encounter.   Face-to-face time spent with patient was 30 minutes.> 50% of time was spent in counseling and coordination of care.  Follow-Up Instructions: Return in about 6 months (around 10/26/2018) for Osteoarthritis, Morphea .   Bo Merino, MD  Note - This record has been created using [employer].  Chart creation errors have been sought, but may not always  have been located.  Such creation errors do not reflect on  the standard of medical care.

## 2018-04-25 ENCOUNTER — Ambulatory Visit: Payer: Medicare Other | Admitting: Rheumatology

## 2018-04-25 ENCOUNTER — Encounter: Payer: Self-pay | Admitting: Rheumatology

## 2018-04-25 VITALS — BP 127/82 | HR 76 | Resp 14 | Ht 63.0 in | Wt 149.0 lb

## 2018-04-25 DIAGNOSIS — L940 Localized scleroderma [morphea]: Secondary | ICD-10-CM

## 2018-04-25 DIAGNOSIS — I7300 Raynaud's syndrome without gangrene: Secondary | ICD-10-CM | POA: Diagnosis not present

## 2018-04-25 DIAGNOSIS — M810 Age-related osteoporosis without current pathological fracture: Secondary | ICD-10-CM

## 2018-04-25 DIAGNOSIS — G8929 Other chronic pain: Secondary | ICD-10-CM | POA: Diagnosis not present

## 2018-04-25 DIAGNOSIS — Z8669 Personal history of other diseases of the nervous system and sense organs: Secondary | ICD-10-CM | POA: Diagnosis not present

## 2018-04-25 DIAGNOSIS — M19041 Primary osteoarthritis, right hand: Secondary | ICD-10-CM

## 2018-04-25 DIAGNOSIS — M545 Low back pain, unspecified: Secondary | ICD-10-CM

## 2018-04-25 DIAGNOSIS — Z8659 Personal history of other mental and behavioral disorders: Secondary | ICD-10-CM | POA: Diagnosis not present

## 2018-04-25 DIAGNOSIS — M19042 Primary osteoarthritis, left hand: Secondary | ICD-10-CM

## 2018-04-25 DIAGNOSIS — Z8639 Personal history of other endocrine, nutritional and metabolic disease: Secondary | ICD-10-CM | POA: Diagnosis not present

## 2018-04-25 DIAGNOSIS — Z8719 Personal history of other diseases of the digestive system: Secondary | ICD-10-CM | POA: Diagnosis not present

## 2018-05-11 ENCOUNTER — Telehealth: Payer: Self-pay | Admitting: Rheumatology

## 2018-05-11 NOTE — Telephone Encounter (Signed)
[employer] calling to get verbal authorization for patients Diclofenac Tabs. Please use ref# [id number]. Call [phone]

## 2018-05-14 MED ORDER — BACLOFEN 10 MG PO TABS: ORAL_TABLET | ORAL | 0 refills | Status: DC

## 2018-05-14 NOTE — Telephone Encounter (Signed)
Contacted Clara, Pharmacist, He states the prescription the need refill for the patient is Baclofen.   Last Visit: 04/25/18 Next Visit: 10/31/18  Okay to refill per Dr. Encarnacion Chu verbal authorization.

## 2018-06-25 ENCOUNTER — Other Ambulatory Visit: Payer: Self-pay | Admitting: Gastroenterology

## 2018-06-25 ENCOUNTER — Other Ambulatory Visit: Payer: Self-pay | Admitting: Rheumatology

## 2018-06-25 NOTE — Telephone Encounter (Signed)
Spoke to pt. Advised for a yearly appointment for medication management. She states she will call back to schedule.

## 2018-06-25 NOTE — Telephone Encounter (Addendum)
Last Visit: 04/25/18 Next Visit: 10/31/18 Labs: 02/27/18 stable  Okay to refill per Dr. Estanislado Pandy

## 2018-07-10 ENCOUNTER — Other Ambulatory Visit: Payer: Self-pay | Admitting: *Deleted

## 2018-07-10 MED ORDER — BACLOFEN 10 MG PO TABS: ORAL_TABLET | ORAL | 0 refills | Status: DC

## 2018-07-10 NOTE — Telephone Encounter (Signed)
Refill request received via fax  Last Visit: 04/25/18 Next Visit: 10/31/18  Okay to refill per Dr. Estanislado Pandy

## 2018-08-28 ENCOUNTER — Other Ambulatory Visit: Payer: Self-pay | Admitting: Rheumatology

## 2018-08-28 NOTE — Telephone Encounter (Addendum)
Last Visit: 04/25/18 Next Visit: 10/31/18 Labs: 07/03/18 WNL  Okay to refill per Dr. Estanislado Pandy

## 2018-09-04 ENCOUNTER — Ambulatory Visit: Payer: Medicare Other | Admitting: Gastroenterology

## 2018-09-04 ENCOUNTER — Encounter: Payer: Self-pay | Admitting: Gastroenterology

## 2018-09-04 VITALS — BP 128/70 | HR 72 | Ht 63.0 in | Wt 147.0 lb

## 2018-09-04 DIAGNOSIS — R05 Cough: Secondary | ICD-10-CM

## 2018-09-04 DIAGNOSIS — K219 Gastro-esophageal reflux disease without esophagitis: Secondary | ICD-10-CM

## 2018-09-04 DIAGNOSIS — R053 Chronic cough: Secondary | ICD-10-CM

## 2018-09-04 DIAGNOSIS — K581 Irritable bowel syndrome with constipation: Secondary | ICD-10-CM | POA: Diagnosis not present

## 2018-09-04 MED ORDER — PANTOPRAZOLE SODIUM 40 MG PO TBEC: 40.0000 mg | DELAYED_RELEASE_TABLET | Freq: Every day | ORAL | 3 refills | Status: DC

## 2018-09-04 NOTE — Progress Notes (Signed)
Heather Lindsey  Chief Complaint: GERD and lower abdominal pain  Subjective  History:  Heather Lindsey was seen in June 2018 for GERD and chronic cough that seems at least partially allergy and postnasal drip related.  She also has left lower quadrant pain that responds to hyoscyamine and is consistent with IBS. EGD and colonoscopy done May 2018  Blue is feeling well, with occasional brief heartburn with certain foods such as tomatoes.  She denies dysphagia, odynophagia, nausea, vomiting, early satiety or weight loss.  She tends toward constipation, particularly if she travels.  If she does not have a BM for about 3 days, she will take some Senokot.  She tries to consume a high-fiber diet, and has rarely needed the hyoscyamine for lower abdominal cramps.  ROS: Cardiovascular:  no chest pain Respiratory: no dyspnea  The patient's Past Medical, Family and Social History were reviewed and are on file in the EMR.  Objective:  Med list reviewed  Current Outpatient Medications:  .  acetaminophen (TYLENOL) 500 MG tablet, Take 500 mg by mouth every 6 (six) hours as needed.  , Disp: , Rfl:  .  ALPRAZolam (XANAX) 0.5 MG tablet, Take 0.5 mg by mouth as needed. , Disp: , Rfl:  .  amLODipine (NORVASC) 5 MG tablet, Once a day po, watch for puffy feet !, Disp: 90 tablet, Rfl: 3 .  baclofen (LIORESAL) 10 MG tablet, TAKE 1 TABLET BY MOUTH AT  8AM AND 2PM AS NEEDED, Disp: 120 tablet, Rfl: 0 .  calcium carbonate (TUMS - DOSED IN MG ELEMENTAL CALCIUM) 500 MG chewable tablet, Chew 1 tablet by mouth as needed.  , Disp: , Rfl:  .  Calcium Citrate-Vitamin D 500-250 MG-UNIT PACK, Take 1 capsule by mouth daily.  , Disp: , Rfl:  .  celecoxib (CELEBREX) 200 MG capsule, TAKE 1 CAPSULE BY MOUTH  DAILY AS NEEDED, Disp: 90 capsule, Rfl: 0 .  Cholecalciferol (VITAMIN D) 2000 UNITS CAPS, Take 1 capsule by mouth. 3 time a week , Disp: , Rfl:  .  fluticasone (FLONASE) 50 MCG/ACT nasal spray, Place 1 spray into  both nostrils daily as needed for allergies or rhinitis., Disp: , Rfl:  .  folic acid (FOLVITE) 1 MG tablet, Take 1 mg by mouth daily., Disp: , Rfl:  .  Folic Acid-Vit E3-MOQ H47 (FOLGARD PO), Take 1 capsule by mouth daily.  , Disp: , Rfl:  .  hyoscyamine (LEVSIN) 0.125 MG tablet, Take 0.125 mg by mouth every 4 (four) hours as needed., Disp: , Rfl:  .  loratadine (CLARITIN) 10 MG tablet, Take 10 mg by mouth as needed.  , Disp: , Rfl:  .  methotrexate (RHEUMATREX) 10 MG tablet, Take 10 mg by mouth once a week. Caution: Chemotherapy. Protect from light., Disp: , Rfl:  .  Multiple Vitamins-Minerals (CENTRUM PO), Take 1 capsule by mouth daily.  , Disp: , Rfl:  .  pantoprazole (PROTONIX) 40 MG tablet, Take 1 tablet (40 mg total) by mouth daily., Disp: 90 tablet, Rfl: 3 .  traMADol (ULTRAM) 50 MG tablet, Take 100 mg as needed by mouth. , Disp: , Rfl:  .  venlafaxine (EFFEXOR) 75 MG tablet, Take 75 mg by mouth daily. , Disp: , Rfl:  .  Zoledronic Acid (RECLAST IV), Inject into the vein., Disp: , Rfl:   Current Facility-Administered Medications:  .  0.9 %  sodium chloride infusion, 500 mL, Intravenous, Continuous, Danis, Kirke Corin, MD   Vital signs in  last 24 hrs: Vitals:   09/04/18 0948  BP: 128/70  Pulse: 72    Physical Exam  Well-appearing, normal vocal quality  HEENT: sclera anicteric, oral mucosa moist without lesions  Neck: supple, no thyromegaly, JVD or lymphadenopathy  Cardiac: RRR without murmurs, S1S2 heard, no peripheral edema  Pulm: clear to auscultation bilaterally, normal RR and effort noted  Abdomen: soft, no tenderness, with active bowel sounds. No guarding or palpable hepatosplenomegaly.  Skin; warm and dry, no jaundice or rash  @ASSESSMENTPLANBEGIN @ Assessment: Encounter Diagnoses  Name Primary?  . Gastroesophageal reflux disease  Yes  . Chronic cough   . Irritable bowel syndrome with constipation    Chronic stable symptoms.  She is happy with the results of  taking Protonix 1 tablet at evening meal.  She does well with diet and lifestyle changes such as not eating within several hours of meals, elevating head of bed and trying to avoid offending foods as much as possible. I still feel her chronic cough has an allergic, postnasal drip component.  She has chronic constipation with occasional lower abdominal pain.  I recommended as needed use of MiraLAX, especially when traveling. She will see me in a year or sooner as needed.   Total time 15 minutes, over half spent face-to-face with patient in counseling and coordination of care.   Heather Lindsey

## 2018-09-04 NOTE — Patient Instructions (Signed)
If you are age 71 or older, your body mass index should be between 23-30. Your Body mass index is 26.04 kg/m. If this is out of the aforementioned range listed, please consider follow up with your Primary Care Provider.  If you are age 78 or younger, your body mass index should be between 19-25. Your Body mass index is 26.04 kg/m. If this is out of the aformentioned range listed, please consider follow up with your Primary Care Provider.   Follow up in one year!   It was a pleasure to see you today!  Dr. Loletha Carrow

## 2018-10-19 NOTE — Progress Notes (Signed)
Office Visit Note  Patient: Heather Lindsey             Date of Birth: [birth year]           MRN: [id number]             PCP: Prince Solian, MD Referring: Prince Solian, MD Visit Date: 10/31/2018 Occupation: @GUAROCC @  Subjective:  Morphea rash .  History of Present Illness: Heather Lindsey is a 72 y.o. female with history of morphea, Raynaud's, osteoarthritis, and osteoporosis.  Patient reports that she went to see Dr.Jorizzo.  Patient states that after discussion she decided to start on methotrexate.  She has been on methotrexate since July 2019.  She has noticed improvement in the texture of her morphea.  She has not noticed any new lesions.  Her Raynaud's symptoms are controlled with Norvasc.  He continues to have some stiffness in her hands due to osteoarthritis.  Activities of Daily Living:  Patient reports morning stiffness for 5 minutes.   Patient Denies nocturnal pain.  Difficulty dressing/grooming: Denies Difficulty climbing stairs: Denies Difficulty getting out of chair: Denies Difficulty using hands for taps, buttons, cutlery, and/or writing: Reports  Review of Systems  Constitutional: Positive for fatigue. Negative for night sweats, weight gain and weight loss.  HENT: Positive for mouth dryness. Negative for mouth sores, trouble swallowing, trouble swallowing and nose dryness.   Eyes: Positive for dryness. Negative for pain, redness and visual disturbance.  Respiratory: Negative for cough, shortness of breath and difficulty breathing.   Cardiovascular: Negative for chest pain, palpitations, hypertension, irregular heartbeat and swelling in legs/feet.  Gastrointestinal: Positive for constipation. Negative for blood in stool and diarrhea.  Endocrine: Negative for increased urination.  Genitourinary: Negative for vaginal dryness.  Musculoskeletal: Positive for arthralgias, joint pain and morning stiffness. Negative for joint swelling, myalgias, muscle weakness, muscle  tenderness and myalgias.  Skin: Positive for color change and rash. Negative for hair loss, skin tightness, ulcers and sensitivity to sunlight.  Allergic/Immunologic: Negative for susceptible to infections.  Neurological: Negative for dizziness, memory loss, night sweats and weakness.  Hematological: Negative for swollen glands.  Psychiatric/Behavioral: Positive for depressed mood. Negative for sleep disturbance. The patient is not nervous/anxious.     PMFS History:  Patient Active Problem List   Diagnosis Date Noted  . Raynaud's phenomenon without gangrene 10/25/2016  . Trochanteric bursitis of both hips 10/25/2016  . Gastroesophageal reflux disease  10/25/2016  . Former smoker, 10/25/2016  . Intracranial hemorrhage, spontaneous subarachnoid, idiopathic, chronic ([place]) 09/02/2015  . 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 HYPERCHOLESTEROLEMIA 11/18/2008  . Depression 11/18/2008  . IRRITABLE BOWEL SYNDROME 11/18/2008  . Morphea 11/18/2008  . Primary osteoarthritis of both hands 11/18/2008  . Age related osteoporosis 11/18/2008  . MIGRAINES, HX OF 11/18/2008    Past Medical History:  Diagnosis Date  . Anxiety   . 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 Brain bleed ([place])   . Circumscribed scleroderma   . Depression   . Diverticulosis   . GERD (gastroesophageal reflux disease)   . Hyperlipidemia   . IBS (irritable bowel syndrome)   . Memory loss   . Migraines   . Osteoarthritis   . Osteoporosis   . Stroke [facility]) 2015    Family History  Problem Relation Age of Onset  . Heart disease Father   . Liver cancer Paternal Aunt   . Prostate cancer Paternal Grandfather   . Colon cancer  Neg Hx   . Stomach cancer Neg Hx    Past Surgical History:  Procedure Laterality Date  . broken ankle Left   . TUBAL LIGATION     Social History   Social History Narrative   Daily caffeine, 2 glasses daily,   Right handed, Married, 2  sons. Retired. College.    Objective: Vital Signs: BP (!) 146/80 (BP Location: Left Arm, Patient Position: Sitting, Cuff Size: Normal)   Pulse 86   Resp 14   Ht 5' 3.5" (1.613 m)   Wt 148 lb (67.1 kg)   BMI 25.81 kg/m    Physical Exam  Constitutional: She is oriented to person, place, and time. She appears well-developed and well-nourished.  HENT:  Head: Normocephalic and atraumatic.  Eyes: Conjunctivae and EOM are normal.  Neck: Normal range of motion.  Cardiovascular: Normal rate, regular rhythm, normal heart sounds and intact distal pulses.  Pulmonary/Chest: Effort normal and breath sounds normal.  Abdominal: Soft. Bowel sounds are normal.  Lymphadenopathy:    She has no cervical adenopathy.  Neurological: She is alert and oriented to person, place, and time.  Skin: Skin is warm and dry. Capillary refill takes less than 2 seconds. Rash noted.  Morphea rash -hyper and hypopigmented lesions on extremities and trunk.  Psychiatric: She has a normal mood and affect. Her behavior is normal.  Nursing note and vitals reviewed.    Musculoskeletal Exam: Spine thoracic lumbar spine good range of motion.  Shoulder joints elbow joints wrist joint MCPs PIPs were in good range of motion.  She has some DIP thickening due to osteoarthritis.  Hip joints knee joints ankles MTPs PIPs were in good range of motion with no synovitis.  CDAI Exam: CDAI Score: Not documented Patient Global Assessment: Not documented; Provider Global Assessment: Not documented Swollen: Not documented; Tender: Not documented Joint Exam   Not documented   There is currently no information documented on the homunculus. Go to the Rheumatology activity and complete the homunculus joint exam.  Investigation: No additional findings.  Imaging: No results found.  Recent Labs: Lab Results  Component Value Date   NA 136 11/27/2014   K 4.9 11/27/2014   CL 98 11/27/2014   CO2 23 11/27/2014   GLUCOSE 80 11/27/2014     BUN 24 11/27/2014   CREATININE 0.78 11/27/2014   CALCIUM 9.3 11/27/2014   GFRAA 91 11/27/2014    Speciality Comments: No specialty comments available.  Procedures:  No procedures performed Allergies: Patient has no known allergies.   Assessment / Plan:     Visit Diagnoses: Morphea -patient has noted improvement in her morphea since she started methotrexate.  We had discussed methotrexate and CellCept in the past but she did not like the side effects.  After her recent visit with Dr. Sharol Roussel, she started methotrexate July.  Patient states that he has been monitoring her labs.  High risk medication use - MTX 10 tabs po q wk, Folic acid rxd by Dr. Sharol Roussel.  Patient has been experiencing some burning sensation in her mouth and fatigue.  She has been taking over-the-counter folic acid.  Have advised to take folic acid total at least 2 mg a day.  She will continue to get labs through her dermatologist and PCP.  Raynaud's phenomenon without gangrene-her symptoms are better on nifedipine which she will continue.  Keeping her hands warm and warm clothing was discussed during the wintertime.  Primary osteoarthritis of both hands-joint protection muscle strengthening was discussed.  Age-related osteoporosis  without current pathological fracture - Reclast given  by her PCP.  Use of calcium vitamin D and resistive exercise were discussed.  Chronic midline low back pain without sciatica - on Baclofen when necessary,and Tramadol prn  History of migraine  History of gastroesophageal reflux (GERD)  History of hypercholesterolemia - followed up by her PCP.  History of IBS  History of depression   Orders: No orders of the defined types were placed in this encounter.  No orders of the defined types were placed in this encounter.   Face-to-face time spent with patient was 30 minutes. Greater than 50% of time was spent in counseling and coordination of care.  Follow-Up Instructions: Return in  about 6 months (around 05/01/2019).   Bo Merino, MD  Note - This record has been created using Editor, commissioning.  Chart creation errors have been sought, but may not always  have been located. Such creation errors do not reflect on  the standard of medical care.

## 2018-10-31 ENCOUNTER — Ambulatory Visit: Payer: Medicare Other | Admitting: Rheumatology

## 2018-10-31 ENCOUNTER — Encounter: Payer: Self-pay | Admitting: Rheumatology

## 2018-10-31 VITALS — BP 146/80 | HR 86 | Resp 14 | Ht 63.5 in | Wt 148.0 lb

## 2018-10-31 DIAGNOSIS — I7300 Raynaud's syndrome without gangrene: Secondary | ICD-10-CM

## 2018-10-31 DIAGNOSIS — M545 Low back pain, unspecified: Secondary | ICD-10-CM

## 2018-10-31 DIAGNOSIS — M19041 Primary osteoarthritis, right hand: Secondary | ICD-10-CM

## 2018-10-31 DIAGNOSIS — G8929 Other chronic pain: Secondary | ICD-10-CM

## 2018-10-31 DIAGNOSIS — M19042 Primary osteoarthritis, left hand: Secondary | ICD-10-CM

## 2018-10-31 DIAGNOSIS — Z8669 Personal history of other diseases of the nervous system and sense organs: Secondary | ICD-10-CM

## 2018-10-31 DIAGNOSIS — L940 Localized scleroderma [morphea]: Secondary | ICD-10-CM

## 2018-10-31 DIAGNOSIS — Z79899 Other long term (current) drug therapy: Secondary | ICD-10-CM | POA: Diagnosis not present

## 2018-10-31 DIAGNOSIS — Z8659 Personal history of other mental and behavioral disorders: Secondary | ICD-10-CM

## 2018-10-31 DIAGNOSIS — Z8639 Personal history of other endocrine, nutritional and metabolic disease: Secondary | ICD-10-CM

## 2018-10-31 DIAGNOSIS — M810 Age-related osteoporosis without current pathological fracture: Secondary | ICD-10-CM

## 2018-10-31 DIAGNOSIS — Z8719 Personal history of other diseases of the digestive system: Secondary | ICD-10-CM

## 2018-11-19 ENCOUNTER — Other Ambulatory Visit: Payer: Self-pay | Admitting: Rheumatology

## 2018-11-19 NOTE — Telephone Encounter (Signed)
Last Visit: 10/31/18 Next Visit: 04/30/18 Labs:07/03/18 WNL( In care Everywhere )  Okay to refill per Dr.Deveshwar

## 2019-01-05 ENCOUNTER — Inpatient Hospital Stay (HOSPITAL_BASED_OUTPATIENT_CLINIC_OR_DEPARTMENT_OTHER)
Admission: EM | Admit: 2019-01-05 | Discharge: 2019-01-07 | DRG: 470 | Disposition: A | Discharge status: Home Health | Payer: Medicare Other | Attending: Internal Medicine | Admitting: Internal Medicine

## 2019-01-05 ENCOUNTER — Encounter (HOSPITAL_BASED_OUTPATIENT_CLINIC_OR_DEPARTMENT_OTHER): Payer: Self-pay | Admitting: *Deleted

## 2019-01-05 ENCOUNTER — Emergency Department (HOSPITAL_BASED_OUTPATIENT_CLINIC_OR_DEPARTMENT_OTHER): Payer: Medicare Other

## 2019-01-05 ENCOUNTER — Other Ambulatory Visit: Payer: Self-pay

## 2019-01-05 DIAGNOSIS — Z87891 Personal history of nicotine dependence: Secondary | ICD-10-CM

## 2019-01-05 DIAGNOSIS — M810 Age-related osteoporosis without current pathological fracture: Secondary | ICD-10-CM | POA: Diagnosis present

## 2019-01-05 DIAGNOSIS — F32A Depression, unspecified: Secondary | ICD-10-CM | POA: Diagnosis present

## 2019-01-05 DIAGNOSIS — G43909 Migraine, unspecified, not intractable, without status migrainosus: Secondary | ICD-10-CM | POA: Diagnosis present

## 2019-01-05 DIAGNOSIS — M19041 Primary osteoarthritis, right hand: Secondary | ICD-10-CM | POA: Diagnosis not present

## 2019-01-05 DIAGNOSIS — Z419 Encounter for procedure for purposes other than remedying health state, unspecified: Secondary | ICD-10-CM

## 2019-01-05 DIAGNOSIS — R413 Other amnesia: Secondary | ICD-10-CM | POA: Diagnosis present

## 2019-01-05 DIAGNOSIS — X58XXXA Exposure to other specified factors, initial encounter: Secondary | ICD-10-CM | POA: Diagnosis present

## 2019-01-05 DIAGNOSIS — L940 Localized scleroderma [morphea]: Secondary | ICD-10-CM | POA: Diagnosis present

## 2019-01-05 DIAGNOSIS — Z8673 Personal history of transient ischemic attack (TIA), and cerebral infarction without residual deficits: Secondary | ICD-10-CM | POA: Diagnosis not present

## 2019-01-05 DIAGNOSIS — S72002A Fracture of unspecified part of neck of left femur, initial encounter for closed fracture: Secondary | ICD-10-CM | POA: Diagnosis present

## 2019-01-05 DIAGNOSIS — F329 Major depressive disorder, single episode, unspecified: Secondary | ICD-10-CM | POA: Diagnosis present

## 2019-01-05 DIAGNOSIS — I10 Essential (primary) hypertension: Secondary | ICD-10-CM | POA: Diagnosis present

## 2019-01-05 DIAGNOSIS — Z8249 Family history of ischemic heart disease and other diseases of the circulatory system: Secondary | ICD-10-CM | POA: Diagnosis not present

## 2019-01-05 DIAGNOSIS — Z79899 Other long term (current) drug therapy: Secondary | ICD-10-CM

## 2019-01-05 DIAGNOSIS — E871 Hypo-osmolality and hyponatremia: Secondary | ICD-10-CM | POA: Diagnosis present

## 2019-01-05 DIAGNOSIS — E861 Hypovolemia: Secondary | ICD-10-CM | POA: Diagnosis not present

## 2019-01-05 DIAGNOSIS — M19042 Primary osteoarthritis, left hand: Secondary | ICD-10-CM

## 2019-01-05 DIAGNOSIS — E785 Hyperlipidemia, unspecified: Secondary | ICD-10-CM | POA: Diagnosis present

## 2019-01-05 DIAGNOSIS — I7300 Raynaud's syndrome without gangrene: Secondary | ICD-10-CM | POA: Diagnosis present

## 2019-01-05 DIAGNOSIS — K219 Gastro-esophageal reflux disease without esophagitis: Secondary | ICD-10-CM | POA: Diagnosis present

## 2019-01-05 DIAGNOSIS — K589 Irritable bowel syndrome without diarrhea: Secondary | ICD-10-CM | POA: Diagnosis present

## 2019-01-05 DIAGNOSIS — F419 Anxiety disorder, unspecified: Secondary | ICD-10-CM | POA: Diagnosis present

## 2019-01-05 DIAGNOSIS — R059 Cough, unspecified: Secondary | ICD-10-CM

## 2019-01-05 DIAGNOSIS — D62 Acute posthemorrhagic anemia: Secondary | ICD-10-CM | POA: Diagnosis not present

## 2019-01-05 DIAGNOSIS — R05 Cough: Secondary | ICD-10-CM

## 2019-01-05 LAB — BASIC METABOLIC PANEL
Anion gap: 9 (ref 5–15)
BUN: 23 mg/dL (ref 8–23)
CALCIUM: 8.9 mg/dL (ref 8.9–10.3)
CO2: 22 mmol/L (ref 22–32)
Chloride: 98 mmol/L (ref 98–111)
Creatinine, Ser: 0.68 mg/dL (ref 0.44–1.00)
GFR calc Af Amer: >60 mL/min (ref >60)
GFR calc non Af Amer: >60 mL/min (ref >60)
Glucose, Bld: 143 mg/dL — ABNORMAL HIGH (ref 70–99)
Potassium: 3.9 mmol/L (ref 3.5–5.1)
Sodium: 129 mmol/L — ABNORMAL LOW (ref 135–145)

## 2019-01-05 LAB — CBC WITH DIFFERENTIAL/PLATELET
Abs Immature Granulocytes: 0.11 10*3/uL — ABNORMAL HIGH (ref 0.00–0.07)
Basophils Absolute: 0 10*3/uL (ref 0.0–0.1)
Basophils Relative: 0 %
EOS ABS: 0 10*3/uL (ref 0.0–0.5)
EOS PCT: 0 %
HCT: 37.3 % (ref 36.0–46.0)
Hemoglobin: 11.9 g/dL — ABNORMAL LOW (ref 12.0–15.0)
Immature Granulocytes: 1 %
Lymphocytes Relative: 6 %
Lymphs Abs: 0.6 10*3/uL — ABNORMAL LOW (ref 0.7–4.0)
MCH: 30.1 pg (ref 26.0–34.0)
MCHC: 31.9 g/dL (ref 30.0–36.0)
MCV: 94.4 fL (ref 80.0–100.0)
Monocytes Absolute: 0.3 10*3/uL (ref 0.1–1.0)
Monocytes Relative: 3 %
Neutro Abs: 8.8 10*3/uL — ABNORMAL HIGH (ref 1.7–7.7)
Neutrophils Relative %: 90 %
Platelets: 269 10*3/uL (ref 150–400)
RBC: 3.95 MIL/uL (ref 3.87–5.11)
RDW: 12.7 % (ref 11.5–15.5)
WBC: 9.8 10*3/uL (ref 4.0–10.5)
nRBC: 0 % (ref 0.0–0.2)

## 2019-01-05 MED ORDER — ACETAMINOPHEN 325 MG PO TABS: 650.0000 mg | ORAL_TABLET | Freq: Four times a day (QID) | ORAL | Status: DC | PRN

## 2019-01-05 MED ORDER — FENTANYL CITRATE (PF) 100 MCG/2ML IJ SOLN
100.0000 ug | Freq: Once | INTRAMUSCULAR | Status: AC
  Administered 2019-01-05: 100 ug via INTRAVENOUS
  Filled 2019-01-05: qty 2

## 2019-01-05 MED ORDER — ALPRAZOLAM 0.5 MG PO TABS: 0.5000 mg | ORAL_TABLET | Freq: Two times a day (BID) | ORAL | Status: DC | PRN

## 2019-01-05 MED ORDER — OXYCODONE-ACETAMINOPHEN 5-325 MG PO TABS: 1.0000 | ORAL_TABLET | ORAL | Status: DC | PRN

## 2019-01-05 MED ORDER — SENNOSIDES-DOCUSATE SODIUM 8.6-50 MG PO TABS: 1.0000 | ORAL_TABLET | Freq: Every evening | ORAL | Status: DC | PRN

## 2019-01-05 MED ORDER — MORPHINE SULFATE (PF) 2 MG/ML IV SOLN
2.0000 mg | INTRAVENOUS | Status: DC | PRN
  Administered 2019-01-06 (×2): 2 mg via INTRAVENOUS
  Filled 2019-01-05 (×2): qty 1

## 2019-01-05 MED ORDER — METHOCARBAMOL 500 MG PO TABS
500.0000 mg | ORAL_TABLET | Freq: Three times a day (TID) | ORAL | Status: DC | PRN
  Administered 2019-01-06: 500 mg via ORAL
  Filled 2019-01-05: qty 1

## 2019-01-05 MED ORDER — CALCIUM CARBONATE-VITAMIN D 500-200 MG-UNIT PO TABS: 1.0000 | ORAL_TABLET | Freq: Every day | ORAL | Status: DC

## 2019-01-05 MED ORDER — SODIUM CHLORIDE 0.9 % IV SOLN
INTRAVENOUS | Status: DC
  Administered 2019-01-06 – 2019-01-07 (×2): via INTRAVENOUS

## 2019-01-05 MED ORDER — PANTOPRAZOLE SODIUM 40 MG PO TBEC
40.0000 mg | DELAYED_RELEASE_TABLET | Freq: Every day | ORAL | Status: DC
  Administered 2019-01-07: 40 mg via ORAL
  Filled 2019-01-05: qty 1

## 2019-01-05 MED ORDER — VENLAFAXINE HCL 75 MG PO TABS
75.0000 mg | ORAL_TABLET | Freq: Every day | ORAL | Status: DC
  Administered 2019-01-07: 75 mg via ORAL
  Filled 2019-01-05 (×2): qty 1

## 2019-01-05 MED ORDER — FOLIC ACID 1 MG PO TABS: 1.0000 mg | ORAL_TABLET | Freq: Every day | ORAL | Status: DC

## 2019-01-05 MED ORDER — ONDANSETRON HCL 4 MG/2ML IJ SOLN: 4.0000 mg | Freq: Three times a day (TID) | INTRAMUSCULAR | Status: DC | PRN

## 2019-01-05 MED ORDER — CENTRUM PO CHEW
1.0000 | CHEWABLE_TABLET | Freq: Every day | ORAL | Status: DC
  Administered 2019-01-07: 1 via ORAL
  Filled 2019-01-05 (×2): qty 1

## 2019-01-05 MED ORDER — LORATADINE 10 MG PO TABS: 10.0000 mg | ORAL_TABLET | ORAL | Status: DC | PRN

## 2019-01-05 MED ORDER — VITAMIN D 25 MCG (1000 UNIT) PO TABS: 2000.0000 [IU] | ORAL_TABLET | Freq: Every day | ORAL | Status: DC

## 2019-01-05 MED ORDER — CALCIUM CARBONATE ANTACID 500 MG PO CHEW: 1.0000 | CHEWABLE_TABLET | ORAL | Status: DC | PRN

## 2019-01-05 MED ORDER — HYDRALAZINE HCL 20 MG/ML IJ SOLN: 5.0000 mg | INTRAMUSCULAR | Status: DC | PRN

## 2019-01-05 MED ORDER — FLUTICASONE PROPIONATE 50 MCG/ACT NA SUSP: 1.0000 | Freq: Every day | NASAL | Status: DC | PRN

## 2019-01-05 MED ORDER — FENTANYL CITRATE (PF) 100 MCG/2ML IJ SOLN
100.0000 ug | INTRAMUSCULAR | Status: DC | PRN
  Administered 2019-01-05 (×2): 100 ug via INTRAVENOUS
  Filled 2019-01-05 (×2): qty 2

## 2019-01-05 MED ORDER — ZOLPIDEM TARTRATE 5 MG PO TABS: 5.0000 mg | ORAL_TABLET | Freq: Every evening | ORAL | Status: DC | PRN

## 2019-01-05 MED ORDER — CELECOXIB 200 MG PO CAPS: 200.0000 mg | ORAL_CAPSULE | Freq: Every day | ORAL | Status: DC | PRN

## 2019-01-05 MED ORDER — MORPHINE SULFATE (PF) 4 MG/ML IV SOLN
4.0000 mg | Freq: Once | INTRAVENOUS | Status: AC
  Administered 2019-01-05: 4 mg via INTRAVENOUS
  Filled 2019-01-05: qty 1

## 2019-01-05 MED ORDER — AMLODIPINE BESYLATE 5 MG PO TABS
5.0000 mg | ORAL_TABLET | Freq: Every day | ORAL | Status: DC
  Administered 2019-01-07: 5 mg via ORAL
  Filled 2019-01-05: qty 1

## 2019-01-05 MED ORDER — SODIUM CHLORIDE 0.9 % IV BOLUS
500.0000 mL | Freq: Once | INTRAVENOUS | Status: AC
  Administered 2019-01-06: 500 mL via INTRAVENOUS

## 2019-01-05 NOTE — Consult Note (Signed)
Reason for Consult: Left femoral neck fracture Referring Physician: Hospitalist  Heather Lindsey is an 73 y.o. female.  HPI: The patient is a very independent 73 year old female with a long history of significant planes of pain in her left hip.  She recently started on a steroid Dosepak and was feeling a little bit better.  She was at a funeral today and stood up and fell.  She was unable to bear weight after that.  She went to [facility] was noted on x-ray to have a displaced femoral neck fracture.  We were consulted for evaluation and management.  She was transferred to [facility] long for treatment.  She is to be admitted and cleared via the internal medicine service.  Once that is accomplished we will proceed with appropriate fixation.  The patient states that she is extremely independent she is up and about every day she is walking and driving.  Past Medical History:  Diagnosis Date  . Anxiety   . 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 Brain bleed ([place])   . Circumscribed scleroderma   . Depression   . Diverticulosis   . GERD (gastroesophageal reflux disease)   . Hyperlipidemia   . IBS (irritable bowel syndrome)   . Memory loss   . Migraines   . Osteoarthritis   . Osteoporosis   . Stroke [facility]) 2015    Past Surgical History:  Procedure Laterality Date  . broken ankle Left   . TUBAL LIGATION      Family History  Problem Relation Age of Onset  . Heart disease Father   . Liver cancer Paternal Aunt   . Prostate cancer Paternal Grandfather   . Colon cancer Neg Hx   . Stomach cancer Neg Hx     Social History:  reports that she has quit smoking. She has never used smokeless tobacco. She reports current alcohol use. She reports that she does not use drugs.  Allergies: No Known Allergies  Medications: I have reviewed the patient's current medications.  Results for orders placed or performed during the hospital encounter of 01/05/19 (from the past  48 hour(s))  Basic metabolic panel     Status: Abnormal   Collection Time: 01/05/19  7:03 PM  Result Value Ref Range   Sodium 129 (L) 135 - 145 mmol/L   Potassium 3.9 3.5 - 5.1 mmol/L   Chloride 98 98 - 111 mmol/L   CO2 22 22 - 32 mmol/L   Glucose, Bld 143 (H) 70 - 99 mg/dL   BUN 23 8 - 23 mg/dL   Creatinine, Ser 0.68 0.44 - 1.00 mg/dL   Calcium 8.9 8.9 - 10.3 mg/dL   GFR calc non Af Amer >60 >60 mL/min   GFR calc Af Amer >60 >60 mL/min   Anion gap 9 5 - 15    Comment: Performed at [facility], [address] [place], [place] [address]  CBC with Differential     Status: Abnormal   Collection Time: 01/05/19  7:03 PM  Result Value Ref Range   WBC 9.8 4.0 - 10.5 K/uL   RBC 3.95 3.87 - 5.11 MIL/uL   Hemoglobin 11.9 (L) 12.0 - 15.0 g/dL   HCT 37.3 36.0 - 46.0 %   MCV 94.4 80.0 - 100.0 fL   MCH 30.1 26.0 - 34.0 pg   MCHC 31.9 30.0 - 36.0 g/dL   RDW 12.7 11.5 - 15.5 %   Platelets 269 150 -  400 K/uL   nRBC 0.0 0.0 - 0.2 %   Neutrophils Relative % 90 %   Neutro Abs 8.8 (H) 1.7 - 7.7 K/uL   Lymphocytes Relative 6 %   Lymphs Abs 0.6 (L) 0.7 - 4.0 K/uL   Monocytes Relative 3 %   Monocytes Absolute 0.3 0.1 - 1.0 K/uL   Eosinophils Relative 0 %   Eosinophils Absolute 0.0 0.0 - 0.5 K/uL   Basophils Relative 0 %   Basophils Absolute 0.0 0.0 - 0.1 K/uL   Immature Granulocytes 1 %   Abs Immature Granulocytes 0.11 (H) 0.00 - 0.07 K/uL    Comment: Performed at [facility], [address] [place], [place] [address]    Dg Lumbar Spine Complete  Result Date: 01/05/2019 CLINICAL DATA:  Low back pain for 2 days, no known injury, initial encounter EXAM: LUMBAR SPINE - COMPLETE 4+ VIEW COMPARISON:  None. FINDINGS: Five lumbar type vertebral bodies are well visualized. Vertebral body height is well maintained. Facet hypertrophic changes are seen. Degenerative anterolisthesis of L5 on S1 is noted likely of a chronic nature. No other focal abnormality is seen. IMPRESSION:  Degenerative change with anterolisthesis of L5 on S1. Electronically Signed   By: Inez Catalina M.D.   On: 01/05/2019 18:55   Dg Hip Unilat W Or Wo Pelvis 2-3 Views Left  Result Date: 01/05/2019 CLINICAL DATA:  Left hip pain, no known injury EXAM: DG HIP (WITH OR WITHOUT PELVIS) 3V LEFT COMPARISON:  None. FINDINGS: There is an oblique fracture through the left femoral neck at the junction of the femoral neck and intratrochanteric region. Impaction and angulation is noted at the fracture site. No other fractures are seen. IMPRESSION: Left femoral neck fracture inferiorly Electronically Signed   By: Inez Catalina M.D.   On: 01/05/2019 18:56    ROS  ROS: I have reviewed the patient's review of systems thoroughly and there are no positive responses as relates to the HPI. Blood pressure 136/79, pulse 75, temperature 98.4 F (36.9 C), temperature source Oral, resp. rate 18, height 5\' 4"  (1.626 m), weight 65.3 kg, SpO2 98 %. Physical Exam Well-developed well-nourished patient in no acute distress. Alert and oriented x3 HEENT:within normal limits Cardiac: Regular rate and rhythm Pulmonary: Lungs clear to auscultation Abdomen: Soft and nontender.  Normal active bowel sounds  Musculoskeletal: (Left hip: Limited range of motion.  Painful range of motion.  Obvious malalignment and deformity of left lower extremity. Assessment/Plan: 73year-old extremely healthy woman who has fallen and has a left femoral neck fracture.//We had a long discussion of treatment options but I think given the fact that she is extremely healthy and independent and clearly has a time arise no greater than 10 years that total hip replacement is the appropriate course of action.  She certainly understands the risks of bleeding, infection, dislocation, need for further surgery, and the slight risk of death and and around the time of surgery and she wishes to proceed. [name] 01/05/2019, 11:33 PM

## 2019-01-05 NOTE — ED Triage Notes (Signed)
Pt reports left left/hip pain since Thursday, woke with pain, denies injury. Saw her her PCP and was started on prednisone taper and is using tramadol and baclofen (old Rx). States improvement after first day of steroids. Yesterday pain worsened. Today she is having difficulty walking and states she almost fell after leaving funeral where she had been sitting for an extended period. She had an xray on Thursday of her left hip.

## 2019-01-05 NOTE — ED Notes (Signed)
Pt c/o pain in lobby, pt husband requesting to go home and retreive home pain meds. Pt provided heat pack.

## 2019-01-05 NOTE — Progress Notes (Signed)
Patient ID: Heather Lindsey, female   DOB: October 12, 1946, 73 y.o.   MRN: [id number] I was consult was patient has a displaced femoral neck fracture on the left side.  She will be transferred to [place] [facility] for evaluation and admission the of the hospitalist service.   If they find her medical wrist acceptable, I will plan on surgical intervention for her left femoral neck fracture tomorrow at 7:30 AM.  I have written orders for her to be n.p.o. after midnight.  I will come meet the patient later tonight when she's arrived at was to the hospital to discuss the procedure and planned intervention.

## 2019-01-05 NOTE — H&P (Signed)
History and Physical    Heather Lindsey WNI:[id number] DOB: 07-Feb-1946 DOA: 01/05/2019  Referring MD/NP/PA:   PCP: Prince Solian, MD   Patient coming from:  The patient is coming from home.  At baseline, pt is independent for most of ADL.        Chief Complaint: left hip pain  HPI: Heather Lindsey is a 73 y.o. female with medical history significant of osteoporosis, Raynaud's syndrome, hyperlipidemia, stroke, GERD, depression, anxiety, intracranial bleeding, migraine headaches, IBS, memory loss, arthritis, who presents with left hip pain.  Patient has been having left hip pain for about 4 days, which has progressively worsening. No injury.   The pain is constant, 7-10 out of 10 in severity, sharp, radiating to foot posteriorly.  No leg weakness or numbness. Patient was sent by PCP, and given prescription for prednisone, tramadol, baclofen, but still has pain.  Patient states that taking tramadol makes her nauseated, and she vomited several times.  She has cold-like symptoms 2 weeks ago, which has improved, but still has cough with greenish colored mucus production.  Denies shortness of breath, chest pain, fever or chills.  Patient does not have diarrhea or abdominal pain.  No symptoms of UTI.  No unilateral weakness.  ED Course: pt was found to have Patient was found to have WBC 9.8, sodium 129, creatinine and BUN normal, temperature normal, no tachycardia, oxygen saturation 97% on room air. X-ray showed left displaced femoral neck fracture. Patient is admitted to [facility] bed as inpatient. Orthopedic surgeon, Dr. Berenice Primas is planning to do surgery at 7:30 AM.  Review of Systems:   General: no fevers, chills, no body weight gain, has fatigue HEENT: no blurry vision, hearing changes or sore throat Respiratory: no dyspnea, has coughing, no wheezing CV: no chest pain, no palpitations GI: has nausea, vomiting, no abdominal pain, diarrhea, constipation GU: no dysuria, burning on urination, increased  urinary frequency, hematuria  Ext: no leg edema Neuro: no unilateral weakness, numbness, or tingling, no vision change or hearing loss Skin: no rash, no skin tear. MSK: No muscle spasm, no deformity, no limitation of range of movement in spin. Has left hip pain Heme: No easy bruising.  Travel history: No recent long distant travel.  Allergy: No Known Allergies  Past Medical History:  Diagnosis Date  . Anxiety   . 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 Brain bleed ([place])   . Circumscribed scleroderma   . Depression   . Diverticulosis   . GERD (gastroesophageal reflux disease)   . Hyperlipidemia   . IBS (irritable bowel syndrome)   . Memory loss   . Migraines   . Osteoarthritis   . Osteoporosis   . Stroke [facility]) 2015    Past Surgical History:  Procedure Laterality Date  . broken ankle Left   . TUBAL LIGATION      Social History:  reports that she has quit smoking. She has never used smokeless tobacco. She reports current alcohol use. She reports that she does not use drugs.  Family History:  Family History  Problem Relation Age of Onset  . Heart disease Father   . Liver cancer Paternal Aunt   . Prostate cancer Paternal Grandfather   . Colon cancer Neg Hx   . Stomach cancer Neg Hx      Prior to Admission medications   Medication Sig Start Date End Date Taking? Authorizing Provider  acetaminophen (TYLENOL) 500 MG tablet Take 500 mg by mouth every 6 (six)  hours as needed.      [provider]  ALPRAZolam Duanne Moron) 0.5 MG tablet Take 0.5 mg by mouth as needed.  06/05/15   [provider]  amLODipine (NORVASC) 5 MG tablet Once a day po, watch for puffy feet ! 11/12/14   Dohmeier, Asencion Partridge, MD  baclofen (LIORESAL) 10 MG tablet TAKE 1 TABLET BY MOUTH AT  8AM AND 2PM AS NEEDED 07/10/18   Bo Merino, MD  calcium carbonate (TUMS - DOSED IN MG ELEMENTAL CALCIUM) 500 MG chewable tablet Chew 1 tablet by mouth as needed.      [provider]  Calcium Citrate-Vitamin D 500-250 MG-UNIT PACK Take 1 capsule by mouth daily.      [provider]  celecoxib (CELEBREX) 200 MG capsule TAKE 1 CAPSULE BY MOUTH  DAILY AS NEEDED 11/19/18   Bo Merino, MD  Cholecalciferol (VITAMIN D) 2000 UNITS CAPS Take 1 capsule by mouth. 3 time a week     [provider]  fluticasone (FLONASE) 50 MCG/ACT nasal spray Place 1 spray into both nostrils daily as needed for allergies or rhinitis.    [provider]  folic acid (FOLVITE) 1 MG tablet Take 1 mg by mouth daily.    [provider]  Folic Acid-Vit O1-HYQ M57 (FOLGARD PO) Take 1 capsule by mouth daily.      [provider]  hyoscyamine (LEVSIN) 0.125 MG tablet Take 0.125 mg by mouth every 4 (four) hours as needed.    [provider]  loratadine (CLARITIN) 10 MG tablet Take 10 mg by mouth as needed.      [provider]  methotrexate (RHEUMATREX) 10 MG tablet Take 10 mg by mouth once a week. Caution: Chemotherapy. Protect from light.    [provider]  Multiple Vitamins-Minerals (CENTRUM PO) Take 1 capsule by mouth daily.      [provider]  pantoprazole (PROTONIX) 40 MG tablet Take 1 tablet (40 mg total) by mouth daily. 09/04/18   Doran Stabler, MD  traMADol (ULTRAM) 50 MG tablet Take 100 mg as needed by mouth.     [provider]  venlafaxine (EFFEXOR) 75 MG tablet Take 75 mg by mouth daily.  05/04/17   [provider]  Zoledronic Acid (RECLAST IV) Inject into the vein.    [provider]    Physical Exam: Vitals:   01/05/19 1852 01/05/19 2100 01/05/19 2225 01/05/19 2308  BP: (!) 161/94 133/76 134/76 136/79  Pulse:   84 75  Resp: 18 15 18 18   Temp:    98.4 F (36.9 C)  TempSrc:    Oral  SpO2: 97%  98% 98%  Weight:      Height:       General: Not in acute distress HEENT:       Eyes: PERRL, EOMI, no scleral icterus.       ENT: No discharge from the ears and nose,  no pharynx injection, no tonsillar enlargement.        Neck: No JVD, no bruit, no mass felt. Heme: No neck lymph node enlargement. Cardiac: S1/S2, RRR, No murmurs, No gallops or rubs. Respiratory: No rales, wheezing, rhonchi or rubs. GI: Soft, nondistended, nontender, no rebound pain, no organomegaly, BS present. GU: No hematuria Ext: No pitting leg edema bilaterally. 2+DP/PT pulse bilaterally. Musculoskeletal: has tenderness in left hip, the left leg is externally rotated slightly. Skin: has morphea Neuro: Alert, oriented X3, cranial nerves II-XII grossly intact, moves all extremities. Psych:  Patient is not psychotic, no suicidal or hemocidal ideation.  Labs on Admission: I have personally reviewed following labs and imaging studies  CBC: Recent Labs  Lab 01/05/19 1903  WBC 9.8  NEUTROABS 8.8*  HGB 11.9*  HCT 37.3  MCV 94.4  PLT 782   Basic Metabolic Panel: Recent Labs  Lab 01/05/19 1903  NA 129*  K 3.9  CL 98  CO2 22  GLUCOSE 143*  BUN 23  CREATININE 0.68  CALCIUM 8.9   GFR: Estimated Creatinine Clearance: 55.7 mL/min (by C-G formula based on SCr of 0.68 mg/dL). Liver Function Tests: No results for input(s): AST, ALT, ALKPHOS, BILITOT, PROT, ALBUMIN in the last 168 hours. No results for input(s): LIPASE, AMYLASE in the last 168 hours. No results for input(s): AMMONIA in the last 168 hours. Coagulation Profile: Recent Labs  Lab 01/06/19 0011  INR 0.95   Cardiac Enzymes: No results for input(s): CKTOTAL, CKMB, CKMBINDEX, TROPONINI in the last 168 hours. BNP (last 3 results) No results for input(s): PROBNP in the last 8760 hours. HbA1C: No results for input(s): HGBA1C in the last 72 hours. CBG: No results for input(s): GLUCAP in the last 168 hours. Lipid Profile: No results for input(s): CHOL, HDL, LDLCALC, TRIG, CHOLHDL, LDLDIRECT in the last 72 hours. Thyroid Function Tests: No results for input(s): TSH, T4TOTAL, FREET4, T3FREE, THYROIDAB in the last 72  hours. Anemia Panel: No results for input(s): VITAMINB12, FOLATE, FERRITIN, TIBC, IRON, RETICCTPCT in the last 72 hours. Urine analysis: No results found for: COLORURINE, APPEARANCEUR, LABSPEC, PHURINE, GLUCOSEU, HGBUR, BILIRUBINUR, KETONESUR, PROTEINUR, UROBILINOGEN, NITRITE, LEUKOCYTESUR Sepsis Labs: @LABRCNTIP (procalcitonin:4,lacticidven:4) )No results found for this or any previous visit (from the past 240 hour(s)).   Radiological Exams on Admission: Dg Lumbar Spine Complete  Result Date: 01/05/2019 CLINICAL DATA:  Low back pain for 2 days, no known injury, initial encounter EXAM: LUMBAR SPINE - COMPLETE 4+ VIEW COMPARISON:  None. FINDINGS: Five lumbar type vertebral bodies are well visualized. Vertebral body height is well maintained. Facet hypertrophic changes are seen. Degenerative anterolisthesis of L5 on S1 is noted likely of a chronic nature. No other focal abnormality is seen. IMPRESSION: Degenerative change with anterolisthesis of L5 on S1. Electronically Signed   By: Inez Catalina M.D.   On: 01/05/2019 18:55   Dg Chest Port 1 View  Result Date: 01/06/2019 CLINICAL DATA:  Cough. Preop. EXAM: PORTABLE CHEST 1 VIEW COMPARISON:  None. FINDINGS: Low lung volumes. Mild patient rotation. Streaky bibasilar atelectasis. Heart is normal in size. Normal mediastinal contours for degree of rotation. No pneumothorax or confluent airspace disease. No pulmonary edema. No large pleural effusion. No acute osseous abnormalities are seen. IMPRESSION: Low lung volumes with streaky bibasilar atelectasis. Electronically Signed   By: Keith Rake M.D.   On: 01/06/2019 00:46   Dg Hip Unilat W Or Wo Pelvis 2-3 Views Left  Result Date: 01/05/2019 CLINICAL DATA:  Left hip pain, no known injury EXAM: DG HIP (WITH OR WITHOUT PELVIS) 3V LEFT COMPARISON:  None. FINDINGS: There is an oblique fracture through the left femoral neck at the junction of the femoral neck and intratrochanteric region. Impaction and  angulation is noted at the fracture site. No other fractures are seen. IMPRESSION: Left femoral neck fracture inferiorly Electronically Signed   By: Inez Catalina M.D.   On: 01/05/2019 18:56     EKG: Independently reviewed.  Sinus rhythm, QTC 419, early R wave progression, nonspecific T wave change.  Assessment/Plan Principal Problem:   Left displaced femoral neck  fracture [facility]) Active Problems:   Depression   Morphea   Raynaud's phenomenon without gangrene   Gastroesophageal reflux disease    Anxiety   Hyponatremia   Cough   Left displaced femoral neck fracture ([place]): As evidenced by x-ray. Patient has moderate pain now. No neurovascular compromise. Orthopedic surgeon, Dr. Berenice Primas will do surgery in AM.  - will admit to Med-surg bed as inpt - Pain control: morphine prn and percocet - When necessary Zofran for nausea - Robaxin for muscle spasm - type and cross - INR/PTT - PT/OT when able to (not ordered now)  Depression and anxiety: Stable, no suicidal or homicidal ideations. -Continue home medications: Xanax and Effexor  Morphea: Patient is following up with dermatologist.  Currently taking methotrexate. 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  Unclear etiology. - Will check urine sodium, urine osmolality, serum osmolality. - check TSH - Fluid restriction - IVF: 500L NS, will continue with IV normal saline at 75 mL/h - f/u by BMP  Cough: No fever or chills.  No chest pain or shortness of breath.  May be due to mild upper respiratory viral infection. -Follow-up chest x-ray--> CXR came back negative -PRN Mucinex for cough   Perioperative Cardiac Risk: He has multiple comorbidities, including osteoporosis, morphea, Raynaud's syndrome, hyperlipidemia, stroke, GERD, depression, anxiety, intracranial bleeding  2015, migraine headaches, IBS, memory loss, arthritis, who presents with left hip fracture. Currently patient is active and independent of his ADLs, IADLs. No significant CAD or CHF.  EKG has no acute change. At this time point, no further work up is needed. His GUPTA score perioperative myocardial infarction or cardaic arrest is 0.18 %, which is low risk. Pt has hyponatremia with sodium 129, which is expected to be corrected at least partially in morning.   Inpatient status:  # Patient requires inpatient status due to high intensity of service, high risk for further deterioration and high frequency of surveillance required.  I certify that at the point of admission it is my clinical judgment that the patient will require inpatient hospital care spanning beyond 2 midnights from the point of admission.  . This patient has multiple chronic comorbidities including osteoporosis,  Raynaud's syndrome, hyperlipidemia, stroke, GERD, depression, anxiety, intracranial bleeding, migraine headaches, IBS, memory loss, arthritis. . Now patient has presenting symptoms include left hip pain due to left femoral neck fracture . The worrisome physical exam findings include left hip tenderness . The initial radiographic and laboratory data are worrisome because of hyponatremia, displaced left femoral neck fracture. . Current medical needs: please see my assessment and plan . Predictability of an adverse outcome (risk): Patient has multiple comorbidities, now presents with left femoral neck fracture, needs surgery fixation.  Patient will need to stay in hospital for at least 2 days.       DVT ppx: SCD Code Status: Full code Family Communication:  Yes, patient's husband at bed side Disposition Plan:  Anticipate discharge back to previous home environment Consults called:  Dr. Berenice Primas or ortho Admission status:    medical floor/inpt   Date of Service 01/06/2019    [facility]   If 7PM-7AM,  please contact night-coverage [email or web address] Password [facility] 01/06/2019, 3:29 AM

## 2019-01-05 NOTE — Care Management (Addendum)
This is a no charge note  Transfer from [facility] per Dr.  Regenia Skeeter  73 year old lady with past medical history of osteoporosis, hyperlipidemia, morphea, stroke, GERD, depression, anxiety, intracranial bleeding, migraine headaches, IBS, memory loss, arthritis, who presents with left hip pain.  Patient has been having left hip pain for several days, which has progressively worsening. No injury. Patient was sent by PCP, and given prescription for prednisone, tramadol, baclofen, but still has pain.  Patient was found to have left displaced femoral neck fracture.  Orthopedic surgeon, Dr. Berenice Primas is planning to do surgery at 7:30 AM.  Keep patient n.p.o. after midnight.  Patient was found to have WBC 9.8, sodium 129, creatinine and BUN normal, temperature normal, no tachycardia, oxygen saturation 97% on room air.  Patient is admitted to [facility] bed as inpatient.  Please call manager of [facility] at [phone] when pt arrives to floor   Ivor Costa, MD  [facility]   If 7PM-7AM, please contact night-coverage [email or web address] Password [facility] 01/05/2019, 8:51 PM

## 2019-01-05 NOTE — ED Notes (Signed)
[facility] here for transport. Pain medication given.

## 2019-01-05 NOTE — ED Notes (Signed)
ED Provider at bedside. 

## 2019-01-05 NOTE — ED Notes (Signed)
No deformity noted. Pt states took 15 Mg of Oral Morphine at 1600 today. MD informed

## 2019-01-05 NOTE — ED Provider Notes (Signed)
[place] EMERGENCY DEPARTMENT Provider Note   CSN: [id number] Arrival date & time: 01/05/19  1611     History   Chief Complaint Chief Complaint  Patient presents with  . Leg Pain    HPI Heather Lindsey is a 73 y.o. female.  HPI  73 year old female presents with severe left leg pain.  Started about 2 days ago.  Saw her PCP and was started on prednisone.  The next day, yesterday, she was feeling better and much more ambulatory.  She had reportedly had hip x-rays that were benign.  However today the pain is much worse to the point it is very difficult to ambulate.  The pain seems to start in her low back/buttocks and radiates down her leg a little bit past her knee.  There is no numbness.  She states her leg feels weak but it is more pain related than true weakness.  There is no new incontinence and no fevers.  No direct trauma.  She has been taking extra strength Tylenol.  She took a leftover morphine immediate release tablet that was 73 years old about 15 minutes prior to arrival.  Past Medical History:  Diagnosis Date  . Anxiety   . 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 Brain bleed ([place])   . Circumscribed scleroderma   . Depression   . Diverticulosis   . GERD (gastroesophageal reflux disease)   . Hyperlipidemia   . IBS (irritable bowel syndrome)   . Memory loss   . Migraines   . Osteoarthritis   . Osteoporosis   . Stroke [facility]) 2015    Patient Active Problem List   Diagnosis Date Noted  . HTN (hypertension) 01/05/2019  . Anxiety 01/05/2019  . Left displaced femoral neck fracture ([place]) 01/05/2019  . Hyponatremia 01/05/2019  . Raynaud's phenomenon without gangrene 10/25/2016  . Trochanteric bursitis of both hips 10/25/2016  . Gastroesophageal reflux disease  10/25/2016  . Former smoker, 10/25/2016  . 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 HYPERCHOLESTEROLEMIA 11/18/2008  . Depression 11/18/2008  . IRRITABLE BOWEL SYNDROME 11/18/2008  . Morphea 11/18/2008  . Primary osteoarthritis of both hands 11/18/2008  . Age related osteoporosis 11/18/2008  . MIGRAINES, HX OF 11/18/2008    Past Surgical History:  Procedure Laterality Date  . broken ankle Left   . TUBAL LIGATION       OB History   No obstetric history on file.      Home Medications    Prior to Admission medications   Medication Sig Start Date End Date Taking? Authorizing Provider  acetaminophen (TYLENOL) 500 MG tablet Take 500 mg by mouth every 6 (six) hours as needed.      [provider]  ALPRAZolam Duanne Moron) 0.5 MG tablet Take 0.5 mg by mouth as needed.  06/05/15   [provider]  amLODipine (NORVASC) 5 MG tablet Once a day po, watch for puffy feet ! 11/12/14   Dohmeier, Asencion Partridge, MD  baclofen (LIORESAL) 10 MG tablet TAKE 1 TABLET BY MOUTH AT  8AM AND 2PM AS NEEDED 07/10/18   Bo Merino, MD  calcium carbonate (TUMS - DOSED IN MG ELEMENTAL CALCIUM) 500 MG chewable tablet Chew 1 tablet by mouth as needed.      [provider]  Calcium Citrate-Vitamin D 500-250 MG-UNIT PACK Take 1 capsule by mouth daily.      [provider]  celecoxib (CELEBREX) 200 MG capsule TAKE 1 CAPSULE BY MOUTH  DAILY AS NEEDED 11/19/18   Bo Merino, MD  Cholecalciferol (VITAMIN D) 2000 UNITS CAPS Take 1 capsule by mouth. 3 time a week     [provider]  fluticasone (FLONASE) 50 MCG/ACT nasal spray Place 1 spray into both nostrils daily as needed for allergies or rhinitis.    [provider]  folic acid (FOLVITE) 1 MG tablet Take 1 mg by mouth daily.    [provider]  Folic Acid-Vit J1-OAC Z66 (FOLGARD PO) Take 1 capsule by mouth daily.      [provider]  hyoscyamine (LEVSIN) 0.125 MG tablet Take 0.125 mg by mouth every 4 (four) hours as needed.    [provider]    loratadine (CLARITIN) 10 MG tablet Take 10 mg by mouth as needed.      [provider]  methotrexate (RHEUMATREX) 10 MG tablet Take 10 mg by mouth once a week. Caution: Chemotherapy. Protect from light.    [provider]  Multiple Vitamins-Minerals (CENTRUM PO) Take 1 capsule by mouth daily.      [provider]  pantoprazole (PROTONIX) 40 MG tablet Take 1 tablet (40 mg total) by mouth daily. 09/04/18   Doran Stabler, MD  traMADol (ULTRAM) 50 MG tablet Take 100 mg as needed by mouth.     [provider]  venlafaxine (EFFEXOR) 75 MG tablet Take 75 mg by mouth daily.  05/04/17   [provider]  Zoledronic Acid (RECLAST IV) Inject into the vein.    [provider]    Family History Family History  Problem Relation Age of Onset  . Heart disease Father   . Liver cancer Paternal Aunt   . Prostate cancer Paternal Grandfather   . Colon cancer Neg Hx   . Stomach cancer Neg Hx     Social History Social History   Tobacco Use  . Smoking status: Former Research scientist (life sciences)  . Smokeless tobacco: Never Used  . Tobacco comment: Quit now 40 years   Substance Use Topics  . Alcohol use: Yes    Comment: glass of wine daily   . Drug use: No     Allergies   Patient has no known allergies.   Review of Systems Review of Systems  Constitutional: Negative for fever.  Gastrointestinal: Negative for abdominal pain.  Genitourinary:       No incontinence  Musculoskeletal: Positive for back pain.  Neurological: Negative for weakness and numbness.  All other systems reviewed and are negative.    Physical Exam Updated Vital Signs BP 133/76   Pulse 85   Temp 98.2 F (36.8 C) (Oral)   Resp 15   Ht 5\' 4"  (1.626 m)   Wt 65.3 kg   SpO2 97%   BMI 24.72 kg/m   Physical Exam Vitals signs and nursing note reviewed.  Constitutional:      Appearance: She is well-developed. She is not ill-appearing or diaphoretic.  HENT:     Head: Normocephalic and  atraumatic.     Right Ear: External ear normal.     Left Ear: External ear normal.     Nose: Nose normal.  Eyes:     General:        Right eye: No discharge.        Left eye: No discharge.  Cardiovascular:     Rate and Rhythm: Normal rate and regular rhythm.     Pulses:  Dorsalis pedis pulses are 2+ on the left side.     Heart sounds: Normal heart sounds.  Pulmonary:     Effort: Pulmonary effort is normal.     Breath sounds: Normal breath sounds.  Abdominal:     General: There is no distension.     Palpations: Abdomen is soft.     Tenderness: There is no abdominal tenderness.  Musculoskeletal:     Left hip: She exhibits decreased range of motion and tenderness.     Lumbar back: She exhibits tenderness.  Skin:    General: Skin is warm and dry.  Neurological:     Mental Status: She is alert.     Deep Tendon Reflexes:     Reflex Scores:      Patellar reflexes are 2+ on the right side and 2+ on the left side.      Achilles reflexes are 2+ on the right side and 2+ on the left side.    Comments: 5/5 strength in RLE. Normal gross sensation bilaterally. LLE strength testing is limited by severe pain.  Psychiatric:        Mood and Affect: Mood is not anxious.      ED Treatments / Results  Labs (all labs ordered are listed, but only abnormal results are displayed) Labs Reviewed  BASIC METABOLIC PANEL - Abnormal; Notable for the following components:      Result Value   Sodium 129 (*)    Glucose, Bld 143 (*)    All other components within normal limits  CBC WITH DIFFERENTIAL/PLATELET - Abnormal; Notable for the following components:   Hemoglobin 11.9 (*)    Neutro Abs 8.8 (*)    Lymphs Abs 0.6 (*)    Abs Immature Granulocytes 0.11 (*)    All other components within normal limits    EKG EKG Interpretation  Date/Time:  Saturday January 05 2019 19:10:09 EST Ventricular Rate:  79 PR Interval:    QRS Duration: 91 QT Interval:  365 QTC Calculation: 419 R  Axis:   69 Text Interpretation:  Sinus rhythm Baseline wander in lead(s) V1 no acute ST/T changes No old tracing to compare Confirmed by Sherwood Gambler [phone]) on 01/05/2019 7:14:02 PM   Radiology Dg Lumbar Spine Complete  Result Date: 01/05/2019 CLINICAL DATA:  Low back pain for 2 days, no known injury, initial encounter EXAM: LUMBAR SPINE - COMPLETE 4+ VIEW COMPARISON:  None. FINDINGS: Five lumbar type vertebral bodies are well visualized. Vertebral body height is well maintained. Facet hypertrophic changes are seen. Degenerative anterolisthesis of L5 on S1 is noted likely of a chronic nature. No other focal abnormality is seen. IMPRESSION: Degenerative change with anterolisthesis of L5 on S1. Electronically Signed   By: Inez Catalina M.D.   On: 01/05/2019 18:55   Dg Hip Unilat W Or Wo Pelvis 2-3 Views Left  Result Date: 01/05/2019 CLINICAL DATA:  Left hip pain, no known injury EXAM: DG HIP (WITH OR WITHOUT PELVIS) 3V LEFT COMPARISON:  None. FINDINGS: There is an oblique fracture through the left femoral neck at the junction of the femoral neck and intratrochanteric region. Impaction and angulation is noted at the fracture site. No other fractures are seen. IMPRESSION: Left femoral neck fracture inferiorly Electronically Signed   By: Inez Catalina M.D.   On: 01/05/2019 18:56    Procedures Procedures (including critical care time)  Medications Ordered in ED Medications  fentaNYL (SUBLIMAZE) injection 100 mcg (100 mcg Intravenous Given 01/05/19 1950)  oxyCODONE-acetaminophen (PERCOCET/ROXICET) 5-325 MG  per tablet 1 tablet (has no administration in time range)  morphine 2 MG/ML injection 2 mg (has no administration in time range)  methocarbamol (ROBAXIN) tablet 500 mg (has no administration in time range)  morphine 4 MG/ML injection 4 mg (4 mg Intravenous Given 01/05/19 1814)  fentaNYL (SUBLIMAZE) injection 100 mcg (100 mcg Intravenous Given 01/05/19 1838)     Initial Impression / Assessment  and Plan / ED Course  I have reviewed the triage vital signs and the nursing notes.  Pertinent labs & imaging results that were available during my care of the patient were reviewed by me and considered in my medical decision making (see chart for details).     Patient's x-ray shows femoral neck fracture.  She is neurovascularly intact.  Initial dose of morphine did not seem to significantly help but now she is better after IV fentanyl.  Discussed with Dr. Berenice Primas who will consult on patient tonight and take to the OR tomorrow at 7:30 AM.  Discussed with Dr. Blaine Hamper who will accept in transfer and admission to [facility].  Final Clinical Impressions(s) / ED Diagnoses   Final diagnoses:  Left displaced femoral neck fracture [facility])    ED Discharge Orders    None       Sherwood Gambler, MD 01/05/19 2145

## 2019-01-05 NOTE — ED Notes (Signed)
Patient transported to X-ray 

## 2019-01-06 ENCOUNTER — Inpatient Hospital Stay (HOSPITAL_COMMUNITY): Payer: Medicare Other | Admitting: Anesthesiology

## 2019-01-06 ENCOUNTER — Inpatient Hospital Stay (HOSPITAL_COMMUNITY): Payer: Medicare Other

## 2019-01-06 ENCOUNTER — Encounter (HOSPITAL_COMMUNITY)
Admission: EM | Disposition: A | Discharge status: Home Health | Payer: Self-pay | Source: Home / Self Care | Attending: Internal Medicine

## 2019-01-06 ENCOUNTER — Encounter (HOSPITAL_COMMUNITY): Payer: Self-pay | Admitting: Anesthesiology

## 2019-01-06 DIAGNOSIS — E871 Hypo-osmolality and hyponatremia: Secondary | ICD-10-CM

## 2019-01-06 DIAGNOSIS — F329 Major depressive disorder, single episode, unspecified: Secondary | ICD-10-CM

## 2019-01-06 DIAGNOSIS — R059 Cough, unspecified: Secondary | ICD-10-CM | POA: Diagnosis present

## 2019-01-06 DIAGNOSIS — I7300 Raynaud's syndrome without gangrene: Secondary | ICD-10-CM

## 2019-01-06 DIAGNOSIS — S72002A Fracture of unspecified part of neck of left femur, initial encounter for closed fracture: Principal | ICD-10-CM

## 2019-01-06 DIAGNOSIS — R05 Cough: Secondary | ICD-10-CM | POA: Diagnosis present

## 2019-01-06 HISTORY — PX: TOTAL HIP ARTHROPLASTY: SHX124

## 2019-01-06 LAB — OSMOLALITY: Osmolality: 280 mOsm/kg (ref 275–295)

## 2019-01-06 LAB — BASIC METABOLIC PANEL
Anion gap: 7 (ref 5–15)
BUN: 16 mg/dL (ref 8–23)
CHLORIDE: 107 mmol/L (ref 98–111)
CO2: 25 mmol/L (ref 22–32)
Calcium: 8.5 mg/dL — ABNORMAL LOW (ref 8.9–10.3)
Creatinine, Ser: 0.6 mg/dL (ref 0.44–1.00)
GFR calc Af Amer: >60 mL/min (ref >60)
GFR calc non Af Amer: >60 mL/min (ref >60)
Glucose, Bld: 93 mg/dL (ref 70–99)
Potassium: 3.8 mmol/L (ref 3.5–5.1)
Sodium: 139 mmol/L (ref 135–145)

## 2019-01-06 LAB — TYPE AND SCREEN
ABO/RH(D): O NEG
Antibody Screen: NEGATIVE

## 2019-01-06 LAB — PROTIME-INR
INR: 0.95
Prothrombin Time: 12.6 seconds (ref 11.4–15.2)

## 2019-01-06 LAB — APTT: APTT: 26 s (ref 24–36)

## 2019-01-06 LAB — CBC
HCT: 34.2 % — ABNORMAL LOW (ref 36.0–46.0)
HEMOGLOBIN: 11.1 g/dL — AB (ref 12.0–15.0)
MCH: 30.5 pg (ref 26.0–34.0)
MCHC: 32.5 g/dL (ref 30.0–36.0)
MCV: 94 fL (ref 80.0–100.0)
Platelets: 251 10*3/uL (ref 150–400)
RBC: 3.64 MIL/uL — ABNORMAL LOW (ref 3.87–5.11)
RDW: 12.6 % (ref 11.5–15.5)
WBC: 7.7 10*3/uL (ref 4.0–10.5)
nRBC: 0 % (ref 0.0–0.2)

## 2019-01-06 LAB — SURGICAL PCR SCREEN
MRSA, PCR: NEGATIVE
Staphylococcus aureus: POSITIVE — AB

## 2019-01-06 LAB — ABO/RH: ABO/RH(D): O NEG

## 2019-01-06 LAB — TSH: TSH: 1.434 u[IU]/mL (ref 0.350–4.500)

## 2019-01-06 LAB — CREATININE, URINE, RANDOM: Creatinine, Urine: 11.55 mg/dL

## 2019-01-06 LAB — SODIUM, URINE, RANDOM: Sodium, Ur: 15 mmol/L

## 2019-01-06 LAB — OSMOLALITY, URINE: Osmolality, Ur: 482 mOsm/kg (ref 300–900)

## 2019-01-06 SURGERY — ARTHROPLASTY, HIP, TOTAL, ANTERIOR APPROACH
Anesthesia: Spinal | Site: Hip | Laterality: Left

## 2019-01-06 MED ORDER — LACTATED RINGERS IV SOLN
INTRAVENOUS | Status: DC | PRN
  Administered 2019-01-06 (×2): via INTRAVENOUS

## 2019-01-06 MED ORDER — DIPHENHYDRAMINE HCL 12.5 MG/5ML PO ELIX: 12.5000 mg | ORAL_SOLUTION | ORAL | Status: DC | PRN

## 2019-01-06 MED ORDER — BUPIVACAINE LIPOSOME 1.3 % IJ SUSP
20.0000 mL | Freq: Once | INTRAMUSCULAR | Status: AC
  Administered 2019-01-06: 20 mL
  Filled 2019-01-06: qty 20

## 2019-01-06 MED ORDER — POVIDONE-IODINE 10 % EX SWAB: 2.0000 "application " | Freq: Once | CUTANEOUS | Status: DC

## 2019-01-06 MED ORDER — WATER FOR IRRIGATION, STERILE IR SOLN
Status: DC | PRN
  Administered 2019-01-06: 2000 mL via SURGICAL_CAVITY

## 2019-01-06 MED ORDER — CHLORHEXIDINE GLUCONATE 4 % EX LIQD
60.0000 mL | Freq: Once | CUTANEOUS | Status: AC
  Administered 2019-01-06: 4 via TOPICAL
  Filled 2019-01-06: qty 60

## 2019-01-06 MED ORDER — DEXAMETHASONE SODIUM PHOSPHATE 10 MG/ML IJ SOLN
INTRAMUSCULAR | Status: DC | PRN
  Administered 2019-01-06: 8 mg via INTRAVENOUS

## 2019-01-06 MED ORDER — LACTATED RINGERS IV SOLN: INTRAVENOUS | Status: DC

## 2019-01-06 MED ORDER — DM-GUAIFENESIN ER 30-600 MG PO TB12: 1.0000 | ORAL_TABLET | Freq: Two times a day (BID) | ORAL | Status: DC | PRN

## 2019-01-06 MED ORDER — FENTANYL CITRATE (PF) 100 MCG/2ML IJ SOLN: 25.0000 ug | INTRAMUSCULAR | Status: DC | PRN

## 2019-01-06 MED ORDER — ONDANSETRON HCL 4 MG PO TABS: 4.0000 mg | ORAL_TABLET | Freq: Four times a day (QID) | ORAL | Status: DC | PRN

## 2019-01-06 MED ORDER — ACETAMINOPHEN 325 MG PO TABS
325.0000 mg | ORAL_TABLET | Freq: Four times a day (QID) | ORAL | Status: DC | PRN
  Administered 2019-01-07 (×2): 650 mg via ORAL
  Filled 2019-01-06 (×2): qty 2

## 2019-01-06 MED ORDER — PROPOFOL 10 MG/ML IV BOLUS
INTRAVENOUS | Status: AC
  Filled 2019-01-06: qty 20

## 2019-01-06 MED ORDER — SODIUM CHLORIDE (PF) 0.9 % IJ SOLN
INTRAMUSCULAR | Status: AC
  Filled 2019-01-06: qty 50

## 2019-01-06 MED ORDER — TRANEXAMIC ACID-NACL 1000-0.7 MG/100ML-% IV SOLN
1000.0000 mg | Freq: Once | INTRAVENOUS | Status: AC
  Administered 2019-01-06: 1000 mg via INTRAVENOUS
  Filled 2019-01-06: qty 100

## 2019-01-06 MED ORDER — BUPIVACAINE-EPINEPHRINE 0.25% -1:200000 IJ SOLN
INTRAMUSCULAR | Status: DC | PRN
  Administered 2019-01-06: 20 mL

## 2019-01-06 MED ORDER — ALUM & MAG HYDROXIDE-SIMETH 200-200-20 MG/5ML PO SUSP: 30.0000 mL | ORAL | Status: DC | PRN

## 2019-01-06 MED ORDER — ONDANSETRON HCL 4 MG/2ML IJ SOLN
INTRAMUSCULAR | Status: AC
  Filled 2019-01-06: qty 2

## 2019-01-06 MED ORDER — HYDROCODONE-ACETAMINOPHEN 5-325 MG PO TABS: 1.0000 | ORAL_TABLET | Freq: Four times a day (QID) | ORAL | 0 refills | Status: DC | PRN

## 2019-01-06 MED ORDER — DEXAMETHASONE SODIUM PHOSPHATE 10 MG/ML IJ SOLN
10.0000 mg | Freq: Two times a day (BID) | INTRAMUSCULAR | Status: AC
  Administered 2019-01-06 – 2019-01-07 (×3): 10 mg via INTRAVENOUS
  Filled 2019-01-06 (×3): qty 1

## 2019-01-06 MED ORDER — HYDROCODONE-ACETAMINOPHEN 5-325 MG PO TABS
1.0000 | ORAL_TABLET | ORAL | Status: DC | PRN
  Administered 2019-01-06 (×3): 1 via ORAL
  Filled 2019-01-06: qty 1
  Filled 2019-01-06: qty 2
  Filled 2019-01-06: qty 1

## 2019-01-06 MED ORDER — ASPIRIN EC 325 MG PO TBEC: 325.0000 mg | DELAYED_RELEASE_TABLET | Freq: Two times a day (BID) | ORAL | 0 refills | Status: DC

## 2019-01-06 MED ORDER — CHLORHEXIDINE GLUCONATE CLOTH 2 % EX PADS: 6.0000 | MEDICATED_PAD | Freq: Every day | CUTANEOUS | Status: DC

## 2019-01-06 MED ORDER — TRANEXAMIC ACID-NACL 1000-0.7 MG/100ML-% IV SOLN
1000.0000 mg | INTRAVENOUS | Status: AC
  Administered 2019-01-06: 1000 mg via INTRAVENOUS
  Filled 2019-01-06: qty 100

## 2019-01-06 MED ORDER — CELECOXIB 200 MG PO CAPS
200.0000 mg | ORAL_CAPSULE | Freq: Two times a day (BID) | ORAL | Status: DC
  Administered 2019-01-06 – 2019-01-07 (×3): 200 mg via ORAL
  Filled 2019-01-06 (×3): qty 1

## 2019-01-06 MED ORDER — CEFAZOLIN SODIUM-DEXTROSE 2-4 GM/100ML-% IV SOLN
2.0000 g | Freq: Four times a day (QID) | INTRAVENOUS | Status: AC
  Administered 2019-01-06 (×2): 2 g via INTRAVENOUS
  Filled 2019-01-06 (×2): qty 100

## 2019-01-06 MED ORDER — POLYETHYLENE GLYCOL 3350 17 G PO PACK: 17.0000 g | PACK | Freq: Every day | ORAL | Status: DC | PRN

## 2019-01-06 MED ORDER — ONDANSETRON HCL 4 MG/2ML IJ SOLN: 4.0000 mg | Freq: Four times a day (QID) | INTRAMUSCULAR | Status: DC | PRN

## 2019-01-06 MED ORDER — BISACODYL 5 MG PO TBEC: 5.0000 mg | DELAYED_RELEASE_TABLET | Freq: Every day | ORAL | Status: DC | PRN

## 2019-01-06 MED ORDER — 0.9 % SODIUM CHLORIDE (POUR BTL) OPTIME
TOPICAL | Status: DC | PRN
  Administered 2019-01-06: 1000 mL

## 2019-01-06 MED ORDER — BUPIVACAINE-EPINEPHRINE (PF) 0.25% -1:200000 IJ SOLN
INTRAMUSCULAR | Status: AC
  Filled 2019-01-06: qty 30

## 2019-01-06 MED ORDER — MUPIROCIN 2 % EX OINT
1.0000 "application " | TOPICAL_OINTMENT | Freq: Two times a day (BID) | CUTANEOUS | Status: DC
  Administered 2019-01-07: 1 via NASAL
  Filled 2019-01-06: qty 22

## 2019-01-06 MED ORDER — ASPIRIN EC 325 MG PO TBEC
325.0000 mg | DELAYED_RELEASE_TABLET | Freq: Two times a day (BID) | ORAL | Status: DC
  Administered 2019-01-06 – 2019-01-07 (×2): 325 mg via ORAL
  Filled 2019-01-06 (×2): qty 1

## 2019-01-06 MED ORDER — GABAPENTIN 300 MG PO CAPS
300.0000 mg | ORAL_CAPSULE | Freq: Two times a day (BID) | ORAL | Status: DC
  Administered 2019-01-06 – 2019-01-07 (×3): 300 mg via ORAL
  Filled 2019-01-06 (×3): qty 1

## 2019-01-06 MED ORDER — CEFAZOLIN SODIUM-DEXTROSE 2-4 GM/100ML-% IV SOLN
2.0000 g | INTRAVENOUS | Status: AC
  Administered 2019-01-06: 2 g via INTRAVENOUS

## 2019-01-06 MED ORDER — TIZANIDINE HCL 2 MG PO TABS: 2.0000 mg | ORAL_TABLET | Freq: Three times a day (TID) | ORAL | 0 refills | Status: DC | PRN

## 2019-01-06 MED ORDER — MORPHINE SULFATE (PF) 4 MG/ML IV SOLN: 0.5000 mg | INTRAVENOUS | Status: DC | PRN

## 2019-01-06 MED ORDER — PROPOFOL 10 MG/ML IV BOLUS
INTRAVENOUS | Status: AC
  Filled 2019-01-06: qty 40

## 2019-01-06 MED ORDER — PHENYLEPHRINE 40 MCG/ML (10ML) SYRINGE FOR IV PUSH (FOR BLOOD PRESSURE SUPPORT)
PREFILLED_SYRINGE | INTRAVENOUS | Status: DC | PRN
  Administered 2019-01-06 (×2): 80 ug via INTRAVENOUS

## 2019-01-06 MED ORDER — PROPOFOL 500 MG/50ML IV EMUL
INTRAVENOUS | Status: DC | PRN
  Administered 2019-01-06: 75 ug/kg/min via INTRAVENOUS

## 2019-01-06 MED ORDER — MAGNESIUM CITRATE PO SOLN: 1.0000 | Freq: Once | ORAL | Status: DC | PRN

## 2019-01-06 MED ORDER — ONDANSETRON HCL 4 MG/2ML IJ SOLN
INTRAMUSCULAR | Status: DC | PRN
  Administered 2019-01-06: 4 mg via INTRAVENOUS

## 2019-01-06 MED ORDER — PHENYLEPHRINE 40 MCG/ML (10ML) SYRINGE FOR IV PUSH (FOR BLOOD PRESSURE SUPPORT)
PREFILLED_SYRINGE | INTRAVENOUS | Status: AC
  Filled 2019-01-06: qty 10

## 2019-01-06 MED ORDER — CEFAZOLIN SODIUM-DEXTROSE 2-4 GM/100ML-% IV SOLN
INTRAVENOUS | Status: AC
  Filled 2019-01-06: qty 100

## 2019-01-06 MED ORDER — LACTATED RINGERS IV SOLN
INTRAVENOUS | Status: DC | PRN
  Administered 2019-01-06 (×2): via INTRAVENOUS

## 2019-01-06 MED ORDER — BUPIVACAINE IN DEXTROSE 0.75-8.25 % IT SOLN
INTRATHECAL | Status: DC | PRN
  Administered 2019-01-06: 1.8 mL via INTRATHECAL

## 2019-01-06 MED ORDER — SODIUM CHLORIDE 0.9% FLUSH
INTRAVENOUS | Status: DC | PRN
  Administered 2019-01-06: 20 mL

## 2019-01-06 MED ORDER — ONDANSETRON HCL 4 MG/2ML IJ SOLN: 4.0000 mg | Freq: Once | INTRAMUSCULAR | Status: DC | PRN

## 2019-01-06 MED ORDER — DEXAMETHASONE SODIUM PHOSPHATE 10 MG/ML IJ SOLN
INTRAMUSCULAR | Status: AC
  Filled 2019-01-06: qty 1

## 2019-01-06 MED ORDER — DOCUSATE SODIUM 100 MG PO CAPS
100.0000 mg | ORAL_CAPSULE | Freq: Two times a day (BID) | ORAL | Status: DC
  Administered 2019-01-06 – 2019-01-07 (×3): 100 mg via ORAL
  Filled 2019-01-06 (×3): qty 1

## 2019-01-06 MED ORDER — PROPOFOL 10 MG/ML IV BOLUS
INTRAVENOUS | Status: DC | PRN
  Administered 2019-01-06 (×2): 20 mg via INTRAVENOUS
  Administered 2019-01-06: 40 mg via INTRAVENOUS

## 2019-01-06 SURGICAL SUPPLY — 39 items
BAG ZIPLOCK 12X15 (MISCELLANEOUS) ×2 IMPLANT
BENZOIN TINCTURE PRP APPL 2/3 (GAUZE/BANDAGES/DRESSINGS) ×4 IMPLANT
BLADE SAW SGTL 18X1.27X75 (BLADE) ×2 IMPLANT
BLADE SURG SZ10 CARB STEEL (BLADE) ×4 IMPLANT
CLOSURE STERI-STRIP 1/4X4 (GAUZE/BANDAGES/DRESSINGS) ×2 IMPLANT
COVER PERINEAL POST (MISCELLANEOUS) ×2 IMPLANT
COVER SURGICAL LIGHT HANDLE (MISCELLANEOUS) ×2 IMPLANT
COVER WAND RF STERILE (DRAPES) IMPLANT
CUP ACETBLR 48 OD 100 SERIES (Hips) ×2 IMPLANT
DRAPE STERI IOBAN 125X83 (DRAPES) ×2 IMPLANT
DRAPE U-SHAPE 47X51 STRL (DRAPES) ×4 IMPLANT
DRESSING AQUACEL AG SP 3.5X6 (GAUZE/BANDAGES/DRESSINGS) ×1 IMPLANT
DRSG AQUACEL AG ADV 3.5X 6 (GAUZE/BANDAGES/DRESSINGS) ×2 IMPLANT
DRSG AQUACEL AG SP 3.5X6 (GAUZE/BANDAGES/DRESSINGS) ×2
DURAPREP 26ML APPLICATOR (WOUND CARE) ×2 IMPLANT
ELECT REM PT RETURN 15FT ADLT (MISCELLANEOUS) ×2 IMPLANT
ELIMINATOR HOLE APEX DEPUY (Hips) ×2 IMPLANT
GAUZE XEROFORM 1X8 LF (GAUZE/BANDAGES/DRESSINGS) IMPLANT
GLOVE BIOGEL PI IND STRL 8 (GLOVE) ×2 IMPLANT
GLOVE BIOGEL PI INDICATOR 8 (GLOVE) ×2
GLOVE ECLIPSE 7.5 STRL STRAW (GLOVE) ×4 IMPLANT
GOWN STRL REUS W/TWL XL LVL3 (GOWN DISPOSABLE) ×4 IMPLANT
HEAD FEMORAL 32 CERAMIC (Hips) ×2 IMPLANT
HOLDER FOLEY CATH W/STRAP (MISCELLANEOUS) ×2 IMPLANT
HOOD PEEL AWAY FLYTE STAYCOOL (MISCELLANEOUS) ×4 IMPLANT
NEEDLE HYPO 22GX1.5 SAFETY (NEEDLE) ×2 IMPLANT
PACK ANTERIOR HIP CUSTOM (KITS) ×2 IMPLANT
PINN ALTRX NEUT ID X OD 32X48 ×2 IMPLANT
STAPLER VISISTAT 35W (STAPLE) IMPLANT
STEM CORAIL KA11 (Stem) ×2 IMPLANT
STRIP CLOSURE SKIN 1/2X4 (GAUZE/BANDAGES/DRESSINGS) ×2 IMPLANT
SUT ETHIBOND NAB CT1 #1 30IN (SUTURE) ×4 IMPLANT
SUT MNCRL AB 3-0 PS2 18 (SUTURE) ×2 IMPLANT
SUT VIC AB 0 CT1 36 (SUTURE) ×4 IMPLANT
SUT VIC AB 1 CT1 36 (SUTURE) ×2 IMPLANT
SUT VIC AB 2-0 CT1 27 (SUTURE) ×1
SUT VIC AB 2-0 CT1 TAPERPNT 27 (SUTURE) ×1 IMPLANT
TRAY FOLEY MTR SLVR 16FR STAT (SET/KITS/TRAYS/PACK) ×2 IMPLANT
YANKAUER SUCT BULB TIP 10FT TU (MISCELLANEOUS) ×2 IMPLANT

## 2019-01-06 NOTE — Discharge Instructions (Signed)

## 2019-01-06 NOTE — Transfer of Care (Signed)
Immediate Anesthesia Transfer of Care Note  Patient: Heather Lindsey  Procedure(s) Performed: Procedure(s): TOTAL HIP ARTHROPLASTY ANTERIOR APPROACH (Left)  Patient Location: PACU  Anesthesia Type:Spinal  Level of Consciousness:  sedated, patient cooperative and responds to stimulation  Airway & Oxygen Therapy:Patient Spontanous Breathing and Patient connected to face mask oxgen  Post-op Assessment:  Report given to PACU RN and Post -op Vital signs reviewed and stable  Post vital signs:  Reviewed and stable  Last Vitals:  Vitals:   01/05/19 2308 01/06/19 0530  BP: 136/79 134/66  Pulse: 75 74  Resp: 18 18  Temp: 36.9 C 37.1 C  SpO2: 93% 26%    Complications: No apparent anesthesia complications

## 2019-01-06 NOTE — Anesthesia Procedure Notes (Signed)
Spinal  Patient location during procedure: OR Start time: 01/06/2019 7:40 AM End time: 01/06/2019 7:45 AM Staffing Anesthesiologist: Murvin Natal, MD Performed: anesthesiologist  Preanesthetic Checklist Completed: patient identified, surgical consent, pre-op evaluation, timeout performed, IV checked, risks and benefits discussed and monitors and equipment checked Spinal Block Patient position: left lateral decubitus Prep: DuraPrep Patient monitoring: cardiac monitor, continuous pulse ox and blood pressure Approach: left paramedian Location: L4-5 Injection technique: single-shot Needle Needle type: Pencan  Needle gauge: 24 G Needle length: 9 cm Assessment Sensory level: T10 Additional Notes Functioning IV was confirmed and monitors were applied. Sterile prep and drape, including hand hygiene and sterile gloves were used. The patient was positioned and the spine was prepped. The skin was anesthetized with lidocaine.  Free flow of clear CSF was obtained prior to injecting local anesthetic into the CSF.  The spinal needle aspirated freely following injection.  The needle was carefully withdrawn.  The patient tolerated the procedure well.

## 2019-01-06 NOTE — Op Note (Signed)
NAMEJeani, [place] [facility] MEDICAL RECORD [id number] ACCOUNT [id number] DATE OF BIRTH:Jul 16, 1946 FACILITY: [facility] LOCATION: [id number] PHYSICIAN:Ajia Chadderdon L. Jaia Alonge, MD  OPERATIVE REPORT  DATE OF PROCEDURE:  01/06/2019  This is a 73 year old female on the Internal Medicine service.    PREOPERATIVE DIAGNOSIS:  Femoral neck fracture with displacement, left hip.  POSTOPERATIVE DIAGNOSIS:  Femoral neck fracture with displacement, left hip.  PROCEDURE: 1.  Left total hip replacement with a Corail stem size 12, 48 mm porous coated cup, 32 mm +1.5 delta ceramic hip ball, and a +4 liner accepting a 32 mm hip ball. 2.  Interpretation of multiple intraoperative fluoroscopic images  SURGEON:  Dorna Leitz, MD  ASSISTANT:  Gaspar Skeeters, PA-C, was present for the entire case and assisted with retraction and manipulation of the leg and closing to minimize OR time.  BRIEF HISTORY:  The patient is a 73 year old female who has an interesting history of having had some hip pain with an x-ray that was negative and ultimately an additional x-ray which was taken after she fell showing a displaced femoral neck fracture.   She originally got to [facility] point and transferred to [facility] for definitive care.  She was evaluated and cleared via the internal medicine service.  She was taken to the operating room for treatment.  Given her young age, extremely active  lifestyle, and certainly greater than 10-year time horizon, I felt that total hip replacement was the appropriate course of action.  She was brought to the operating room with this in mind as a procedure for the [place] bed.  DESCRIPTION OF PROCEDURE:  The patient brought to the operating room after adequate anesthesia was obtained with spinal anesthetic, the patient was placed supine on the operating table.  Left hip was then prepped and draped in usual sterile fashion.   Following this, the incision was made for an anterior approach to the hip subcutaneous tissue down  to the level of the tensor fascia.  Tensor fascia was divided in line with its fibers and finger dissected.  Retractors were put in place above and below  the neck.  The hip capsule was opened and tagged.  A provisional neck cut was made just completing her fracture and with the rest of the neck cut.  Once this was done, the head was removed and measured on the back table to 43.  At this point, the  retractors were put in place above and below the acetabulum.  The acetabulum was well visualized sequentially reamed to a level of 47 mm and a 48 mm porous coated cup was put into place with 45 degrees of lateral opening and 30 degrees of anteversion.   Once this was completed, attention was turned to the stem side where the stem was put in externally rotated, abducted and extended position.  Sequentially it was rasped up to a level of 11.  Following this, the standard offset head was used and the hip  was put in place.  At that point, it was looking to be a little bit short.  We went up to a 12 stem and placed that and then trialed her with a +5.  That was too long so we went back to a delta ceramic +1.5 hip ball and this gave her the perfect length.   Fluoroscopy was used throughout the case to help with the leg length and also with the offset and fit and fill.  Of note, Gaspar Skeeters was present through the entire case and assisted  with retraction, manipulation of the leg, and closing to minimize OR  time.  Fluoroscopy was used throughout to assess leg length and fit and fill of the stem.    ESTIMATED BLOOD LOSS:  200 mL.  COMPLICATIONS:  None.  TN/NUANCE  D:01/06/2019 T:01/06/2019 JOB:[id number]

## 2019-01-06 NOTE — Anesthesia Preprocedure Evaluation (Signed)
Anesthesia Evaluation  Patient identified by MRN, date of birth, ID band Patient awake    Reviewed: Allergy & Precautions, NPO status , Patient's Chart, lab work & pertinent test results  Airway Mallampati: III  TM Distance: >3 FB Neck ROM: Full    Dental no notable dental hx.    Pulmonary former smoker,    Pulmonary exam normal breath sounds clear to auscultation       Cardiovascular hypertension, Pt. on medications Normal cardiovascular exam Rhythm:Regular Rate:Normal     Neuro/Psych  Headaches, PSYCHIATRIC DISORDERS Anxiety Depression CVA, No Residual Symptoms    GI/Hepatic Neg liver ROS, GERD  Medicated and Controlled,IBS (irritable bowel syndrome)   Endo/Other  negative endocrine ROS  Renal/GU negative Renal ROS     Musculoskeletal negative musculoskeletal ROS (+)   Abdominal   Peds  Hematology  (+) anemia , HLD   Anesthesia Other Findings left femoral neck  fracture  Reproductive/Obstetrics                             Anesthesia Physical Anesthesia Plan  ASA: III  Anesthesia Plan: Spinal   Post-op Pain Management:    Induction: Intravenous  PONV Risk Score and Plan: 2 and Ondansetron, Dexamethasone, Midazolam and Treatment may vary due to age or medical condition  Airway Management Planned: Natural Airway  Additional Equipment:   Intra-op Plan:   Post-operative Plan:   Informed Consent: I have reviewed the patients History and Physical, chart, labs and discussed the procedure including the risks, benefits and alternatives for the proposed anesthesia with the patient or authorized representative who has indicated his/her understanding and acceptance.     Dental advisory given  Plan Discussed with: CRNA  Anesthesia Plan Comments: (M-M: spinal and dexamethasone)        Anesthesia Quick Evaluation

## 2019-01-06 NOTE — Brief Op Note (Signed)
01/05/2019 - 01/06/2019  9:15 AM  PATIENT:  Heather Lindsey  73 y.o. female  PRE-OPERATIVE DIAGNOSIS:  left femoral neck  fracture  POST-OPERATIVE DIAGNOSIS:  left femoral neck  fracture  PROCEDURE:  Procedure(s): TOTAL HIP ARTHROPLASTY ANTERIOR APPROACH (Left)  SURGEON:  Surgeon(s) and Role:    Dorna Leitz, MD - Primary  PHYSICIAN ASSISTANT:   ASSISTANTS: jim bethune   ANESTHESIA:   spinal  EBL:  200 mL   BLOOD ADMINISTERED:none  DRAINS: none   LOCAL MEDICATIONS USED:  MARCAINE    and OTHER experel  SPECIMEN:  No Specimen  DISPOSITION OF SPECIMEN:  N/A  COUNTS:  YES  TOURNIQUET:  * No tourniquets in log *  DICTATION: .Other Dictation: Dictation Number [id number]  PLAN OF CARE: Admit to inpatient   PATIENT DISPOSITION:  PACU - hemodynamically stable.   Delay start of Pharmacological VTE agent (>24hrs) due to surgical blood loss or risk of bleeding: no

## 2019-01-06 NOTE — Progress Notes (Signed)
[facility] PROGRESS NOTE  Heather Lindsey [id number]:[id number] DOB: October 11, 1946 DOA: 01/05/2019  PCP: Prince Solian, MD  Brief History/Interval Summary: 73 y.o. female with medical history of osteoporosis, Raynaud's syndrome, hyperlipidemia, stroke, GERD, depression, anxiety, intracranial bleeding, migraine headaches, IBS, memory loss, arthritis,who presented with left hip pain.  However she denies any falls or injuries.  She was found to have a left hip fracture.  Hospitalized for further management.  Reason for Visit: Left hip fracture  Consultants: Dr. Berenice Primas with orthopedics  Procedures: Arthroplasty anterior approach on 1/26  Antibiotics: Cefazolin per surgery protocol  Subjective/Interval History: Patient seen after she returned from the OR.  She states that the pain in the left hip is well controlled at this time.  She denies any shortness of breath.  No nausea vomiting.  ROS: Denies any chest pain  Objective:  Vital Signs  Vitals:   01/06/19 1030 01/06/19 1044 01/06/19 1103 01/06/19 1229  BP: (!) 143/70 (!) 143/72 139/70 139/71  Pulse: 66 65 68 70  Resp: 12 12 16 14   Temp:  97.9 F (36.6 C) 98.4 F (36.9 C) 98.3 F (36.8 C)  TempSrc:   Oral Oral  SpO2: 100% 100% 97% 100%  Weight:      Height:        Intake/Output Summary (Last 24 hours) at 01/06/2019 1315 Last data filed at 01/06/2019 1238 Gross per 24 hour  Intake 3777.64 ml  Output 4125 ml  Net -347.36 ml   Filed Weights   01/05/19 1618  Weight: 65.3 kg    General appearance: Awake alert.  In no distress Resp: Clear to auscultation bilaterally.  Normal effort Cardio: S1-S2 is normal regular.  No S3-S4.  No rubs murmurs or bruit GI: Abdomen is soft.  Nontender nondistended.  Bowel sounds are present normal.  No masses organomegaly Extremities: Able to move her feet bilaterally.  Left hip not examined due to surgery done this morning. Neurologic: Alert and oriented x3.  No focal neurological  deficits.    Lab Results:  Data Reviewed: I have personally reviewed following labs and imaging studies  CBC: Recent Labs  Lab 01/05/19 1903 01/06/19 0404  WBC 9.8 7.7  NEUTROABS 8.8*  --   HGB 11.9* 11.1*  HCT 37.3 34.2*  MCV 94.4 94.0  PLT 269 967    Basic Metabolic Panel: Recent Labs  Lab 01/05/19 1903 01/06/19 0404  NA 129* 139  K 3.9 3.8  CL 98 107  CO2 22 25  GLUCOSE 143* 93  BUN 23 16  CREATININE 0.68 0.60  CALCIUM 8.9 8.5*    GFR: Estimated Creatinine Clearance: 55.7 mL/min (by C-G formula based on SCr of 0.6 mg/dL).   Coagulation Profile: Recent Labs  Lab 01/06/19 0011  INR 0.95    Thyroid Function Tests: Recent Labs    01/06/19 0404  TSH 1.434     Recent Results (from the past 240 hour(s))  Surgical pcr screen     Status: Abnormal   Collection Time: 01/06/19 12:29 AM  Result Value Ref Range Status   MRSA, PCR NEGATIVE NEGATIVE Final   Staphylococcus aureus POSITIVE (A) NEGATIVE Final    Comment: (NOTE) The Xpert SA Assay (FDA approved for NASAL specimens in patients 62 years of age and older), is one component of a comprehensive surveillance program. It is not intended to diagnose infection nor to guide or monitor treatment. Performed at [facility], [place] [address], [place], [place] [address]  Radiology Studies: Dg Lumbar Spine Complete  Result Date: 01/05/2019 CLINICAL DATA:  Low back pain for 2 days, no known injury, initial encounter EXAM: LUMBAR SPINE - COMPLETE 4+ VIEW COMPARISON:  None. FINDINGS: Five lumbar type vertebral bodies are well visualized. Vertebral body height is well maintained. Facet hypertrophic changes are seen. Degenerative anterolisthesis of L5 on S1 is noted likely of a chronic nature. No other focal abnormality is seen. IMPRESSION: Degenerative change with anterolisthesis of L5 on S1. Electronically Signed   By: Inez Catalina M.D.   On: 01/05/2019 18:55   Dg Chest Port 1  View  Result Date: 01/06/2019 CLINICAL DATA:  Cough. Preop. EXAM: PORTABLE CHEST 1 VIEW COMPARISON:  None. FINDINGS: Low lung volumes. Mild patient rotation. Streaky bibasilar atelectasis. Heart is normal in size. Normal mediastinal contours for degree of rotation. No pneumothorax or confluent airspace disease. No pulmonary edema. No large pleural effusion. No acute osseous abnormalities are seen. IMPRESSION: Low lung volumes with streaky bibasilar atelectasis. Electronically Signed   By: Keith Rake M.D.   On: 01/06/2019 00:46   Dg C-arm 1-60 Min-no Report  Result Date: 01/06/2019 Fluoroscopy was utilized by the requesting physician.  No radiographic interpretation.   Dg Hip Operative Unilat W Or W/o Pelvis Left  Result Date: 01/06/2019 CLINICAL DATA:  Left total hip replacement status post fracture EXAM: OPERATIVE left HIP (WITH PELVIS IF PERFORMED) left VIEWS TECHNIQUE: Fluoroscopic spot image(s) were submitted for interpretation post-operatively. COMPARISON:  01/05/2019 FINDINGS: Portable intraoperative radiographs of the show removal of fractured left hip with placement a left hip arthroplasty device. The hardware components are in anatomic alignment and there are no complications identified. IMPRESSION: 1. Status post left hip arthroplasty for left femoral neck fracture. Hardware components are in anatomic alignment without complications. Electronically Signed   By: Kerby Moors M.D.   On: 01/06/2019 10:39   Dg Hip Unilat W Or Wo Pelvis 2-3 Views Left  Result Date: 01/05/2019 CLINICAL DATA:  Left hip pain, no known injury EXAM: DG HIP (WITH OR WITHOUT PELVIS) 3V LEFT COMPARISON:  None. FINDINGS: There is an oblique fracture through the left femoral neck at the junction of the femoral neck and intratrochanteric region. Impaction and angulation is noted at the fracture site. No other fractures are seen. IMPRESSION: Left femoral neck fracture inferiorly Electronically Signed   By: Inez Catalina M.D.   On: 01/05/2019 18:56     Medications:  Scheduled: . amLODipine  5 mg Oral Daily  . aspirin EC  325 mg Oral BID PC  . celecoxib  200 mg Oral BID  . dexamethasone  10 mg Intravenous Q12H  . docusate sodium  100 mg Oral BID  . gabapentin  300 mg Oral BID  . multivitamin-iron-minerals-folic acid  1 tablet Oral Daily  . mupirocin ointment  1 application Nasal BID  . pantoprazole  40 mg Oral Daily  . venlafaxine  75 mg Oral Daily   Continuous: . sodium chloride 75 mL/hr at 01/06/19 1107  .  ceFAZolin (ANCEF) IV     PRN:[START ON 01/07/2019] acetaminophen, ALPRAZolam, alum & mag hydroxide-simeth, bisacodyl, calcium carbonate, diphenhydrAMINE, hydrALAZINE, HYDROcodone-acetaminophen, magnesium citrate, methocarbamol, morphine injection, ondansetron **OR** ondansetron (ZOFRAN) IV, polyethylene glycol, senna-docusate, zolpidem    Assessment/Plan:    Left hip fracture Seen by orthopedics.  Underwent total left hip arthroplasty this morning.  Seems to be stable postoperatively.  Pain medication.  Muscle relaxants.  PT and OT per orthopedics.  Depression and anxiety Stable.  Continue home  medications.  History of morphea Stable.  Methotrexate on hold currently.  Noted to be on celecoxib.  Patient recently has been on prednisone taper.  So she is getting dexamethasone presumably for stress dosing.  History of Raynauds phenomenon without gangrene Continue amlodipine.  History of GERD Continue Protonix.  Normocytic anemia Monitor hemoglobin closely.  Check anemia panel.  Hyponatremia Sodium level has improved.  Probably low due to hypovolemia.  Cough Chest x-ray did not show any infiltrates.  Could have had an upper respiratory tract infection which is improving.  She denies any shortness of breath.  DVT Prophylaxis: Per orthopedics    Code Status: Full code Family Communication: Discussed with the patient Disposition Plan: Management as outlined above.    LOS:  1 day   Ciana Simmon [employer] on [email or web address]  01/06/2019, 1:15 PM

## 2019-01-06 NOTE — Anesthesia Postprocedure Evaluation (Signed)
Anesthesia Post Note  Patient: Heather Lindsey  Procedure(s) Performed: TOTAL HIP ARTHROPLASTY ANTERIOR APPROACH (Left Hip)     Patient location during evaluation: PACU Anesthesia Type: Spinal Level of consciousness: oriented and awake and alert Pain management: pain level controlled Vital Signs Assessment: post-procedure vital signs reviewed and stable Respiratory status: spontaneous breathing, respiratory function stable and patient connected to nasal cannula oxygen Cardiovascular status: blood pressure returned to baseline and stable Postop Assessment: no headache, no backache, no apparent nausea or vomiting and spinal receding Anesthetic complications: no    Last Vitals:  Vitals:   01/06/19 1044 01/06/19 1103  BP: (!) 143/72 139/70  Pulse: 65 68  Resp: 12 16  Temp: 36.6 C 36.9 C  SpO2: 100% 97%    Last Pain:  Vitals:   01/06/19 1144  TempSrc:   PainSc: 4                  Ryan P Ellender

## 2019-01-07 ENCOUNTER — Encounter (HOSPITAL_COMMUNITY): Payer: Self-pay | Admitting: Orthopedic Surgery

## 2019-01-07 LAB — IRON AND TIBC
Iron: 13 ug/dL — ABNORMAL LOW (ref 28–170)
Saturation Ratios: 6 % — ABNORMAL LOW (ref 10.4–31.8)
TIBC: 231 ug/dL — AB (ref 250–450)
UIBC: 218 ug/dL

## 2019-01-07 LAB — BASIC METABOLIC PANEL
Anion gap: 7 (ref 5–15)
BUN: 13 mg/dL (ref 8–23)
CO2: 24 mmol/L (ref 22–32)
Calcium: 8.4 mg/dL — ABNORMAL LOW (ref 8.9–10.3)
Chloride: 108 mmol/L (ref 98–111)
Creatinine, Ser: 0.67 mg/dL (ref 0.44–1.00)
GFR calc Af Amer: >60 mL/min (ref >60)
GFR calc non Af Amer: >60 mL/min (ref >60)
Glucose, Bld: 159 mg/dL — ABNORMAL HIGH (ref 70–99)
Potassium: 3.8 mmol/L (ref 3.5–5.1)
Sodium: 139 mmol/L (ref 135–145)

## 2019-01-07 LAB — FERRITIN: Ferritin: 112 ng/mL (ref 11–307)

## 2019-01-07 LAB — CBC
HCT: 32.1 % — ABNORMAL LOW (ref 36.0–46.0)
Hemoglobin: 10.2 g/dL — ABNORMAL LOW (ref 12.0–15.0)
MCH: 30.1 pg (ref 26.0–34.0)
MCHC: 31.8 g/dL (ref 30.0–36.0)
MCV: 94.7 fL (ref 80.0–100.0)
Platelets: 219 10*3/uL (ref 150–400)
RBC: 3.39 MIL/uL — AB (ref 3.87–5.11)
RDW: 12.7 % (ref 11.5–15.5)
WBC: 10.9 10*3/uL — ABNORMAL HIGH (ref 4.0–10.5)
nRBC: 0 % (ref 0.0–0.2)

## 2019-01-07 LAB — RETICULOCYTES
Immature Retic Fract: 13.3 % (ref 2.3–15.9)
RBC.: 3.39 MIL/uL — ABNORMAL LOW (ref 3.87–5.11)
Retic Count, Absolute: 64.7 10*3/uL (ref 19.0–186.0)
Retic Ct Pct: 1.9 % (ref 0.4–3.1)

## 2019-01-07 LAB — FOLATE: Folate: 9 ng/mL (ref >5.9)

## 2019-01-07 LAB — VITAMIN B12: Vitamin B-12: 754 pg/mL (ref 180–914)

## 2019-01-07 MED ORDER — POLYETHYLENE GLYCOL 3350 17 G PO PACK: 17.0000 g | PACK | Freq: Every day | ORAL | 0 refills | Status: DC | PRN

## 2019-01-07 MED ORDER — CEPHALEXIN 500 MG PO CAPS: 500.0000 mg | ORAL_CAPSULE | Freq: Two times a day (BID) | ORAL | 0 refills | Status: AC

## 2019-01-07 MED ORDER — DOCUSATE SODIUM 100 MG PO CAPS: 100.0000 mg | ORAL_CAPSULE | Freq: Two times a day (BID) | ORAL | 0 refills | Status: DC

## 2019-01-07 NOTE — Care Management Note (Signed)
Case Management Note  Patient Details  Name: Heather Lindsey MRN: [id number] Date of Birth: 1946-11-07  Subjective/Objective:    Discharge planning, spoke with patient and spouse at bedside. Have chosen [facility] for [facility], evaluate and treat.               Action/Plan: Contacted [facility] for referral. They have accepted. No DME needs. [phone]    Expected Discharge Date:  01/07/19               Expected Discharge Plan:  [place]  In-House Referral:  NA  Discharge planning Services  CM Consult  Post Acute Care Choice:  Home Health Choice offered to:  Patient  DME Arranged:  N/A DME Agency:  NA  HH Arranged:  PT [place] Agency:  [facility] (formerly [employer])  Status of Service:  Completed, signed off  If discussed at [employer], dates discussed:    Additional Comments:  Guadalupe Maple, RN 01/07/2019, 3:25 PM

## 2019-01-07 NOTE — Progress Notes (Addendum)
[facility] PROGRESS NOTE  Kamylah Manzo QIH:[id number] DOB: 01-07-1946 DOA: 01/05/2019  PCP: Prince Solian, MD  Brief History/Interval Summary: 73 y.o. female with medical history of osteoporosis, Raynaud's syndrome, hyperlipidemia, stroke, GERD, depression, anxiety, intracranial bleeding, migraine headaches, IBS, memory loss, arthritis,who presented with left hip pain.  However she denies any falls or injuries.  She was found to have a left hip fracture.  Hospitalized for further management.  Seen by orthopedics.  Underwent surgery.  Reason for Visit: Left hip fracture  Consultants: Dr. Berenice Primas with orthopedics  Procedures: Arthroplasty anterior approach on 1/26  Antibiotics: Cefazolin per surgery protocol  Subjective/Interval History: Patient states that she is feeling well this morning.  Pain in the left hip is very well controlled.  She is looking forward to working with physical therapy today.  She has had a cough for the last 2 weeks which is improving.  She denies any shortness of breath or chest pain.  No nausea or vomiting.  ROS: Denies any headaches.  Objective:  Vital Signs  Vitals:   01/06/19 2052 01/07/19 0030 01/07/19 0515 01/07/19 1014  BP: (!) 121/59 118/61 (!) 155/78 (!) 142/79  Pulse: 77 71 73 74  Resp: 14 16 16 16   Temp: 98.1 F (36.7 C) (!) 97.5 F (36.4 C) 98 F (36.7 C) 97.8 F (36.6 C)  TempSrc: Oral Oral Oral Oral  SpO2: 96% 97% 100% 98%  Weight:      Height:        Intake/Output Summary (Last 24 hours) at 01/07/2019 1135 Last data filed at 01/07/2019 1000 Gross per 24 hour  Intake 3976.82 ml  Output 5000 ml  Net -1023.18 ml   Filed Weights   01/05/19 1618  Weight: 65.3 kg   General appearance: Awake alert.  In no distress Resp: Clear to auscultation bilaterally.  Normal effort Cardio: S1-S2 is normal regular.  No S3-S4.  No rubs murmurs or bruit GI: Abdomen is soft.  Nontender nondistended.  Bowel sounds are present normal.   No masses organomegaly Extremities: No active bleeding from the incision site on the left thigh.  Some bruising noted.  Swelling is present. Neurologic: Alert and oriented x3.  No focal neurological deficits.    Lab Results:  Data Reviewed: I have personally reviewed following labs and imaging studies  CBC: Recent Labs  Lab 01/05/19 1903 01/06/19 0404 01/07/19 0336  WBC 9.8 7.7 10.9*  NEUTROABS 8.8*  --   --   HGB 11.9* 11.1* 10.2*  HCT 37.3 34.2* 32.1*  MCV 94.4 94.0 94.7  PLT 269 251 875    Basic Metabolic Panel: Recent Labs  Lab 01/05/19 1903 01/06/19 0404 01/07/19 0336  NA 129* 139 139  K 3.9 3.8 3.8  CL 98 107 108  CO2 22 25 24   GLUCOSE 143* 93 159*  BUN 23 16 13   CREATININE 0.68 0.60 0.67  CALCIUM 8.9 8.5* 8.4*    GFR: Estimated Creatinine Clearance: 55.7 mL/min (by C-G formula based on SCr of 0.67 mg/dL).   Coagulation Profile: Recent Labs  Lab 01/06/19 0011  INR 0.95    Thyroid Function Tests: Recent Labs    01/06/19 0404  TSH 1.434     Recent Results (from the past 240 hour(s))  Surgical pcr screen     Status: Abnormal   Collection Time: 01/06/19 12:29 AM  Result Value Ref Range Status   MRSA, PCR NEGATIVE NEGATIVE Final   Staphylococcus aureus POSITIVE (A) NEGATIVE Final    Comment: (NOTE)  The Xpert SA Assay (FDA approved for NASAL specimens in patients 79 years of age and older), is one component of a comprehensive surveillance program. It is not intended to diagnose infection nor to guide or monitor treatment. Performed at [facility], [place] [address], [place], [place] [address]       Radiology Studies: Dg Lumbar Spine Complete  Result Date: 01/05/2019 CLINICAL DATA:  Low back pain for 2 days, no known injury, initial encounter EXAM: LUMBAR SPINE - COMPLETE 4+ VIEW COMPARISON:  None. FINDINGS: Five lumbar type vertebral bodies are well visualized. Vertebral body height is well maintained. Facet hypertrophic  changes are seen. Degenerative anterolisthesis of L5 on S1 is noted likely of a chronic nature. No other focal abnormality is seen. IMPRESSION: Degenerative change with anterolisthesis of L5 on S1. Electronically Signed   By: Inez Catalina M.D.   On: 01/05/2019 18:55   Dg Chest Port 1 View  Result Date: 01/06/2019 CLINICAL DATA:  Cough. Preop. EXAM: PORTABLE CHEST 1 VIEW COMPARISON:  None. FINDINGS: Low lung volumes. Mild patient rotation. Streaky bibasilar atelectasis. Heart is normal in size. Normal mediastinal contours for degree of rotation. No pneumothorax or confluent airspace disease. No pulmonary edema. No large pleural effusion. No acute osseous abnormalities are seen. IMPRESSION: Low lung volumes with streaky bibasilar atelectasis. Electronically Signed   By: Keith Rake M.D.   On: 01/06/2019 00:46   Dg C-arm 1-60 Min-no Report  Result Date: 01/06/2019 Fluoroscopy was utilized by the requesting physician.  No radiographic interpretation.   Dg Hip Operative Unilat W Or W/o Pelvis Left  Result Date: 01/06/2019 CLINICAL DATA:  Left total hip replacement status post fracture EXAM: OPERATIVE left HIP (WITH PELVIS IF PERFORMED) left VIEWS TECHNIQUE: Fluoroscopic spot image(s) were submitted for interpretation post-operatively. COMPARISON:  01/05/2019 FINDINGS: Portable intraoperative radiographs of the show removal of fractured left hip with placement a left hip arthroplasty device. The hardware components are in anatomic alignment and there are no complications identified. IMPRESSION: 1. Status post left hip arthroplasty for left femoral neck fracture. Hardware components are in anatomic alignment without complications. Electronically Signed   By: Kerby Moors M.D.   On: 01/06/2019 10:39   Dg Hip Unilat W Or Wo Pelvis 2-3 Views Left  Result Date: 01/05/2019 CLINICAL DATA:  Left hip pain, no known injury EXAM: DG HIP (WITH OR WITHOUT PELVIS) 3V LEFT COMPARISON:  None. FINDINGS: There is an  oblique fracture through the left femoral neck at the junction of the femoral neck and intratrochanteric region. Impaction and angulation is noted at the fracture site. No other fractures are seen. IMPRESSION: Left femoral neck fracture inferiorly Electronically Signed   By: Inez Catalina M.D.   On: 01/05/2019 18:56     Medications:  Scheduled: . amLODipine  5 mg Oral Daily  . aspirin EC  325 mg Oral BID PC  . celecoxib  200 mg Oral BID  . docusate sodium  100 mg Oral BID  . gabapentin  300 mg Oral BID  . multivitamin-iron-minerals-folic acid  1 tablet Oral Daily  . mupirocin ointment  1 application Nasal BID  . pantoprazole  40 mg Oral Daily  . venlafaxine  75 mg Oral Daily   Continuous: . sodium chloride 75 mL/hr at 01/07/19 1000   TKZ:[id number], ALPRAZolam, alum & mag hydroxide-simeth, bisacodyl, calcium carbonate, diphenhydrAMINE, hydrALAZINE, HYDROcodone-acetaminophen, magnesium citrate, methocarbamol, morphine injection, ondansetron **OR** ondansetron (ZOFRAN) IV, polyethylene glycol, senna-docusate, zolpidem    Assessment/Plan:  Left hip fracture Seen  by orthopedics.  Underwent total left hip arthroplasty on 1/26.  Patient remained stable.  Pain is adequately well controlled.  Muscle relaxants.  PT and OT to evaluate today.  Patient has very good functional status at baseline.  She would prefer to go home.   Anemia likely due to operative blood loss Mild drop in hemoglobin is likely due to operative blood loss.  Recheck tomorrow.  Anemia panel reviewed.  Ferritin 112.  Folate 9.0.  B12 754.  Iron noted to be 13.  TIBC 231.  Depression and anxiety Stable.  Continue home medications.  History of morphea Stable.  Methotrexate on hold currently.  Noted to be on celecoxib.  Patient recently has been on prednisone taper.  So she was given dexamethasone presumably for stress dosing.  History of Raynauds phenomenon without gangrene Continue amlodipine.  History of  GERD Continue Protonix.  Hyponatremia Sodium level has improved.  Was probably low due to hypovolemia.  Cough Chest x-ray did not show any infiltrates.  Improving.  DVT Prophylaxis: On aspirin Code Status: Full code Family Communication: Discussed with the patient Disposition Plan: Management as outlined above.  Await PT and OT evaluation.    LOS: 2 days   [facility] Pager on [email or web address]  01/07/2019, 11:35 AM

## 2019-01-07 NOTE — Progress Notes (Signed)
Subjective: 1 Day Post-Op Procedure(s) (LRB): TOTAL HIP ARTHROPLASTY ANTERIOR APPROACH (Left) Patient reports pain as mild.  Patient seen at 820 this a.m.  She was doing well.  Minimal complaints of pain.  She has not been out of bed yet.  She desires to go home when ambulating well.  Her husband is at home with her.  Objective: Vital signs in last 24 hours: Temp:  [97.5 F (36.4 C)-98.3 F (36.8 C)] 97.8 F (36.6 C) (01/27 1014) Pulse Rate:  [70-83] 74 (01/27 1014) Resp:  [14-18] 16 (01/27 1014) BP: (118-155)/(58-79) 142/79 (01/27 1014) SpO2:  [96 %-100 %] 98 % (01/27 1014)  Intake/Output from previous day: 01/26 0701 - 01/27 0700 In: 5816.8 [P.O.:1320; I.V.:4002.9; IV Piggyback:493.9] Out: 6225 [Urine:6025; Blood:200] Intake/Output this shift: Total I/O In: 1080.1 [P.O.:480; I.V.:600.1] Out: 900 [Urine:900]  Recent Labs    01/05/19 1903 01/06/19 0404 01/07/19 0336  HGB 11.9* 11.1* 10.2*   Recent Labs    01/06/19 0404 01/07/19 0336  WBC 7.7 10.9*  RBC 3.64* 3.39*  3.39*  HCT 34.2* 32.1*  PLT 251 219   Recent Labs    01/06/19 0404 01/07/19 0336  NA 139 139  K 3.8 3.8  CL 107 108  CO2 25 24  BUN 16 13  CREATININE 0.60 0.67  GLUCOSE 93 159*  CALCIUM 8.5* 8.4*   Recent Labs    01/06/19 0011  INR 0.95   Left hip exam: Neurovascular intact Sensation intact distally Intact pulses distally Dorsiflexion/Plantar flexion intact Incision: dressing C/D/I Compartment soft  Assessment/Plan: 1 Day Post-Op Procedure(s) (LRB): TOTAL HIP ARTHROPLASTY ANTERIOR APPROACH (Left) Plan: Weight-bear as tolerated on the left.  No hip precautions. Aspirin 325 mg twice daily x1 month postop for DVT prophylaxis. I did write an Rx for Keflex 500 mg twice daily x1 week to take once discharged.  This is to decrease risk of wound infection. Up with therapy Discharge home with home health as soon as does well with physical therapy. Will need follow-up with Dr. Berenice Primas in 2  weeks.  I had a lengthy discussion with the patient about her surgery and answered all of her questions.    Erlene Senters 01/07/2019, 12:26 PM

## 2019-01-07 NOTE — Evaluation (Signed)
Physical Therapy Evaluation Patient Details Name: Heather Lindsey MRN: [id number] DOB: 01-09-1946 Today's Date: 01/07/2019   History of Present Illness  s/p L DA THA secondary to fall resulting in hip fx  Clinical Impression  Pt admitted with above diagnosis. Pt currently with functional limitations due to the deficits listed below (see PT Problem List).  Pt doing well, amb ~ 160' with min/guard, will see again in pm, review stairs--pt should be able to d/c home today if pain remains controlled Pt will benefit from skilled PT to increase their independence and safety with mobility to allow discharge to the venue listed below.       Follow Up Recommendations Home health PT;Supervision - Intermittent    Equipment Recommendations  None recommended by PT    Recommendations for Other Services       Precautions / Restrictions Precautions Precautions: Fall Restrictions Weight Bearing Restrictions: No LLE Weight Bearing: Weight bearing as tolerated      Mobility  Bed Mobility Overal bed mobility: Needs Assistance Bed Mobility: Supine to Sit     Supine to sit: Min guard;Supervision     General bed mobility comments: for safety  Transfers Overall transfer level: Needs assistance Equipment used: Rolling walker (2 wheeled) Transfers: Sit to/from Stand Sit to Stand: Min guard         General transfer comment: cues for hand placement  Ambulation/Gait Ambulation/Gait assistance: Min guard Gait Distance (Feet): 160 Feet(20' more) Assistive device: Rolling walker (2 wheeled) Gait Pattern/deviations: Step-to pattern;Step-through pattern     General Gait Details: cues for sequence  Stairs            Wheelchair Mobility    Modified Rankin (Stroke Patients Only)       Balance Overall balance assessment: Needs assistance   Sitting balance-Leahy Scale: Good       Standing balance-Leahy Scale: Fair                               Pertinent  Vitals/Pain Pain Assessment: 0-10 Pain Score: 4  Pain Location: left hip Pain Descriptors / Indicators: Sore Pain Intervention(s): Monitored during session;Limited activity within patient's tolerance;Repositioned    Home Living Family/patient expects to be discharged to:: Private residence Living Arrangements: Spouse/significant other Available Help at Discharge: Family Type of Home: House Home Access: Stairs to enter   [employer] of Steps: 3 and 1 and 1 front or flight from garage (7 and 7) Home Layout: Two level;Able to live on main level with bedroom/bathroom Home Equipment: Gilford Rile - 2 wheels      Prior Function Level of Independence: Independent               Hand Dominance        Extremity/Trunk Assessment   Upper Extremity Assessment Upper Extremity Assessment: Overall WFL for tasks assessed    Lower Extremity Assessment Lower Extremity Assessment: LLE deficits/detail LLE Deficits / Details: grossly 2+ to 3/5, AAROM WFL       Communication   Communication: No difficulties  Cognition Arousal/Alertness: Awake/alert Behavior During Therapy: WFL for tasks assessed/performed Overall Cognitive Status: Within Functional Limits for tasks assessed                                        General Comments      Exercises General Exercises - Lower Extremity Ankle  Circles/Pumps: AROM;Both;10 reps   Assessment/Plan    PT Assessment Patient needs continued PT services  PT Problem List Decreased strength;Decreased activity tolerance;Decreased balance;Decreased knowledge of use of DME;Decreased range of motion;Decreased mobility;Decreased safety awareness       PT Treatment Interventions DME instruction;Functional mobility training;Patient/family education;Gait training;Therapeutic activities;Stair training;Therapeutic exercise    PT Goals (Current goals can be found in the Care Plan section)  Acute Rehab PT Goals Patient Stated  Goal: home soon PT Goal Formulation: With patient Time For Goal Achievement: 01/21/19 Potential to Achieve Goals: Good    Frequency 7X/week   Barriers to discharge        Co-evaluation               AM-PAC PT "6 Clicks" Mobility  Outcome Measure Help needed turning from your back to your side while in a flat bed without using bedrails?: A Little Help needed moving from lying on your back to sitting on the side of a flat bed without using bedrails?: A Little Help needed moving to and from a bed to a chair (including a wheelchair)?: A Little Help needed standing up from a chair using your arms (e.g., wheelchair or bedside chair)?: A Little Help needed to walk in hospital room?: A Little Help needed climbing 3-5 steps with a railing? : A Little 6 Click Score: 18    End of Session Equipment Utilized During Treatment: Gait belt Activity Tolerance: Patient tolerated treatment well Patient left: in bed        Time: 1054-1130 PT Time Calculation (min) (ACUTE ONLY): 36 min   Charges:   PT Evaluation $PT Eval Low Complexity: 1 Low PT Treatments $Gait Training: 8-22 mins        Kenyon Ana, PT  Pager: [phone] Acute Rehab Dept [facility]): [phone]   01/07/2019   [facility] 01/07/2019, 11:36 AM

## 2019-01-07 NOTE — Progress Notes (Signed)
   01/07/19 1400  PT Visit Information  Last PT Received On 01/07/19--ready for d/c  Assistance Needed +1  History of Present Illness s/p L DA THA secondary to fall resulting in hip fx  Subjective Data  Patient Stated Goal home soon  Precautions  Precautions Fall  Restrictions  Weight Bearing Restrictions No  LLE Weight Bearing WBAT  Other Position/Activity Restrictions `  Pain Assessment  Pain Assessment 0-10  Pain Score 4  Pain Location left hip  Pain Descriptors / Indicators Sore  Pain Intervention(s) Limited activity within patient's tolerance;Monitored during session;Premedicated before session  Cognition  Arousal/Alertness Awake/alert  Behavior During Therapy WFL for tasks assessed/performed  Overall Cognitive Status Within Functional Limits for tasks assessed  Bed Mobility  Overal bed mobility Needs Assistance  Bed Mobility Supine to Sit  Supine to sit Supervision  General bed mobility comments for safety  Transfers  Overall transfer level Needs assistance  Equipment used Rolling walker (2 wheeled)  Transfers Sit to/from Stand  Sit to Stand Min guard  General transfer comment cues for hand placement  Ambulation/Gait  Ambulation/Gait assistance Min guard  Gait Distance (Feet) 150 Feet  Assistive device Rolling walker (2 wheeled)  Gait Pattern/deviations Step-to pattern;Step-through pattern  General Gait Details cues for sequence and Rw position  Stairs Yes  Stairs assistance Min guard;Min assist  Stair Management No rails;Step to pattern;Backwards;Forwards;With walker  Number of Stairs 5  General stair comments cues for sequence and technique; up/down 4 steps backwards with RW, up/down one step forwards with  RW  Total Joint Exercises  Quad Sets AROM;Both;10 reps  Short Arc Quad AROM;10 reps;Left  Heel Slides AAROM;Left;5 reps  Hip ABduction/ADduction AROM;Left;5 reps  General Exercises - Lower Extremity  Ankle Circles/Pumps AROM;Both;10 reps  PT - End of  Session  Equipment Utilized During Treatment Gait belt  Activity Tolerance Patient tolerated treatment well  Patient left in chair;with call bell/phone within reach   PT - Assessment/Plan  PT Plan Current plan remains appropriate  PT Visit Diagnosis Other abnormalities of gait and mobility (R26.89)  PT Frequency (ACUTE ONLY) 7X/week  Follow Up Recommendations Home health PT;Supervision - Intermittent  PT equipment None recommended by PT  AM-PAC PT "6 Clicks" Mobility Outcome Measure (Version 2)  Help needed turning from your back to your side while in a flat bed without using bedrails? 3  Help needed moving from lying on your back to sitting on the side of a flat bed without using bedrails? 3  Help needed moving to and from a bed to a chair (including a wheelchair)? 3  Help needed standing up from a chair using your arms (e.g., wheelchair or bedside chair)? 3  Help needed to walk in hospital room? 3  Help needed climbing 3-5 steps with a railing?  3  6 Click Score 18  Consider Recommendation of Discharge To: [facility] with [facility]  PT Goal Progression  Progress towards PT goals Progressing toward goals  Acute Rehab PT Goals  PT Goal Formulation With patient  Time For Goal Achievement 01/21/19  Potential to Achieve Goals Good  PT Time Calculation  PT Start Time (ACUTE ONLY) 1430  PT Stop Time (ACUTE ONLY) 1452  PT Time Calculation (min) (ACUTE ONLY) 22 min  PT General Charges  $$ ACUTE PT VISIT 1 Visit  PT Treatments  $Gait Training 23-37 mins

## 2019-01-07 NOTE — Progress Notes (Signed)
Initial Nutrition Assessment  DOCUMENTATION CODES:   Not applicable  INTERVENTION:    RD to encourage high protein options at meals  Continue Centrum MVI  NUTRITION DIAGNOSIS:   Increased nutrient needs related to post-op healing as evidenced by estimated needs.  GOAL:   Patient will meet greater than or equal to 90% of their needs  MONITOR:   PO intake, Supplement acceptance, Weight trends, Labs  REASON FOR ASSESSMENT:   Consult Hip fracture protocol  ASSESSMENT:   Patient with PMH significant for osteoporosis, HLD, stroke, GERN, depression, intracranial bleeding, IBS, memory loss, and arthritis. Admitted for left hip fracture after a mechanical fall.    1/26- left hip arthroplasty  Pt denies having loss in appetite PTA. States she consumes three meals with snacks daily. Meal completions charted as 75-100% for her last 4 meals. Discussed the importance of protein intake to aid in post-op healing. Pt does not wish to have supplementation. RD encouraged high protein food options at each meal.   Pt denies any unintentional wt loss PTA.  Records indicate pt weighed 148 lb 10/31/18 and 144 lb this admission. Nutrition-Focused physical exam completed.  Last BM charted as 1/17. Bowel regimen in place. Will monitor for results.   Medications reviewed and include: colace, centrum, NS @ 75 ml/hr Labs reviewed: CBG 93-159  NUTRITION - FOCUSED PHYSICAL EXAM:    Most Recent Value  Orbital Region  No depletion  Upper Arm Region  No depletion  Thoracic and Lumbar Region  Unable to assess  Buccal Region  No depletion  Temple Region  No depletion  Clavicle Bone Region  No depletion  Clavicle and Acromion Bone Region  No depletion  Scapular Bone Region  Unable to assess  Dorsal Hand  No depletion  Patellar Region  No depletion  Anterior Thigh Region  No depletion  Posterior Calf Region  No depletion  Edema (RD Assessment)  None     Diet Order:   Diet Order           Diet regular Room service appropriate? Yes; Fluid consistency: Thin  Diet effective now              EDUCATION NEEDS:   Education needs have been addressed  Skin:  Skin Assessment: Skin Integrity Issues: Skin Integrity Issues:: Incisions Incisions: left hip  Last BM:  1/17  Height:   Ht Readings from Last 1 Encounters:  01/05/19 5\' 4"  (1.626 m)    Weight:   Wt Readings from Last 1 Encounters:  01/05/19 65.3 kg    Ideal Body Weight:  54.5 kg  BMI:  Body mass index is 24.72 kg/m.  Estimated Nutritional Needs:   Kcal:  1650-1850 kcal  Protein:  80-95 grams  Fluid:  >/= 1.6 L/day    Mariana Single RD, LDN Clinical Nutrition Pager # - [phone]

## 2019-01-07 NOTE — Discharge Summary (Signed)
[facility]  Physician Discharge Summary   Patient ID: Heather Lindsey MRN: [id number] DOB/AGE: 1945-12-22 73 y.o.  Admit date: 01/05/2019 Discharge date: 01/07/2019  PCP: Prince Solian, MD  DISCHARGE DIAGNOSES:  Left hip fracture Anemia due to operative loss History of Raynauds   RECOMMENDATIONS FOR OUTPATIENT FOLLOW UP: 1. Follow up with Ortho 2. Home health ordered   DISCHARGE CONDITION: fair  Diet recommendation: As before  [facility] Weights   01/05/19 1618  Weight: 65.3 kg    INITIAL HISTORY: 73 y.o.femalewith medical history ofosteoporosis,Raynaud'ssyndrome, hyperlipidemia, stroke, GERD, depression, anxiety, intracranial bleeding, migraine headaches, IBS, memory loss, arthritis,who presented with left hip pain.  However she denies any falls or injuries.  She was found to have a left hip fracture.  Hospitalized for further management.  Seen by orthopedics.  Underwent surgery.  Consultants: Dr. Berenice Primas with orthopedics  Procedures: Arthroplasty anterior approach on 1/26   HOSPITAL COURSE:   Left hip fracture Seen by orthopedics.  Underwent total left hip arthroplasty on 1/26.  Patient remained stable.  Pain is adequately well controlled.  Muscle relaxants.  Patient has very good functional status at baseline.  She would prefer to go home. Seen by PT and cleared for discharge home. Seen by ortho as well. Aspirin for DVt prophylaxis. Keflex prescribed by ortho.  Anemia likely due to operative blood loss Mild drop in hemoglobin is likely due to operative blood loss.  Anemia panel reviewed.  Ferritin 112.  Folate 9.0.  B12 754.  Iron noted to be 13.  TIBC 231.  Depression and anxiety Stable.  Continue home medications.  History of morphea Stable.  Methotrexate on hold currently.  Noted to be on celecoxib.  Patient recently has been on prednisone taper.  So she was given dexamethasone presumably for stress dosing. May resume her home  medications.  History of Raynauds phenomenon without gangrene Continue amlodipine.  History of GERD Continue Protonix.  Hyponatremia Sodium level has improved.  Was probably low due to hypovolemia.  Cough Chest x-ray did not show any infiltrates.  Cough is improving.  Overall stable. [place] for discharge home today.    PERTINENT LABS:  The results of significant diagnostics from this hospitalization (including imaging, microbiology, ancillary and laboratory) are listed below for reference.    Microbiology: Recent Results (from the past 240 hour(s))  Surgical pcr screen     Status: Abnormal   Collection Time: 01/06/19 12:29 AM  Result Value Ref Range Status   MRSA, PCR NEGATIVE NEGATIVE Final   Staphylococcus aureus POSITIVE (A) NEGATIVE Final    Comment: (NOTE) The Xpert SA Assay (FDA approved for NASAL specimens in patients 37 years of age and older), is one component of a comprehensive surveillance program. It is not intended to diagnose infection nor to guide or monitor treatment. Performed at [facility], [place] [address] [place], [place] [address]      Labs: Basic Metabolic Panel: Recent Labs  Lab 01/05/19 1903 01/06/19 0404 01/07/19 0336  NA 129* 139 139  K 3.9 3.8 3.8  CL 98 107 108  CO2 22 25 24   GLUCOSE 143* 93 159*  BUN 23 16 13   CREATININE 0.68 0.60 0.67  CALCIUM 8.9 8.5* 8.4*   CBC: Recent Labs  Lab 01/05/19 1903 01/06/19 0404 01/07/19 0336  WBC 9.8 7.7 10.9*  NEUTROABS 8.8*  --   --   HGB 11.9* 11.1* 10.2*  HCT 37.3 34.2* 32.1*  MCV 94.4 94.0 94.7  PLT 269 251 219  IMAGING STUDIES Dg Lumbar Spine Complete  Result Date: 01/05/2019 CLINICAL DATA:  Low back pain for 2 days, no known injury, initial encounter EXAM: LUMBAR SPINE - COMPLETE 4+ VIEW COMPARISON:  None. FINDINGS: Five lumbar type vertebral bodies are well visualized. Vertebral body height is well maintained. Facet hypertrophic changes are seen.  Degenerative anterolisthesis of L5 on S1 is noted likely of a chronic nature. No other focal abnormality is seen. IMPRESSION: Degenerative change with anterolisthesis of L5 on S1. Electronically Signed   By: Inez Catalina M.D.   On: 01/05/2019 18:55   Dg Chest Port 1 View  Result Date: 01/06/2019 CLINICAL DATA:  Cough. Preop. EXAM: PORTABLE CHEST 1 VIEW COMPARISON:  None. FINDINGS: Low lung volumes. Mild patient rotation. Streaky bibasilar atelectasis. Heart is normal in size. Normal mediastinal contours for degree of rotation. No pneumothorax or confluent airspace disease. No pulmonary edema. No large pleural effusion. No acute osseous abnormalities are seen. IMPRESSION: Low lung volumes with streaky bibasilar atelectasis. Electronically Signed   By: Keith Rake M.D.   On: 01/06/2019 00:46   Dg C-arm 1-60 Min-no Report  Result Date: 01/06/2019 Fluoroscopy was utilized by the requesting physician.  No radiographic interpretation.   Dg Hip Operative Unilat W Or W/o Pelvis Left  Result Date: 01/06/2019 CLINICAL DATA:  Left total hip replacement status post fracture EXAM: OPERATIVE left HIP (WITH PELVIS IF PERFORMED) left VIEWS TECHNIQUE: Fluoroscopic spot image(s) were submitted for interpretation post-operatively. COMPARISON:  01/05/2019 FINDINGS: Portable intraoperative radiographs of the show removal of fractured left hip with placement a left hip arthroplasty device. The hardware components are in anatomic alignment and there are no complications identified. IMPRESSION: 1. Status post left hip arthroplasty for left femoral neck fracture. Hardware components are in anatomic alignment without complications. Electronically Signed   By: Kerby Moors M.D.   On: 01/06/2019 10:39   Dg Hip Unilat W Or Wo Pelvis 2-3 Views Left  Result Date: 01/05/2019 CLINICAL DATA:  Left hip pain, no known injury EXAM: DG HIP (WITH OR WITHOUT PELVIS) 3V LEFT COMPARISON:  None. FINDINGS: There is an oblique fracture  through the left femoral neck at the junction of the femoral neck and intratrochanteric region. Impaction and angulation is noted at the fracture site. No other fractures are seen. IMPRESSION: Left femoral neck fracture inferiorly Electronically Signed   By: Inez Catalina M.D.   On: 01/05/2019 18:56    DISCHARGE EXAMINATION: See PN from earlier today.  DISPOSITION: Home  Discharge Instructions    Call MD for:  difficulty breathing, headache or visual disturbances   Complete by:  As directed    Call MD for:  extreme fatigue   Complete by:  As directed    Call MD for:  persistant dizziness or light-headedness   Complete by:  As directed    Call MD for:  persistant nausea and vomiting   Complete by:  As directed    Call MD for:  severe uncontrolled pain   Complete by:  As directed    Call MD for:  temperature >100.4   Complete by:  As directed    Diet - low sodium heart healthy   Complete by:  As directed    Discharge instructions   Complete by:  As directed    Please take your medications as prescribed.  You were cared for by a hospitalist during your hospital stay. If you have any questions about your discharge medications or the care you received while you were in  the hospital after you are discharged, you can call the unit and asked to speak with the hospitalist on call if the hospitalist that took care of you is not available. Once you are discharged, your primary care physician will handle any further medical issues. Please note that NO REFILLS for any discharge medications will be authorized once you are discharged, as it is imperative that you return to your primary care physician (or establish a relationship with a primary care physician if you do not have one) for your aftercare needs so that they can reassess your need for medications and monitor your lab values. If you do not have a primary care physician, you can call [phone] for a physician referral.   Face-to-face encounter  (required for Medicare/Medicaid patients)   Complete by:  As directed    Lilly certify that this patient is under my care and that I, or a nurse practitioner or physician's assistant working with me, had a face-to-face encounter that meets the physician face-to-face encounter requirements with this patient on 01/06/2019. The encounter with the patient was in whole, or in part for the following medical condition(s) which is the primary reason for home health care (List medical condition): s/p left total hip replacement   The encounter with the patient was in whole, or in part, for the following medical condition, which is the primary reason for home health care:  s/p left total hip replacement   I certify that, based on my findings, the following services are medically necessary home health services:  Physical therapy   Reason for Medically Necessary Home Health Services:  Therapy- Personnel officer, Public librarian   My clinical findings support the need for the above services:  Unable to leave home safely without assistance and/or assistive device   Further, I certify that my clinical findings support that this patient is homebound due to:  Pain interferes with ambulation/mobility   Home Health   Complete by:  As directed    To provide the following care/treatments:  PT   Increase activity slowly   Complete by:  As directed    Weight bearing as tolerated   Complete by:  As directed    Laterality:  left   Extremity:  Lower        Allergies as of 01/07/2019   No Known Allergies     Medication List    STOP taking these medications   traMADol 50 MG tablet Commonly known as:  ULTRAM     TAKE these medications   ALPRAZolam 0.5 MG tablet Commonly known as:  XANAX Take 0.25 mg by mouth daily as needed for anxiety or sleep.   amLODipine 5 MG tablet Commonly known as:  NORVASC Once a day po, watch for puffy feet ! What changed:    how much to take  how  to take this  when to take this  additional instructions   aspirin EC 325 MG tablet Take 1 tablet (325 mg total) by mouth 2 (two) times daily after a meal. Take x 1 month post op to decrease risk of blood clots.   baclofen 10 MG tablet Commonly known as:  LIORESAL TAKE 1 TABLET BY MOUTH AT  8AM AND 2PM AS NEEDED What changed:    how much to take  how to take this  when to take this   calcium carbonate 500 MG chewable tablet Commonly known as:  TUMS - dosed in mg elemental calcium Chew  1 tablet by mouth 2 (two) times daily as needed for indigestion or heartburn.   Calcium Citrate-Vitamin D 500-250 MG-UNIT Pack Take 1 capsule by mouth daily.   celecoxib 200 MG capsule Commonly known as:  CELEBREX TAKE 1 CAPSULE BY MOUTH  DAILY AS NEEDED What changed:  when to take this   CENTRUM PO Take 1 capsule by mouth daily.   cephALEXin 500 MG capsule Commonly known as:  KEFLEX Take 1 capsule (500 mg total) by mouth 2 (two) times daily for 10 doses.   docusate sodium 100 MG capsule Commonly known as:  COLACE Take 1 capsule (100 mg total) by mouth 2 (two) times daily.   fluticasone 50 MCG/ACT nasal spray Commonly known as:  FLONASE Place 1 spray into both nostrils daily as needed for allergies or rhinitis.   folic acid 1 MG tablet Commonly known as:  FOLVITE Take 1 mg by mouth daily.   HYDROcodone-acetaminophen 5-325 MG tablet Commonly known as:  NORCO Take 1-2 tablets by mouth every 6 (six) hours as needed for moderate pain.   LEVSIN 0.125 MG tablet Generic drug:  hyoscyamine Take 0.125 mg by mouth every 4 (four) hours as needed for bladder spasms or cramping.   loratadine 10 MG tablet Commonly known as:  CLARITIN Take 10 mg by mouth daily as needed for allergies.   methotrexate 7.5 MG tablet Commonly known as:  RHEUMATREX Take 7.5 mg by mouth every Sunday. Caution" Chemotherapy. Protect from light.   pantoprazole 40 MG tablet Commonly known as:  PROTONIX Take 1  tablet (40 mg total) by mouth daily.   polyethylene glycol packet Commonly known as:  MIRALAX / GLYCOLAX Take 17 g by mouth daily as needed for mild constipation.   predniSONE 10 MG tablet Commonly known as:  DELTASONE Take 10 mg by mouth See admin instructions. 12 day taper prednisone dose pack   RECLAST IV Inject 1 each into the vein once.   tiZANidine 2 MG tablet Commonly known as:  ZANAFLEX Take 1 tablet (2 mg total) by mouth every 8 (eight) hours as needed for muscle spasms.   TYLENOL 500 MG tablet Generic drug:  acetaminophen Take 1,000 mg by mouth every 6 (six) hours as needed for mild pain, moderate pain or headache.   venlafaxine 75 MG tablet Commonly known as:  EFFEXOR Take 75 mg by mouth daily after breakfast.   Vitamin D 50 MCG (2000 UT) Caps Take 1 capsule by mouth See admin instructions. 3 time a week            Discharge Care Instructions  (From admission, onward)         Start     Ordered   01/06/19 0000  Weight bearing as tolerated    Question Answer Comment  Laterality left   Extremity Lower      01 /26/20 0941           Follow-up Information    Dorna Leitz, MD. Schedule an appointment as soon as possible for a visit in 2 weeks.   Specialty:  Orthopedic Surgery Contact information: [place] [address] [phone]           TOTAL DISCHARGE TIME: 14 mins  [facility] Pager on [email or web address]  01/07/2019, 2:51 PM

## 2019-02-03 ENCOUNTER — Other Ambulatory Visit: Payer: Self-pay | Admitting: Rheumatology

## 2019-02-04 NOTE — Telephone Encounter (Addendum)
Last Visit: 10/31/18 Next Visit: 04/30/18 Labs: 01/07/19 Glucose 159, Calcium 8.4, WBC 10.9, RBC 3.39, HGB 10.2, Hct 32.1  Okay to refill per Dr. Estanislado Pandy

## 2019-03-25 ENCOUNTER — Telehealth: Payer: Self-pay

## 2019-03-25 MED ORDER — BACLOFEN 10 MG PO TABS: ORAL_TABLET | ORAL | 0 refills | Status: DC

## 2019-03-25 NOTE — Telephone Encounter (Signed)
Received refill request via fax.   Last Visit: 10/31/2018 Next Visit: 05/01/2019  Okay to refill per Dr. Estanislado Pandy.

## 2019-04-24 NOTE — Progress Notes (Signed)
Office Visit Note  Patient: Heather Lindsey             Date of Birth: 03/06/1946           MRN: [id number]             PCP: Prince Solian, MD Referring: Prince Solian, MD Visit Date: 05/01/2019 Occupation: @GUAROCC @  Subjective:  No chief complaint on file.  History of Present Illness: Heather Lindsey is a 73 y.o. female with history of morphea and osteoarthritis and osteoporosis.  Patient states she had emergent left total hip replacement in January 2020 by Dr. Berenice Primas due to sudden deterioration of her left femoral neck.  She continues to have discomfort in her hands especially her CMC joints.  She has been experiencing some discomfort in her left knee joint since her left total hip replacement.  She denies any joint swelling.  She was taking methotrexate for morphea.  She states she stopped methotrexate before the hip replacement.  She decided not to go back on methotrexate after the pandemic.  She plans to restart it after the pandemic subside.  She continues to have some lower back pain and muscle spasm.  She states it responds to Tylenol.  Activities of Daily Living:  Patient reports morning stiffness for 30 minutes.   Patient Denies nocturnal pain.  Difficulty dressing/grooming: Denies Difficulty climbing stairs: Denies Difficulty getting out of chair: Denies Difficulty using hands for taps, buttons, cutlery, and/or writing: Denies  Review of Systems  Constitutional: Positive for fatigue. Negative for night sweats, weight gain and weight loss.  HENT: Positive for mouth dryness. Negative for mouth sores, trouble swallowing, trouble swallowing and nose dryness.   Eyes: Positive for dryness. Negative for pain, redness and visual disturbance.  Respiratory: Negative for cough, shortness of breath and difficulty breathing.   Cardiovascular: Negative for chest pain, palpitations, hypertension, irregular heartbeat and swelling in legs/feet.  Gastrointestinal: Negative for blood in stool,  constipation and diarrhea.  Endocrine: Negative for increased urination.  Genitourinary: Negative for vaginal dryness.  Musculoskeletal: Positive for arthralgias, joint pain and morning stiffness. Negative for joint swelling, myalgias, muscle weakness, muscle tenderness and myalgias.  Skin: Positive for rash. Negative for color change, hair loss, skin tightness, ulcers and sensitivity to sunlight.  Allergic/Immunologic: Negative for susceptible to infections.  Neurological: Negative for dizziness, memory loss, night sweats and weakness.  Hematological: Negative for swollen glands.  Psychiatric/Behavioral: Positive for depressed mood and sleep disturbance. The patient is nervous/anxious.        Situational    PMFS History:  Patient Active Problem List   Diagnosis Date Noted  . Cough 01/06/2019  . Anxiety 01/05/2019  . Left displaced femoral neck fracture ([place]) 01/05/2019  . Hyponatremia 01/05/2019  . Raynaud's phenomenon without gangrene 10/25/2016  . Trochanteric bursitis of both hips 10/25/2016  . Gastroesophageal reflux disease  10/25/2016  . Former smoker, 10/25/2016  . Intracranial hemorrhage, spontaneous subarachnoid, idiopathic, chronic ([place]) 09/02/2015  . 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 HYPERCHOLESTEROLEMIA 11/18/2008  . Depression 11/18/2008  . IRRITABLE BOWEL SYNDROME 11/18/2008  . Morphea 11/18/2008  . Primary osteoarthritis of both hands 11/18/2008  . Age related osteoporosis 11/18/2008  . MIGRAINES, HX OF 11/18/2008    Past Medical History:  Diagnosis Date  . Anxiety   . 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 Brain bleed ([place])   . Circumscribed scleroderma   . Depression   . Diverticulosis   .  GERD (gastroesophageal reflux disease)   . Hyperlipidemia   . IBS (irritable bowel syndrome)   . Memory loss   . Migraines   . Osteoarthritis   . Osteoporosis   . Stroke [facility]) 2015    Family History   Problem Relation Age of Onset  . Heart disease Father   . Liver cancer Paternal Aunt   . Prostate cancer Paternal Grandfather   . Colon cancer Neg Hx   . Stomach cancer Neg Hx    Past Surgical History:  Procedure Laterality Date  . broken ankle Left   . TOTAL HIP ARTHROPLASTY Left 01/06/2019   Procedure: TOTAL HIP ARTHROPLASTY ANTERIOR APPROACH;  Surgeon: Dorna Leitz, MD;  Location: [facility];  Service: Orthopedics;  Laterality: Left;  . TUBAL LIGATION     Social History   Social History Narrative   Daily caffeine, 2 glasses daily,   Right handed, Married, 2 sons. Retired. College.    There is no immunization history on file for this patient.   Objective: Vital Signs: BP 100/69 (BP Location: Right Arm, Patient Position: Sitting, Cuff Size: Normal)   Pulse 80   Resp 12   Ht 5\' 4"  (1.626 m)   Wt 145 lb 12.8 oz (66.1 kg)   BMI 25.03 kg/m    Physical Exam Vitals signs and nursing note reviewed.  Constitutional:      Appearance: She is well-developed.  HENT:     Head: Normocephalic and atraumatic.  Eyes:     Conjunctiva/sclera: Conjunctivae normal.  Neck:     Musculoskeletal: Normal range of motion.  Cardiovascular:     Rate and Rhythm: Normal rate and regular rhythm.     Heart sounds: Normal heart sounds.  Pulmonary:     Effort: Pulmonary effort is normal.     Breath sounds: Normal breath sounds.  Abdominal:     General: Bowel sounds are normal.     Palpations: Abdomen is soft.  Lymphadenopathy:     Cervical: No cervical adenopathy.  Skin:    General: Skin is warm and dry.     Capillary Refill: Capillary refill takes less than 2 seconds.     Comments: She has extensive morphea lesions on her trunk.  Morphea lesions on her lower extremities have improved.  She has few new lesions on her forearm.  Neurological:     Mental Status: She is alert and oriented to person, place, and time.  Psychiatric:        Behavior: Behavior normal.      Musculoskeletal Exam:  C-spine thoracic and lumbar spine good range of motion.  Shoulder joints elbow joints wrist joint MCPs PIPs DIPs been good range of motion.  She has bilateral CMC DIP and PIP thickening consistent with osteoarthritis.  Left hip joint is replaced which had some limitation of motion.  Right hip joint was in good range of motion.  Bilateral knee joints with good range of motion without any warmth swelling or effusion.  CDAI Exam: CDAI Score: Not documented Patient Global Assessment: Not documented; Provider Global Assessment: Not documented Swollen: Not documented; Tender: Not documented Joint Exam   Not documented   There is currently no information documented on the homunculus. Go to the Rheumatology activity and complete the homunculus joint exam.  Investigation: No additional findings.  Imaging: No results found.  Recent Labs: Lab Results  Component Value Date   WBC 10.9 (H) 01/07/2019   HGB 10.2 (L) 01/07/2019   PLT 219 01/07/2019   NA 139  01/07/2019   K 3.8 01/07/2019   CL 108 01/07/2019   CO2 24 01/07/2019   GLUCOSE 159 (H) 01/07/2019   BUN 13 01/07/2019   CREATININE 0.67 01/07/2019   CALCIUM 8.4 (L) 01/07/2019   GFRAA >60 01/07/2019    Speciality Comments: No specialty comments available.  Procedures:  No procedures performed Allergies: Patient has no known allergies.   Assessment / Plan:     Visit Diagnoses: Morphea-she has been followed at [facility].  She was treated with methotrexate which she stopped prior to her hip replacement and wants to hold off during the time of pandemic.  High risk medication use - MTX 10 tabs po q wk, Folic acid rxd by Dr. Sharol Roussel.  Patient states that she had labs in March by her PCP.  I do not have those available to review.  Raynaud's phenomenon without gangrene-the symptoms have improved.  Status post total hip replacement, left - January 06, 2019 by Dr. Berenice Primas.  Patient states it was a urgent replacement due to  deterioration of her left hip joint.  Primary osteoarthritis of both hands-she has a stiffness and discomfort in her hands.  Age-related osteoporosis without current pathological fracture - Status post Forteo.  She is currently on IV Reclast prescribed by Dr. Dagmar Hait.  Patient states she will get second opinion from Dr. Forde Dandy which she will discuss with Dr. Dagmar Hait.  Chronic midline low back pain without sciatica-she takes Tylenol PRN for muscle spasms.  Other medical problems are listed as follows:  History of migraine  History of gastroesophageal reflux (GERD)  History of hypercholesterolemia  History of IBS  History of depression -patient states increased depression and anxiety which is situational due to pandemic.  Orders: No orders of the defined types were placed in this encounter.  No orders of the defined types were placed in this encounter.   Face-to-face time spent with patient was 30 minutes. Greater than 50% of time was spent in counseling and coordination of care.  Follow-Up Instructions: Return in about 6 months (around 11/01/2019) for Morphea, OA.   Bo Merino, MD  Note - This record has been created using [employer].  Chart creation errors have been sought, but may not always  have been located. Such creation errors do not reflect on  the standard of medical care.

## 2019-05-01 ENCOUNTER — Other Ambulatory Visit: Payer: Self-pay

## 2019-05-01 ENCOUNTER — Ambulatory Visit: Payer: Medicare Other | Admitting: Rheumatology

## 2019-05-01 ENCOUNTER — Encounter: Payer: Self-pay | Admitting: Rheumatology

## 2019-05-01 VITALS — BP 100/69 | HR 80 | Resp 12 | Ht 64.0 in | Wt 145.8 lb

## 2019-05-01 DIAGNOSIS — I7300 Raynaud's syndrome without gangrene: Secondary | ICD-10-CM | POA: Diagnosis not present

## 2019-05-01 DIAGNOSIS — M19041 Primary osteoarthritis, right hand: Secondary | ICD-10-CM

## 2019-05-01 DIAGNOSIS — L940 Localized scleroderma [morphea]: Secondary | ICD-10-CM | POA: Diagnosis not present

## 2019-05-01 DIAGNOSIS — Z79899 Other long term (current) drug therapy: Secondary | ICD-10-CM | POA: Diagnosis not present

## 2019-05-01 DIAGNOSIS — M545 Low back pain, unspecified: Secondary | ICD-10-CM

## 2019-05-01 DIAGNOSIS — G8929 Other chronic pain: Secondary | ICD-10-CM

## 2019-05-01 DIAGNOSIS — Z8659 Personal history of other mental and behavioral disorders: Secondary | ICD-10-CM

## 2019-05-01 DIAGNOSIS — Z96642 Presence of left artificial hip joint: Secondary | ICD-10-CM

## 2019-05-01 DIAGNOSIS — Z8639 Personal history of other endocrine, nutritional and metabolic disease: Secondary | ICD-10-CM

## 2019-05-01 DIAGNOSIS — Z8719 Personal history of other diseases of the digestive system: Secondary | ICD-10-CM

## 2019-05-01 DIAGNOSIS — M19042 Primary osteoarthritis, left hand: Secondary | ICD-10-CM

## 2019-05-01 DIAGNOSIS — Z8669 Personal history of other diseases of the nervous system and sense organs: Secondary | ICD-10-CM

## 2019-05-01 DIAGNOSIS — M810 Age-related osteoporosis without current pathological fracture: Secondary | ICD-10-CM

## 2019-07-29 ENCOUNTER — Other Ambulatory Visit: Payer: Self-pay | Admitting: Rheumatology

## 2019-07-29 ENCOUNTER — Telehealth: Payer: Self-pay | Admitting: Rheumatology

## 2019-07-29 NOTE — Telephone Encounter (Signed)
Noted  

## 2019-07-29 NOTE — Telephone Encounter (Signed)
She is due to update lab work.

## 2019-07-29 NOTE — Telephone Encounter (Signed)
Patient called stating she is out of town and will do labwork at her appointment in November.  Patient states she does not need a refill on her Celebrex and has enough capsules to last until her appointment.

## 2019-07-29 NOTE — Telephone Encounter (Signed)
Last Visit: 05/01/19 Next Visit: 10/30/19 Labs: 01/07/19 CBC/BMP Glucose 159 Calcium 8.4 WBC 10.9 RBC 3.39 Hgb 10.2 Hct 32.1  Okay to refill Celebrex?

## 2019-07-29 NOTE — Telephone Encounter (Signed)
Attempted to contact patient and left message to advise patient she is due to update labs.

## 2019-10-16 NOTE — Progress Notes (Signed)
Office Visit Note  Patient: Heather Lindsey             Date of Birth: June 05, 1946           MRN: [id number]             PCP: Prince Solian, MD Referring: Prince Solian, MD Visit Date: 10/30/2019 Occupation: @GUAROCC @  Subjective:  Medication monitoring   History of Present Illness: Heather Lindsey is a 73 y.o. female with history of morphea and osteoarthritis.  She continues to have a rash on torso.  She is followed by Dr. Sharol Roussel. She has been holding MTX since prior to her left hip replacement on 01/05/19 performed by Dr. Maryland Pink.  She was concerned about restarting on MTX due to the covid-19 pandemic.   She continues to have chronic lower back pain.  She takes baclofen and 2 extra strength tylenols several days per week for relief.  She continues to take celebrex 200 mg 1 capsule daily for pain relief. She does not think celebrex or baclofen have been effective.  She states she had an updated DEXA on 04/04/19, and she will be starting on Prolia tomorrow.  She was previously receiving reclast IV infusions.    Activities of Daily Living:  Patient reports joint stiffness all day Patient Reports nocturnal pain.  Difficulty dressing/grooming: Denies Difficulty climbing stairs: Denies Difficulty getting out of chair: Denies Difficulty using hands for taps, buttons, cutlery, and/or writing: Reports  Review of Systems  Constitutional: Negative for fatigue.  HENT: Positive for mouth dryness. Negative for mouth sores and nose dryness.   Eyes: Negative for itching and dryness.  Respiratory: Negative for shortness of breath, wheezing and difficulty breathing.   Cardiovascular: Negative for chest pain and palpitations.  Gastrointestinal: Negative for blood in stool, constipation and diarrhea.  Endocrine: Negative for increased urination.  Genitourinary: Negative for difficulty urinating.  Musculoskeletal: Positive for arthralgias, joint pain, joint swelling and morning stiffness.  Skin:  Negative for rash and hair loss.  Allergic/Immunologic: Negative for susceptible to infections.  Neurological: Negative for dizziness, numbness, headaches, memory loss and weakness.  Hematological: Positive for bruising/bleeding tendency.  Psychiatric/Behavioral: Negative for confusion.    PMFS History:  Patient Active Problem List   Diagnosis Date Noted  . Cough 01/06/2019  . Anxiety 01/05/2019  . Left displaced femoral neck fracture ([place]) 01/05/2019  . Hyponatremia 01/05/2019  . Raynaud's phenomenon without gangrene 10/25/2016  . Trochanteric bursitis of both hips 10/25/2016  . Gastroesophageal reflux disease  10/25/2016  . Former smoker, 10/25/2016  . 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 HYPERCHOLESTEROLEMIA 11/18/2008  . Depression 11/18/2008  . IRRITABLE BOWEL SYNDROME 11/18/2008  . Morphea 11/18/2008  . Primary osteoarthritis of both hands 11/18/2008  . Age related osteoporosis 11/18/2008  . MIGRAINES, HX OF 11/18/2008    Past Medical History:  Diagnosis Date  . Anxiety   . 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 Brain bleed ([place])   . Circumscribed scleroderma   . Depression   . Diverticulosis   . GERD (gastroesophageal reflux disease)   . Hyperlipidemia   . IBS (irritable bowel syndrome)   . Memory loss   . Migraines   . Osteoarthritis   . Osteoporosis   . Stroke [facility]) 2015    Family History  Problem Relation Age of Onset  . Heart disease Father   . Liver cancer Paternal  Aunt   . Prostate cancer Paternal Grandfather   . Colon cancer Neg Hx   . Stomach cancer Neg Hx    Past Surgical History:  Procedure Laterality Date  . broken ankle Left   . TOTAL HIP ARTHROPLASTY Left 01/06/2019   Procedure: TOTAL HIP ARTHROPLASTY ANTERIOR APPROACH;  Surgeon: Dorna Leitz, MD;  Location: [facility];  Service: Orthopedics;   Laterality: Left;  . TUBAL LIGATION     Social History   Social History Narrative   Daily caffeine, 2 glasses daily,   Right handed, Married, 2 sons. Retired. College.    There is no immunization history on file for this patient.   Objective: Vital Signs: BP 135/88 (BP Location: Right Arm, Patient Position: Sitting, Cuff Size: Normal)   Pulse 78   Resp 12   Ht 5\' 3"  (1.6 m)   Wt 149 lb (67.6 kg)   BMI 26.39 kg/m    Physical Exam Vitals signs and nursing note reviewed.  Constitutional:      Appearance: She is well-developed.  HENT:     Head: Normocephalic and atraumatic.  Eyes:     Conjunctiva/sclera: Conjunctivae normal.  Neck:     Musculoskeletal: Normal range of motion.  Cardiovascular:     Rate and Rhythm: Normal rate and regular rhythm.     Heart sounds: Normal heart sounds.  Pulmonary:     Effort: Pulmonary effort is normal.     Breath sounds: Normal breath sounds.  Abdominal:     General: Bowel sounds are normal.     Palpations: Abdomen is soft.  Lymphadenopathy:     Cervical: No cervical adenopathy.  Skin:    General: Skin is warm and dry.     Capillary Refill: Capillary refill takes less than 2 seconds.     Comments: Extensive morphea lesions on her trunk.   Neurological:     Mental Status: She is alert and oriented to person, place, and time.  Psychiatric:        Behavior: Behavior normal.      Musculoskeletal Exam: C-spine, thoracic spine, and lumbar spine good ROM.  Shoulder joints, elbow joints, wrist joints, and MCP joints good ROM with no synovitis.  Left 5th PIP joint limited extension.  PIP and DIP synovial thickening consistent with osteoarthritis of both hands. Left hip replacement good ROM.  Right hip good ROM with no discomfort.  Knee joints good ROM with no discomfort.  No warmth or effusion of knee joints.  No tenderness or swelling of ankle joints.   CDAI Exam: CDAI Score: - Patient Global: -; Provider Global: - Swollen: -; Tender: -  Joint Exam   No joint exam has been documented for this visit   There is currently no information documented on the homunculus. Go to the Rheumatology activity and complete the homunculus joint exam.  Investigation: No additional findings.  Imaging: Xr Hand 2 View Left  Result Date: 10/30/2019 CMC, PIP and DIP narrowing was noted.  No MCP narrowing was noted.  No intercarpal radiocarpal joint space narrowing was noted.  The fifth MCP joint appears to be subluxed. Impression: These findings are consistent with osteoarthritis and subluxation of the fifth MCP joint.   Recent Labs: Lab Results  Component Value Date   WBC 10.9 (H) 01/07/2019   HGB 10.2 (L) 01/07/2019   PLT 219 01/07/2019   NA 139 01/07/2019   K 3.8 01/07/2019   CL 108 01/07/2019   CO2 24 01/07/2019   GLUCOSE 159 (H) 01/07/2019  BUN 13 01/07/2019   CREATININE 0.67 01/07/2019   CALCIUM 8.4 (L) 01/07/2019   GFRAA >60 01/07/2019    Speciality Comments: No specialty comments available.  Procedures:  No procedures performed Allergies: Patient has no known allergies.   Assessment / Plan:     Visit Diagnoses: Morphea: She has extensive morphea on her torso.  She has followed by Dr. Sharol Roussel at [facility].  She was previously taking methotrexate 10 tablets by mouth once weekly and folic acid 2 mg by mouth daily but discontinued prior to her left hip replacement in January 2020.  She has not restarted on methotrexate due to the concern for COVID-19.  She does not want to restart at this time.  High risk medication use -She is holding MTX 10 tabs po q wk, Folic acid rxd by Dr. Sharol Roussel due to the concern for covid-19.  09/09/19: Creatinine/GFR and LFTs WNL   Raynaud's phenomenon without gangrene: She has intermittent symptoms of Raynaud's.  No digital ulcerations or signs of gangrene.  Status post total hip replacement, left: Doing well.  Performed by Dr. [place] Pink on 01/05/19. Good range of motion with no  discomfort.   Primary osteoarthritis of both hands: She has PIP and DIP synovial thickening consistent with osteoarthritis of both hands.  She fell memorial weekend and landed on her left hand continues have discomfort in the left fifth PIP joint.  She has limited extension of the left fifth PIP joint. x-rays of left hand were obtained today.  Pain in left hand -She presents today with pain in the left fifth PIP joint.  She has limited extension of the left PIP with tenderness.  No synovitis was noted.  She fell memorial weekend has had discomfort since then.  X-rays of the left hand were obtained today. Subluxation of the 5th MCP joint noted. We discussed referring her to a hand surgeon but she declined.  She was advised notify us if she changes her mind.  We discussed the importance of stretching the tendon to try to get full extension back. Plan: XR Hand 2 View Left  Age-related osteoporosis without current pathological fracture: DEXA on 04/04/19 right femoral neck T-score -2.6 with BMD 0.558.  She will be starting on Prolia tomorrow.  She was previously on Reclast infusions.   Other medical conditions are listed as follows:   History of migraine  History of gastroesophageal reflux (GERD)  History of hypercholesterolemia  History of IBS  History of depression    Orders: Orders Placed This Encounter  Procedures  . XR Hand 2 View Left   No orders of the defined types were placed in this encounter.   Face-to-face time spent with patient was 30 minutes. Greater than 50% of time was spent in counseling and coordination of care.  Follow-Up Instructions: Return in about 6 months (around 04/28/2020) for Morphea, Osteoporosis.   Ofilia Neas, PA-C   I examined and evaluated the patient with Hazel Sams PA.  Patient has been off methotrexate.  She was taking that for morphea.  She does not want restarted until the pandemic is over.  She had a previous injury to her left fifth MCP joint.   She appears to have some flexion contracture in that flexor tendon.  I obtained x-rays today which was unremarkable.  The joint appears to get subluxed.  I offered referral to orthopedic surgeon which she declined.  The plan of care was discussed as noted above.  Bo Merino, MD  Note -  This record has been created using [employer].  Chart creation errors have been sought, but may not always  have been located. Such creation errors do not reflect on  the standard of medical care.

## 2019-10-30 ENCOUNTER — Ambulatory Visit: Payer: Self-pay

## 2019-10-30 ENCOUNTER — Encounter: Payer: Self-pay | Admitting: Rheumatology

## 2019-10-30 ENCOUNTER — Ambulatory Visit: Payer: Medicare Other | Admitting: Rheumatology

## 2019-10-30 ENCOUNTER — Other Ambulatory Visit (HOSPITAL_COMMUNITY): Payer: Self-pay | Admitting: *Deleted

## 2019-10-30 ENCOUNTER — Other Ambulatory Visit: Payer: Self-pay

## 2019-10-30 VITALS — BP 135/88 | HR 78 | Resp 12 | Ht 63.0 in | Wt 149.0 lb

## 2019-10-30 DIAGNOSIS — Z8669 Personal history of other diseases of the nervous system and sense organs: Secondary | ICD-10-CM

## 2019-10-30 DIAGNOSIS — L940 Localized scleroderma [morphea]: Secondary | ICD-10-CM

## 2019-10-30 DIAGNOSIS — Z79899 Other long term (current) drug therapy: Secondary | ICD-10-CM

## 2019-10-30 DIAGNOSIS — Z8719 Personal history of other diseases of the digestive system: Secondary | ICD-10-CM

## 2019-10-30 DIAGNOSIS — Z96642 Presence of left artificial hip joint: Secondary | ICD-10-CM | POA: Diagnosis not present

## 2019-10-30 DIAGNOSIS — Z8659 Personal history of other mental and behavioral disorders: Secondary | ICD-10-CM

## 2019-10-30 DIAGNOSIS — Z8639 Personal history of other endocrine, nutritional and metabolic disease: Secondary | ICD-10-CM

## 2019-10-30 DIAGNOSIS — M810 Age-related osteoporosis without current pathological fracture: Secondary | ICD-10-CM

## 2019-10-30 DIAGNOSIS — M19041 Primary osteoarthritis, right hand: Secondary | ICD-10-CM | POA: Diagnosis not present

## 2019-10-30 DIAGNOSIS — M19042 Primary osteoarthritis, left hand: Secondary | ICD-10-CM

## 2019-10-30 DIAGNOSIS — I7300 Raynaud's syndrome without gangrene: Secondary | ICD-10-CM

## 2019-10-30 DIAGNOSIS — M79642 Pain in left hand: Secondary | ICD-10-CM

## 2019-10-31 ENCOUNTER — Other Ambulatory Visit: Payer: Self-pay

## 2019-10-31 ENCOUNTER — Ambulatory Visit (HOSPITAL_COMMUNITY)
Admission: RE | Admit: 2019-10-31 | Discharge: 2019-10-31 | Disposition: A | Discharge status: Home / Self Care | Payer: Medicare Other | Source: Ambulatory Visit | Attending: Internal Medicine | Admitting: Internal Medicine

## 2019-10-31 DIAGNOSIS — M810 Age-related osteoporosis without current pathological fracture: Secondary | ICD-10-CM | POA: Diagnosis present

## 2019-10-31 MED ORDER — DENOSUMAB 60 MG/ML ~~LOC~~ SOSY
PREFILLED_SYRINGE | SUBCUTANEOUS | Status: AC
  Filled 2019-10-31: qty 1

## 2019-10-31 MED ORDER — DENOSUMAB 60 MG/ML ~~LOC~~ SOSY
60.0000 mg | PREFILLED_SYRINGE | Freq: Once | SUBCUTANEOUS | Status: AC
  Administered 2019-10-31: 13:00:00 60 mg via SUBCUTANEOUS

## 2019-10-31 NOTE — Discharge Instructions (Signed)
Denosumab injection °What is this medicine? °DENOSUMAB (den oh sue mab) slows bone breakdown. Prolia is used to treat osteoporosis in women after menopause and in men, and in people who are taking corticosteroids for 6 months or more. Xgeva is used to treat a high calcium level due to cancer and to prevent bone fractures and other bone problems caused by multiple myeloma or cancer bone metastases. Xgeva is also used to treat giant cell tumor of the bone. °This medicine may be used for other purposes; ask your health care provider or pharmacist if you have questions. °COMMON BRAND NAME(S): Prolia, XGEVA °What should I tell my health care provider before I take this medicine? °They need to know if you have any of these conditions: °· dental disease °· having surgery or tooth extraction °· infection °· kidney disease °· low levels of calcium or Vitamin D in the blood °· malnutrition °· on hemodialysis °· skin conditions or sensitivity °· thyroid or parathyroid disease °· an unusual reaction to denosumab, other medicines, foods, dyes, or preservatives °· pregnant or trying to get pregnant °· breast-feeding °How should I use this medicine? °This medicine is for injection under the skin. It is given by a health care professional in a hospital or clinic setting. °A special MedGuide will be given to you before each treatment. Be sure to read this information carefully each time. °For Prolia, talk to your pediatrician regarding the use of this medicine in children. Special care may be needed. For Xgeva, talk to your pediatrician regarding the use of this medicine in children. While this drug may be prescribed for children as young as 13 years for selected conditions, precautions do apply. °Overdosage: If you think you have taken too much of this medicine contact a poison control center or emergency room at once. °NOTE: This medicine is only for you. Do not share this medicine with others. °What if I miss a dose? °It is  important not to miss your dose. Call your doctor or health care professional if you are unable to keep an appointment. °What may interact with this medicine? °Do not take this medicine with any of the following medications: °· other medicines containing denosumab °This medicine may also interact with the following medications: °· medicines that lower your chance of fighting infection °· steroid medicines like prednisone or cortisone °This list may not describe all possible interactions. Give your health care provider a list of all the medicines, herbs, non-prescription drugs, or dietary supplements you use. Also tell them if you smoke, drink alcohol, or use illegal drugs. Some items may interact with your medicine. °What should I watch for while using this medicine? °Visit your doctor or health care professional for regular checks on your progress. Your doctor or health care professional may order blood tests and other tests to see how you are doing. °Call your doctor or health care professional for advice if you get a fever, chills or sore throat, or other symptoms of a cold or flu. Do not treat yourself. This drug may decrease your body's ability to fight infection. Try to avoid being around people who are sick. °You should make sure you get enough calcium and vitamin D while you are taking this medicine, unless your doctor tells you not to. Discuss the foods you eat and the vitamins you take with your health care professional. °See your dentist regularly. Brush and floss your teeth as directed. Before you have any dental work done, tell your dentist you are   receiving this medicine. Do not become pregnant while taking this medicine or for 5 months after stopping it. Talk with your doctor or health care professional about your birth control options while taking this medicine. Women should inform their doctor if they wish to become pregnant or think they might be pregnant. There is a potential for serious side  effects to an unborn child. Talk to your health care professional or pharmacist for more information. What side effects may I notice from receiving this medicine? Side effects that you should report to your doctor or health care professional as soon as possible:  allergic reactions like skin rash, itching or hives, swelling of the face, lips, or tongue  bone pain  breathing problems  dizziness  jaw pain, especially after dental work  redness, blistering, peeling of the skin  signs and symptoms of infection like fever or chills; cough; sore throat; pain or trouble passing urine  signs of low calcium like fast heartbeat, muscle cramps or muscle pain; pain, tingling, numbness in the hands or feet; seizures  unusual bleeding or bruising  unusually weak or tired Side effects that usually do not require medical attention (report to your doctor or health care professional if they continue or are bothersome):  constipation  diarrhea  headache  joint pain  loss of appetite  muscle pain  runny nose  tiredness  upset stomach This list may not describe all possible side effects. Call your doctor for medical advice about side effects. You may report side effects to FDA at 1-800-FDA-1088. Where should I keep my medicine? This medicine is only given in a clinic, doctor's office, or other health care setting and will not be stored at home. NOTE: This sheet is a summary. It may not cover all possible information. If you have questions about this medicine, talk to your doctor, pharmacist, or health care provider.  2020 Elsevier/Gold Standard (2018-04-06 16:10:44)

## 2020-01-09 ENCOUNTER — Telehealth: Payer: Self-pay | Admitting: Rheumatology

## 2020-01-09 NOTE — Telephone Encounter (Signed)
I reviewed the patient's lab work from 09/05/19.  Creatinine and GFR WNL.  LFTs WNL.  She is not currently on MTX due to the concern for COVID-19.  It is ok for her to take celebrex 200 mg 1 tablet by mouth as needed for pain relief.  Please advise patient to try to take celebrex sparingly.

## 2020-01-09 NOTE — Telephone Encounter (Signed)
Patient states she was previously on Celebrex. Patient states she was advised to stop the Celebrex at her last office visit on 10/30/19. Patient states her hands hurt a lot and are stiff. Patient would like to know if you would reconsider and prescribe the Celebrex again. Patient states she has routinely been taking Extra strength Tylenol twice daily.  It does provide some relief. Please advise.

## 2020-01-09 NOTE — Telephone Encounter (Signed)
Patient left a voicemail stating she has been experiencing pain and stiffness in her hands ever since discontinuing her prescription of Celebrex.  Patient would like to restart the medication and is requesting a return call.

## 2020-01-10 MED ORDER — CELECOXIB 200 MG PO CAPS: 200.0000 mg | ORAL_CAPSULE | Freq: Every day | ORAL | 0 refills | Status: DC | PRN

## 2020-01-10 NOTE — Telephone Encounter (Signed)
Patient advised Heather Sams, PA-C reviewed the patient's lab work from 09/05/19.  Creatinine and GFR WNL.  LFTs WNL.  She is not currently on MTX due to the concern for COVID-19.  Patient advised it is ok for her to take celebrex 200 mg 1 tablet by mouth as needed for pain relief. Patient advised to try to take celebrex sparingly.

## 2020-02-09 ENCOUNTER — Other Ambulatory Visit: Payer: Self-pay | Admitting: Gastroenterology

## 2020-02-09 ENCOUNTER — Other Ambulatory Visit: Payer: Self-pay | Admitting: Rheumatology

## 2020-02-10 NOTE — Telephone Encounter (Signed)
Last Visit: 10/30/19 Next Visit: 04/29/20 Labs: 09/05/19 WNL  Okay to refill per Dr. Estanislado Pandy

## 2020-04-21 NOTE — Progress Notes (Signed)
Office Visit Note  Patient: Heather Lindsey             Date of Birth: 1946/05/29           MRN: [id number]             PCP: Prince Solian, MD Referring: Prince Solian, MD Visit Date: 04/29/2020 Occupation: @GUAROCC @  Subjective:  Other (right hip pain, Dr. Berenice Primas took xray in 12/2019)   History of Present Illness: Heather Lindsey is a 74 y.o. female with history of morphea and osteoarthritis.  She states she has been having discomfort in her right hip and especially in the morning.  She was seen by Dr. Berenice Primas in January and had x-ray which was unremarkable per patient.  She continues to have some discomfort in that area.  She has not noticed any change in her skin although it is difficult for her to check.  She states that she stopped methotrexate after left hip replacement and then did not restart due to the pandemic.  She has not had an appointment with the dermatologist.  She has been taking Prolia injections for osteoporosis by Dr. Forde Dandy.  Activities of Daily Living:  Patient reports morning stiffness for 30 minutes.   Patient Reports nocturnal pain.  Difficulty dressing/grooming: Denies Difficulty climbing stairs: Denies Difficulty getting out of chair: Denies Difficulty using hands for taps, buttons, cutlery, and/or writing: Reports  Review of Systems  Constitutional: Positive for fatigue. Negative for night sweats, weight gain and weight loss.  HENT: Positive for mouth dryness. Negative for mouth sores, trouble swallowing, trouble swallowing and nose dryness.   Eyes: Positive for dryness. Negative for pain, redness, itching and visual disturbance.  Respiratory: Negative for cough, shortness of breath and difficulty breathing.   Cardiovascular: Negative for chest pain, palpitations, hypertension, irregular heartbeat and swelling in legs/feet.  Gastrointestinal: Negative for blood in stool, constipation and diarrhea.  Endocrine: Negative for increased urination.  Genitourinary:  Negative for difficulty urinating and vaginal dryness.  Musculoskeletal: Positive for arthralgias, joint pain, myalgias, morning stiffness, muscle tenderness and myalgias. Negative for joint swelling and muscle weakness.  Skin: Positive for rash. Negative for color change, hair loss, redness, skin tightness, ulcers and sensitivity to sunlight.  Allergic/Immunologic: Negative for susceptible to infections.  Neurological: Negative for dizziness, numbness, headaches, memory loss, night sweats and weakness.  Hematological: Positive for bruising/bleeding tendency. Negative for swollen glands.  Psychiatric/Behavioral: Negative for depressed mood, confusion and sleep disturbance. The patient is not nervous/anxious.     PMFS History:  Patient Active Problem List   Diagnosis Date Noted  . Cough 01/06/2019  . Anxiety 01/05/2019  . Left displaced femoral neck fracture ([place]) 01/05/2019  . Hyponatremia 01/05/2019  . Raynaud's phenomenon without gangrene 10/25/2016  . Trochanteric bursitis of both hips 10/25/2016  . Gastroesophageal reflux disease  10/25/2016  . Former smoker, 10/25/2016  . 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 HYPERCHOLESTEROLEMIA 11/18/2008  . Depression 11/18/2008  . IRRITABLE BOWEL SYNDROME 11/18/2008  . Morphea 11/18/2008  . Primary osteoarthritis of both hands 11/18/2008  . Age related osteoporosis 11/18/2008  . MIGRAINES, HX OF 11/18/2008    Past Medical History:  Diagnosis Date  . Anxiety   . 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 Brain bleed ([place])   . Circumscribed scleroderma   . Depression   . Diverticulosis   . GERD (gastroesophageal reflux disease)   .  Hyperlipidemia   . IBS (irritable bowel syndrome)   . Memory loss   . Migraines   . Osteoarthritis   . Osteoporosis   . Stroke [facility]) 2015    Family History   Problem Relation Age of Onset  . Heart disease Father   . Liver cancer Paternal Aunt   . Prostate cancer Paternal Grandfather   . Colon cancer Neg Hx   . Stomach cancer Neg Hx    Past Surgical History:  Procedure Laterality Date  . broken ankle Left   . TOTAL HIP ARTHROPLASTY Left 01/06/2019   Procedure: TOTAL HIP ARTHROPLASTY ANTERIOR APPROACH;  Surgeon: Dorna Leitz, MD;  Location: [facility];  Service: Orthopedics;  Laterality: Left;  . TUBAL LIGATION     Social History   Social History Narrative   Daily caffeine, 2 glasses daily,   Right handed, Married, 2 sons. Retired. College.    There is no immunization history on file for this patient.   Objective: Vital Signs: BP 135/87 (BP Location: Right Arm, Patient Position: Sitting, Cuff Size: Normal)   Pulse 74   Resp 13   Ht 5\' 3"  (1.6 m)   Wt 151 lb 9.6 oz (68.8 kg)   BMI 26.85 kg/m    Physical Exam Vitals and nursing note reviewed.  Constitutional:      Appearance: She is well-developed.  HENT:     Head: Normocephalic and atraumatic.  Eyes:     Conjunctiva/sclera: Conjunctivae normal.  Cardiovascular:     Rate and Rhythm: Normal rate and regular rhythm.     Heart sounds: Normal heart sounds.  Pulmonary:     Effort: Pulmonary effort is normal.     Breath sounds: Normal breath sounds.  Abdominal:     General: Bowel sounds are normal.     Palpations: Abdomen is soft.  Musculoskeletal:     Cervical back: Normal range of motion.  Lymphadenopathy:     Cervical: No cervical adenopathy.  Skin:    General: Skin is warm and dry.     Capillary Refill: Capillary refill takes less than 2 seconds.  Neurological:     Mental Status: She is alert and oriented to person, place, and time.  Psychiatric:        Behavior: Behavior normal.      Musculoskeletal Exam: C-spine, thoracic and lumbar spine were in good range of motion.  Shoulder joints, elbow joints, wrist joints, MCPs PIPs and DIPs with good range of motion.  Hip  joints, knee joints, ankles, MTPs and PIPs with good range of motion with no synovitis.  CDAI Exam: CDAI Score: -- Patient Global: --; Provider Global: -- Swollen: --; Tender: -- Joint Exam 04/29/2020   No joint exam has been documented for this visit   There is currently no information documented on the homunculus. Go to the Rheumatology activity and complete the homunculus joint exam.  Investigation: No additional findings.  Imaging: No results found.  Recent Labs: Lab Results  Component Value Date   WBC 10.9 (H) 01/07/2019   HGB 10.2 (L) 01/07/2019   PLT 219 01/07/2019   NA 139 01/07/2019   K 3.8 01/07/2019   CL 108 01/07/2019   CO2 24 01/07/2019   GLUCOSE 159 (H) 01/07/2019   BUN 13 01/07/2019   CREATININE 0.67 01/07/2019   CALCIUM 8.4 (L) 01/07/2019   GFRAA >60 01/07/2019    Speciality Comments: No specialty comments available.  Procedures:  No procedures performed Allergies: Patient has no known allergies.  Assessment / Plan:     Visit Diagnoses: Morphea - She has followed by Dr. Sharol Roussel at [facility].  Patient states she has not seen him in the last 1 year.  She has been off methotrexate due to the pandemic.  She states she is uncertain if she wants to start the methotrexate at this point.  She will schedule an appointment with her dermatologist.  Patient states she had some labs by Dr. Radene Gunning and will forward the labs to [place].  High risk medication use - She is holding MTX 10 tabs po q wk, Folic acid rxd by Dr. Sharol Roussel due to the concern for covid-19.  09/09/19  Raynaud's phenomenon without gangrene-currently not active.  She had no telangiectasias or sclerodactyly.  Trochanteric bursitis of right hip-she has been having pain and discomfort.  She had tenderness over right trochanteric bursa.  She had x-ray by Dr. Berenice Primas in January which were unremarkable per patient.  I offered cortisone injection which she declined.  Have given her a handout on IT band  exercises.  I offered physical therapy which she declined.  Status post total hip replacement, left-doing well.  Primary osteoarthritis of both hands-she does not have much discomfort in her hands.  Age-related osteoporosis without current pathological fracture - DEXA on 04/04/19 right femoral neck T-score -2.6 with BMD 0.558.  Prolia,   She was previously on Reclast infusions.  She is followed by Dr. Forde Dandy.  Other medical problems are listed as follows:  History of depression  History of hypercholesterolemia  History of migraine  History of gastroesophageal reflux (GERD)  History of IBS  Orders: No orders of the defined types were placed in this encounter.  No orders of the defined types were placed in this encounter.    Follow-Up Instructions: Return in about 6 months (around 10/30/2020) for Morphea, osteoporosis.   Bo Merino, MD  Note - This record has been created using Editor, commissioning.  Chart creation errors have been sought, but may not always  have been located. Such creation errors do not reflect on  the standard of medical care.

## 2020-04-22 ENCOUNTER — Other Ambulatory Visit: Payer: Self-pay | Admitting: Rheumatology

## 2020-04-22 NOTE — Telephone Encounter (Signed)
Ok to refill 30-day supply

## 2020-04-22 NOTE — Telephone Encounter (Signed)
Last Visit: 10/30/19 Next Visit: 04/29/20 Labs: 09/05/19 WNL  Patient to update labs at upcoming appointment on 04/29/2020.  Okay to refill 30 day supply Celebrex and Baclofen?

## 2020-04-29 ENCOUNTER — Encounter: Payer: Self-pay | Admitting: Rheumatology

## 2020-04-29 ENCOUNTER — Ambulatory Visit: Payer: Medicare Other | Admitting: Rheumatology

## 2020-04-29 ENCOUNTER — Other Ambulatory Visit: Payer: Self-pay

## 2020-04-29 VITALS — BP 135/87 | HR 74 | Resp 13 | Ht 63.0 in | Wt 151.6 lb

## 2020-04-29 DIAGNOSIS — Z8659 Personal history of other mental and behavioral disorders: Secondary | ICD-10-CM

## 2020-04-29 DIAGNOSIS — Z79899 Other long term (current) drug therapy: Secondary | ICD-10-CM

## 2020-04-29 DIAGNOSIS — M810 Age-related osteoporosis without current pathological fracture: Secondary | ICD-10-CM

## 2020-04-29 DIAGNOSIS — L940 Localized scleroderma [morphea]: Secondary | ICD-10-CM

## 2020-04-29 DIAGNOSIS — Z8719 Personal history of other diseases of the digestive system: Secondary | ICD-10-CM

## 2020-04-29 DIAGNOSIS — M19042 Primary osteoarthritis, left hand: Secondary | ICD-10-CM

## 2020-04-29 DIAGNOSIS — Z8639 Personal history of other endocrine, nutritional and metabolic disease: Secondary | ICD-10-CM

## 2020-04-29 DIAGNOSIS — M7061 Trochanteric bursitis, right hip: Secondary | ICD-10-CM

## 2020-04-29 DIAGNOSIS — Z8669 Personal history of other diseases of the nervous system and sense organs: Secondary | ICD-10-CM

## 2020-04-29 DIAGNOSIS — I7300 Raynaud's syndrome without gangrene: Secondary | ICD-10-CM | POA: Diagnosis not present

## 2020-04-29 DIAGNOSIS — M19041 Primary osteoarthritis, right hand: Secondary | ICD-10-CM

## 2020-04-29 DIAGNOSIS — Z96642 Presence of left artificial hip joint: Secondary | ICD-10-CM

## 2020-04-29 DIAGNOSIS — M7062 Trochanteric bursitis, left hip: Secondary | ICD-10-CM

## 2020-04-29 NOTE — Patient Instructions (Signed)
Iliotibial Band Syndrome Rehab Ask your health care provider which exercises are safe for you. Do exercises exactly as told by your health care provider and adjust them as directed. It is normal to feel mild stretching, pulling, tightness, or discomfort as you do these exercises. Stop right away if you feel sudden pain or your pain gets significantly worse. Do not begin these exercises until told by your health care provider. Stretching and range-of-motion exercises These exercises warm up your muscles and joints and improve the movement and flexibility of your hip and pelvis. Quadriceps stretch, prone  1. Lie on your abdomen on a firm surface, such as a bed or padded floor (prone position). 2. Bend your left / right knee and reach back to hold your ankle or pant leg. If you cannot reach your ankle or pant leg, loop a belt around your foot and grab the belt instead. 3. Gently pull your heel toward your buttocks. Your knee should not slide out to the side. You should feel a stretch in the front of your thigh and knee (quadriceps). 4. Hold this position for __________ seconds. Repeat __________ times. Complete this exercise __________ times a day. Iliotibial band stretch An iliotibial band is a strong band of muscle tissue that runs from the outer side of your hip to the outer side of your thigh and knee. 1. Lie on your side with your left / right leg in the top position. 2. Bend both of your knees and grab your left / right ankle. Stretch out your bottom arm to help you balance. 3. Slowly bring your top knee back so your thigh goes behind your trunk. 4. Slowly lower your top leg toward the floor until you feel a gentle stretch on the outside of your left / right hip and thigh. If you do not feel a stretch and your knee will not fall farther, place the heel of your other foot on top of your knee and pull your knee down toward the floor with your foot. 5. Hold this position for __________  seconds. Repeat __________ times. Complete this exercise __________ times a day. Strengthening exercises These exercises build strength and endurance in your hip and pelvis. Endurance is the ability to use your muscles for a long time, even after they get tired. Straight leg raises, side-lying This exercise strengthens the muscles that rotate the leg at the hip and move it away from your body (hip abductors). 1. Lie on your side with your left / right leg in the top position. Lie so your head, shoulder, hip, and knee line up. You may bend your bottom knee to help you balance. 2. Roll your hips slightly forward so your hips are stacked directly over each other and your left / right knee is facing forward. 3. Tense the muscles in your outer thigh and lift your top leg 4-6 inches (10-15 cm). 4. Hold this position for __________ seconds. 5. Slowly return to the starting position. Let your muscles relax completely before doing another repetition. Repeat __________ times. Complete this exercise __________ times a day. Leg raises, prone This exercise strengthens the muscles that move the hips (hip extensors). 1. Lie on your abdomen on your bed or a firm surface. You can put a pillow under your hips if that is more comfortable for your lower back. 2. Bend your left / right knee so your foot is straight up in the air. 3. Squeeze your buttocks muscles and lift your left / right thigh   off the bed. Do not let your back arch. 4. Tense your thigh muscle as hard as you can without increasing any knee pain. 5. Hold this position for __________ seconds. 6. Slowly lower your leg to the starting position and allow it to relax completely. Repeat __________ times. Complete this exercise __________ times a day. Hip hike 1. Stand sideways on a bottom step. Stand on your left / right leg with your other foot unsupported next to the step. You can hold on to the railing or wall for balance if needed. 2. Keep your knees  straight and your torso square. Then lift your left / right hip up toward the ceiling. 3. Slowly let your left / right hip lower toward the floor, past the starting position. Your foot should get closer to the floor. Do not lean or bend your knees. Repeat __________ times. Complete this exercise __________ times a day. This information is not intended to replace advice given to you by your health care provider. Make sure you discuss any questions you have with your health care provider. Document Revised: 03/21/2019 Document Reviewed: 09/19/2018 Elsevier Patient Education  2020 Elsevier Inc.  

## 2020-05-06 LAB — IFOBT (OCCULT BLOOD): IFOBT: NEGATIVE

## 2020-05-26 ENCOUNTER — Other Ambulatory Visit (HOSPITAL_COMMUNITY): Payer: Self-pay

## 2020-05-28 ENCOUNTER — Ambulatory Visit (HOSPITAL_COMMUNITY)
Admission: RE | Admit: 2020-05-28 | Discharge: 2020-05-28 | Disposition: A | Discharge status: Home / Self Care | Payer: Medicare Other | Source: Ambulatory Visit | Attending: Internal Medicine | Admitting: Internal Medicine

## 2020-05-28 ENCOUNTER — Other Ambulatory Visit: Payer: Self-pay

## 2020-05-28 DIAGNOSIS — M810 Age-related osteoporosis without current pathological fracture: Secondary | ICD-10-CM | POA: Diagnosis not present

## 2020-05-28 MED ORDER — DENOSUMAB 60 MG/ML ~~LOC~~ SOSY: 60.0000 mg | PREFILLED_SYRINGE | Freq: Once | SUBCUTANEOUS | Status: AC

## 2020-05-28 MED ORDER — DENOSUMAB 60 MG/ML ~~LOC~~ SOSY
PREFILLED_SYRINGE | SUBCUTANEOUS | Status: AC
  Administered 2020-05-28: 60 mg via SUBCUTANEOUS
  Filled 2020-05-28: qty 1

## 2020-06-02 ENCOUNTER — Other Ambulatory Visit: Payer: Self-pay | Admitting: Rheumatology

## 2020-06-02 NOTE — Telephone Encounter (Signed)
Okay to refill Celebrex after we receive labs from her PCP.

## 2020-06-02 NOTE — Telephone Encounter (Addendum)
Last Visit: 04/29/2020  Next Visit: 10/21/2020 Labs: 09/09/2019 WNL  Patient advised she is due to update labs. Patient states she has had labs more recently with her PCP. Patient states she will contact their office and have them fax results.   Okay to refill 30 day supply Celebrex?

## 2020-06-04 NOTE — Telephone Encounter (Signed)
Contacted patient's PCP and requested labs x 2 and have not received the labs. Per Dr. Estanislado Pandy verbal prescription verified to send in Celebrex 200 mg Sig: TAKE 1 CAPSULE BY MOUTH DAILY AS NEEDED #30 with no refills.

## 2020-06-30 ENCOUNTER — Other Ambulatory Visit: Payer: Self-pay | Admitting: Rheumatology

## 2020-08-03 ENCOUNTER — Other Ambulatory Visit: Payer: Self-pay | Admitting: Rheumatology

## 2020-09-03 NOTE — Progress Notes (Signed)
Office Visit Note  Patient: Heather Lindsey             Date of Birth: [birth year]           MRN: [id number]             PCP: Prince Solian, MD Referring: Prince Solian, MD Visit Date: 09/17/2020 Occupation: @GUAROCC @  Subjective:  Pain of the Right Hip (Right buttock and right lateral hip pain, improved since doing exercises)   History of Present Illness: Heather Lindsey is a 74 y.o. female with history of morphea and osteoarthritis.  She states for the last 2 months she has been having pain and discomfort in her right hip which she describes over the trochanteric bursa.  She was concerned as she had arthritis in her left hip joint that her right hip may develop arthritis.  She has been doing stretching exercises which has been helpful and the pain has improved.  She continues to have some discomfort in her hands and her knee joints as well.  Morphea is a stable.  Activities of Daily Living:  Patient reports morning stiffness for 2-12 hours.   Patient Reports nocturnal pain.  Difficulty dressing/grooming: Denies Difficulty climbing stairs: Reports Difficulty getting out of chair: Denies Difficulty using hands for taps, buttons, cutlery, and/or writing: Reports  Review of Systems  Constitutional: Positive for fatigue.  HENT: Positive for mouth dryness. Negative for mouth sores and nose dryness.   Eyes: Positive for itching and dryness. Negative for pain and visual disturbance.  Respiratory: Positive for cough. Negative for hemoptysis, shortness of breath and difficulty breathing.        Due to GERD  Cardiovascular: Negative for chest pain, palpitations and swelling in legs/feet.  Gastrointestinal: Negative for abdominal pain, blood in stool, constipation and diarrhea.  Endocrine: Negative for increased urination.  Genitourinary: Negative for painful urination.  Musculoskeletal: Positive for arthralgias, joint pain, myalgias, morning stiffness and myalgias. Negative for joint swelling,  muscle weakness and muscle tenderness.  Skin: Negative for color change, rash and redness.  Allergic/Immunologic: Negative for susceptible to infections.  Neurological: Positive for headaches. Negative for dizziness, numbness, memory loss and weakness.  Hematological: Negative for swollen glands.  Psychiatric/Behavioral: Positive for sleep disturbance.    PMFS History:  Patient Active Problem List   Diagnosis Date Noted  . Cough 01/06/2019  . Anxiety 01/05/2019  . Left displaced femoral neck fracture ([place]) 01/05/2019  . Hyponatremia 01/05/2019  . Raynaud's phenomenon without gangrene 10/25/2016  . Trochanteric bursitis of both hips 10/25/2016  . Gastroesophageal reflux disease  10/25/2016  . Former smoker, 10/25/2016  . Intracranial hemorrhage, spontaneous subarachnoid, idiopathic, chronic ([place]) 09/02/2015  . 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 HYPERCHOLESTEROLEMIA 11/18/2008  . Depression 11/18/2008  . IRRITABLE BOWEL SYNDROME 11/18/2008  . Morphea 11/18/2008  . Primary osteoarthritis of both hands 11/18/2008  . Age related osteoporosis 11/18/2008  . MIGRAINES, HX OF 11/18/2008    Past Medical History:  Diagnosis Date  . Anxiety   . 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 Brain bleed ([place])   . Circumscribed scleroderma   . Depression   . Diverticulosis   . GERD (gastroesophageal reflux disease)   . Hyperlipidemia   . IBS (irritable bowel syndrome)   . Memory loss   . Migraines   . Osteoarthritis   . Osteoporosis   . Stroke [facility]) 2015    Family History  Problem Relation Age of  Onset  . Heart disease Father   . Liver cancer Paternal Aunt   . Prostate cancer Paternal Grandfather   . Colon cancer Neg Hx   . Stomach cancer Neg Hx   . Pancreatic cancer Neg Hx   . Esophageal cancer Neg Hx    Past Surgical History:  Procedure Laterality Date  . broken ankle Left   . TOTAL HIP ARTHROPLASTY Left 01/06/2019     Procedure: TOTAL HIP ARTHROPLASTY ANTERIOR APPROACH;  Surgeon: Dorna Leitz, MD;  Location: [facility];  Service: Orthopedics;  Laterality: Left;  . TUBAL LIGATION     Social History   Social History Narrative   Daily caffeine, 2 glasses daily,   Right handed, Married, 2 sons. Retired. College.   Immunization History  Administered Date(s) Administered  . PFIZER SARS-COV-2 Vaccination 12/28/2019, 01/18/2020, 08/07/2020  . Zoster Recombinat (Shingrix) 03/01/2018, 06/17/2018     Objective: Vital Signs: BP 106/66 (BP Location: Left Arm, Patient Position: Sitting, Cuff Size: Small)   Pulse 67   Ht 5\' 3"  (1.6 m)   Wt 154 lb 6.4 oz (70 kg)   BMI 27.35 kg/m    Physical Exam Vitals and nursing note reviewed.  Constitutional:      Appearance: She is well-developed.  HENT:     Head: Normocephalic and atraumatic.  Eyes:     Conjunctiva/sclera: Conjunctivae normal.  Cardiovascular:     Rate and Rhythm: Normal rate and regular rhythm.     Heart sounds: Normal heart sounds.  Pulmonary:     Effort: Pulmonary effort is normal.     Breath sounds: Normal breath sounds.  Abdominal:     General: Bowel sounds are normal.     Palpations: Abdomen is soft.  Musculoskeletal:     Cervical back: Normal range of motion.  Lymphadenopathy:     Cervical: No cervical adenopathy.  Skin:    General: Skin is warm and dry.     Capillary Refill: Capillary refill takes less than 2 seconds.     Comments: She has  morphea patches.  Neurological:     Mental Status: She is alert and oriented to person, place, and time.  Psychiatric:        Behavior: Behavior normal.      Musculoskeletal Exam: C-spine thoracic and lumbar spine were in good range of motion.  She has some discomfort range of motion of her right shoulder joint.  Elbow joints, wrist joints, MCPs PIPs and DIPs with good range of motion with no synovitis.  She had good range of motion of right hip joint.  She had tenderness over right  trochanteric bursa.  Left hip joint is replaced.  Knee joints were in good range of motion.  She had no tenderness over PIPs or DIPs of her feet.  CDAI Exam: CDAI Score: -- Patient Global: --; Provider Global: -- Swollen: --; Tender: -- Joint Exam 09/17/2020   No joint exam has been documented for this visit   There is currently no information documented on the homunculus. Go to the Rheumatology activity and complete the homunculus joint exam.  Investigation: No additional findings.  Imaging: No results found.  Recent Labs: Lab Results  Component Value Date   WBC 10.9 (H) 01/07/2019   HGB 10.2 (L) 01/07/2019   PLT 219 01/07/2019   NA 139 01/07/2019   K 3.8 01/07/2019   CL 108 01/07/2019   CO2 24 01/07/2019   GLUCOSE 159 (H) 01/07/2019   BUN 13 01/07/2019   CREATININE 0.67  01/07/2019   CALCIUM 8.4 (L) 01/07/2019   GFRAA >60 01/07/2019    Speciality Comments: No specialty comments available.  Procedures:  No procedures performed Allergies: Patient has no known allergies.   Assessment / Plan:     Visit Diagnoses: Morphea - followed by Dr. Sharol Roussel at [facility].  She has not seen much changes in her morphea.  High risk medication use - holding MTX 10 tabs po q wk, Folic acid rxd by Dr. Sharol Roussel due to the concern for covid-19.  09/09/19.  Her labs are followed by her PCP and dermatologist.  Raynaud's phenomenon without gangrene-currently not symptomatic.  She had no sclerodactyly and no nailbed capillary changes.  Keeping core temperature warm and warm clothing was discussed when the temperature goes down.  Chronic right shoulder pain-she has been having some discomfort in her right shoulder joint especially when she tries to reach back in her car.  Have given her a handout on shoulder joint exercises.  She had no tenderness on palpation.  Status post total hip replacement, left-doing well.  Trochanteric bursitis of both hips-she has been having pain and  discomfort over right trochanteric bursa.  Her symptoms are gradually improving.  Have given her a handout on IT band exercises.  I offered a cortisone injection in case her symptoms get worse.  Primary osteoarthritis of both hands-joint protection muscle strengthening was discussed.  Age-related osteoporosis without current pathological fracture - She is on Prolia injections which she receives at the hospital.  Prescribed by Dr. Dagmar Hait.  History of depression  History of hypercholesterolemia  History of migraine  History of gastroesophageal reflux (GERD)  History of IBS  Educated about COVID-19 virus infection-she is fully immunized against COVID-19.  She also received a booster.  Use of mask, social distancing and hand hygiene was discussed.  Use of monoclonal antibodies were also discussed in case she develops COVID-19 infection.  Orders: No orders of the defined types were placed in this encounter.  No orders of the defined types were placed in this encounter.    Follow-Up Instructions: Return in about 6 months (around 03/18/2021) for Morphea, OA.   Bo Merino, MD  Note - This record has been created using Editor, commissioning.  Chart creation errors have been sought, but may not always  have been located. Such creation errors do not reflect on  the standard of medical care.

## 2020-09-15 ENCOUNTER — Ambulatory Visit (INDEPENDENT_AMBULATORY_CARE_PROVIDER_SITE_OTHER): Payer: Medicare Other | Admitting: Gastroenterology

## 2020-09-15 ENCOUNTER — Encounter: Payer: Self-pay | Admitting: Gastroenterology

## 2020-09-15 VITALS — BP 100/60 | HR 80 | Ht 62.0 in | Wt 152.0 lb

## 2020-09-15 DIAGNOSIS — K2100 Gastro-esophageal reflux disease with esophagitis, without bleeding: Secondary | ICD-10-CM | POA: Diagnosis not present

## 2020-09-15 DIAGNOSIS — R12 Heartburn: Secondary | ICD-10-CM | POA: Diagnosis not present

## 2020-09-15 MED ORDER — PANTOPRAZOLE SODIUM 40 MG PO TBEC: 40.0000 mg | DELAYED_RELEASE_TABLET | Freq: Every day | ORAL | 1 refills | Status: DC

## 2020-09-15 NOTE — Patient Instructions (Signed)
If you are age 73 or older, your body mass index should be between 23-30. Your Body mass index is 27.8 kg/m. If this is out of the aforementioned range listed, please consider follow up with your Primary Care Provider.  If you are age 46 or younger, your body mass index should be between 19-25. Your Body mass index is 27.8 kg/m. If this is out of the aformentioned range listed, please consider follow up with your Primary Care Provider.   Follow up as needed.   It was a pleasure to see you today!  Dr. Loletha Carrow

## 2020-09-15 NOTE — Progress Notes (Signed)
[facility] Progress Note  Chief Complaint: Heartburn  Subjective  History: From my September 2019 office note: "Heather Lindsey was seen in June 2018 for GERD and chronic cough that seems at least partially allergy and postnasal drip related.  She also has left lower quadrant pain that responds to hyoscyamine and is consistent with IBS. EGD and colonoscopy done May 2018   Heather Lindsey is feeling well, with occasional brief heartburn with certain foods such as tomatoes.  She denies dysphagia, odynophagia, nausea, vomiting, early satiety or weight loss.  She tends toward constipation, particularly if she travels.  If she does not have a BM for about 3 days, she will take some Senokot.  She tries to consume a high-fiber diet, and has rarely needed the hyoscyamine for lower abdominal cramps."   No changes to treatment at that visit  Last EGD 04/2017 with patulous LES and Grade A refluxesophagitis ________________________________________  [name] says she is doing fairly well overall.  She is still bothered by frequent heartburn that sometimes might occur just after drinking water.  She takes Protonix on occasion, and when she does not want to take that, she might take multiple Tums in a day.  She has bloating and gas that she is attributed to her IBS.  Regurgitation occurs while bending over working in the garden. She denies dysphagia or odynophagia.  ROS: Cardiovascular:  no chest pain Respiratory: no dyspnea Arthralgias Depression and anxiety, stable on meds Remainder of systems negative except as above  The patient's Past Medical, Family and Social History were reviewed and are on file in the EMR. She had a hip replacement after a fracture in January 2020 Most recent rheumatology note from May 2021 reviewed.  Patient has a skin condition called morphea and was on methotrexate until she stopped it due to immunosuppressive concerns during the pandemic. Hypertension, osteoporosis,  depression   Objective:  Med list reviewed  Current Outpatient Medications:  .  acetaminophen (TYLENOL) 500 MG tablet, Take 1,000 mg by mouth every 6 (six) hours as needed for mild pain, moderate pain or headache. , Disp: , Rfl:  .  ALPRAZolam (XANAX) 0.5 MG tablet, Take 0.25 mg by mouth daily as needed for anxiety or sleep. , Disp: , Rfl:  .  amLODipine (NORVASC) 5 MG tablet, Once a day po, watch for puffy feet ! (Patient taking differently: Take 5 mg by mouth daily. ), Disp: 90 tablet, Rfl: 3 .  baclofen (LIORESAL) 10 MG tablet, TAKE 1 TABLET BY MOUTH AT  8AM AND 2PM AS NEEDED, Disp: 60 tablet, Rfl: 0 .  calcium carbonate (TUMS - DOSED IN MG ELEMENTAL CALCIUM) 500 MG chewable tablet, Chew 1 tablet by mouth 2 (two) times daily as needed for indigestion or heartburn. , Disp: , Rfl:  .  denosumab (PROLIA) 60 MG/ML SOSY injection, Inject 60 mg into the skin every 6 (six) months., Disp: , Rfl:  .  docusate sodium (COLACE) 100 MG capsule, Take 1 capsule (100 mg total) by mouth 2 (two) times daily. (Patient taking differently: Take 100 mg by mouth as needed. ), Disp: 60 capsule, Rfl: 0 .  fluticasone (FLONASE) 50 MCG/ACT nasal spray, Place 1 spray into both nostrils daily as needed for allergies or rhinitis., Disp: , Rfl:  .  hyoscyamine (LEVSIN) 0.125 MG tablet, Take 0.125 mg by mouth every 4 (four) hours as needed for bladder spasms or cramping. , Disp: , Rfl:  .  loratadine (CLARITIN) 10 MG tablet, Take 10 mg by  mouth daily as needed for allergies. , Disp: , Rfl:  .  Multiple Vitamins-Minerals (CENTRUM PO), Take 1 capsule by mouth daily.  , Disp: , Rfl:  .  pantoprazole (PROTONIX) 40 MG tablet, Take 1 tablet (40 mg total) by mouth daily., Disp: 90 tablet, Rfl: 1 .  venlafaxine (EFFEXOR) 75 MG tablet, Take 75 mg by mouth daily after breakfast. , Disp: , Rfl:    Vital signs in last 24 hrs: Vitals:   09/15/20 0929  BP: 100/60  Pulse: 80  SpO2: 98%    Physical Exam  Well-appearing, normal  vocal quality  HEENT: sclera anicteric, oral mucosa moist without lesions  Neck: supple, no thyromegaly, JVD or lymphadenopathy  Cardiac: RRR without murmurs, S1S2 heard, no peripheral edema  Pulm: clear to auscultation bilaterally, normal RR and effort noted  Abdomen: soft, no tenderness, with active bowel sounds. No guarding or palpable hepatosplenomegaly.  Skin; warm and dry, no jaundice or rash  Labs:   ___________________________________________ Radiologic studies:   ____________________________________________ Other:   _____________________________________________ Assessment & Plan  Assessment: Encounter Diagnoses  Name Primary?  . Gastroesophageal reflux disease with esophagitis without hemorrhage Yes  . Heartburn    Reflux based on symptoms and previous endoscopic findings.  Frequent heartburn, sometimes triggered just by water.  We discussed the complex nature of reflux, limitation of acid suppression medicines, concept of nonacid reflux and also functional heartburn.  I recommended she consider pH and impedance testing off PPI therapy to quantify her reflux.  This might help determine best long-term option of acid suppression versus TCA (a functional heartburn), or consideration of endoscopic or surgical therapy if severe reflux is present. Wireless pH device placement during endoscopy is also an option, but would not be able to detect nonacid reflux the way that impedance testing can do.  She opted for a refill of Protonix so she can take a daily for a while while she considers her options.  We discussed the possible risk of PPI therapy affecting calcium absorption and leading to osteoporosis,, so trying to minimize or perhaps get off PPI therapy possible is important with her history.  She has good questions, was appreciative for the thorough discussion of reflux, and would like to contact me as needed.  30 minutes were spent on this encounter (including chart  review, history/exam, counseling/coordination of care, and documentation)  [name]

## 2020-09-17 ENCOUNTER — Other Ambulatory Visit: Payer: Self-pay

## 2020-09-17 ENCOUNTER — Encounter: Payer: Self-pay | Admitting: Rheumatology

## 2020-09-17 ENCOUNTER — Ambulatory Visit: Payer: Medicare Other | Admitting: Rheumatology

## 2020-09-17 VITALS — BP 106/66 | HR 67 | Ht 63.0 in | Wt 154.4 lb

## 2020-09-17 DIAGNOSIS — G8929 Other chronic pain: Secondary | ICD-10-CM

## 2020-09-17 DIAGNOSIS — Z79899 Other long term (current) drug therapy: Secondary | ICD-10-CM | POA: Diagnosis not present

## 2020-09-17 DIAGNOSIS — Z96642 Presence of left artificial hip joint: Secondary | ICD-10-CM

## 2020-09-17 DIAGNOSIS — Z7189 Other specified counseling: Secondary | ICD-10-CM

## 2020-09-17 DIAGNOSIS — Z8659 Personal history of other mental and behavioral disorders: Secondary | ICD-10-CM

## 2020-09-17 DIAGNOSIS — M19042 Primary osteoarthritis, left hand: Secondary | ICD-10-CM

## 2020-09-17 DIAGNOSIS — Z8719 Personal history of other diseases of the digestive system: Secondary | ICD-10-CM

## 2020-09-17 DIAGNOSIS — M7061 Trochanteric bursitis, right hip: Secondary | ICD-10-CM

## 2020-09-17 DIAGNOSIS — I7300 Raynaud's syndrome without gangrene: Secondary | ICD-10-CM | POA: Diagnosis not present

## 2020-09-17 DIAGNOSIS — L940 Localized scleroderma [morphea]: Secondary | ICD-10-CM

## 2020-09-17 DIAGNOSIS — M25511 Pain in right shoulder: Secondary | ICD-10-CM

## 2020-09-17 DIAGNOSIS — M7062 Trochanteric bursitis, left hip: Secondary | ICD-10-CM

## 2020-09-17 DIAGNOSIS — Z8669 Personal history of other diseases of the nervous system and sense organs: Secondary | ICD-10-CM

## 2020-09-17 DIAGNOSIS — M19041 Primary osteoarthritis, right hand: Secondary | ICD-10-CM

## 2020-09-17 DIAGNOSIS — M810 Age-related osteoporosis without current pathological fracture: Secondary | ICD-10-CM

## 2020-09-17 DIAGNOSIS — Z8639 Personal history of other endocrine, nutritional and metabolic disease: Secondary | ICD-10-CM

## 2020-09-17 NOTE — Patient Instructions (Signed)
Shoulder Exercises Ask your health care provider which exercises are safe for you. Do exercises exactly as told by your health care provider and adjust them as directed. It is normal to feel mild stretching, pulling, tightness, or discomfort as you do these exercises. Stop right away if you feel sudden pain or your pain gets worse. Do not begin these exercises until told by your health care provider. Stretching exercises External rotation and abduction This exercise is sometimes called corner stretch. This exercise rotates your arm outward (external rotation) and moves your arm out from your body (abduction). 1. Stand in a doorway with one of your feet slightly in front of the other. This is called a staggered stance. If you cannot reach your forearms to the door frame, stand facing a corner of a room. 2. Choose one of the following positions as told by your health care provider: ? Place your hands and forearms on the door frame above your head. ? Place your hands and forearms on the door frame at the height of your head. ? Place your hands on the door frame at the height of your elbows. 3. Slowly move your weight onto your front foot until you feel a stretch across your chest and in the front of your shoulders. Keep your head and chest upright and keep your abdominal muscles tight. 4. Hold for __________ seconds. 5. To release the stretch, shift your weight to your back foot. Repeat __________ times. Complete this exercise __________ times a day. Extension, standing 1. Stand and hold a broomstick, a cane, or a similar object behind your back. ? Your hands should be a little wider than shoulder width apart. ? Your palms should face away from your back. 2. Keeping your elbows straight and your shoulder muscles relaxed, move the stick away from your body until you feel a stretch in your shoulders (extension). ? Avoid shrugging your shoulders while you move the stick. Keep your shoulder blades tucked  down toward the middle of your back. 3. Hold for __________ seconds. 4. Slowly return to the starting position. Repeat __________ times. Complete this exercise __________ times a day. Range-of-motion exercises Pendulum  1. Stand near a wall or a surface that you can hold onto for balance. 2. Bend at the waist and let your left / right arm hang straight down. Use your other arm to support you. Keep your back straight and do not lock your knees. 3. Relax your left / right arm and shoulder muscles, and move your hips and your trunk so your left / right arm swings freely. Your arm should swing because of the motion of your body, not because you are using your arm or shoulder muscles. 4. Keep moving your hips and trunk so your arm swings in the following directions, as told by your health care provider: ? Side to side. ? Forward and backward. ? In clockwise and counterclockwise circles. 5. Continue each motion for __________ seconds, or for as long as told by your health care provider. 6. Slowly return to the starting position. Repeat __________ times. Complete this exercise __________ times a day. Shoulder flexion, standing  1. Stand and hold a broomstick, a cane, or a similar object. Place your hands a little more than shoulder width apart on the object. Your left / right hand should be palm up, and your other hand should be palm down. 2. Keep your elbow straight and your shoulder muscles relaxed. Push the stick up with your healthy arm to  raise your left / right arm in front of your body, and then over your head until you feel a stretch in your shoulder (flexion). ? Avoid shrugging your shoulder while you raise your arm. Keep your shoulder blade tucked down toward the middle of your back. 3. Hold for __________ seconds. 4. Slowly return to the starting position. Repeat __________ times. Complete this exercise __________ times a day. Shoulder abduction, standing 1. Stand and hold a broomstick,  a cane, or a similar object. Place your hands a little more than shoulder width apart on the object. Your left / right hand should be palm up, and your other hand should be palm down. 2. Keep your elbow straight and your shoulder muscles relaxed. Push the object across your body toward your left / right side. Raise your left / right arm to the side of your body (abduction) until you feel a stretch in your shoulder. ? Do not raise your arm above shoulder height unless your health care provider tells you to do that. ? If directed, raise your arm over your head. ? Avoid shrugging your shoulder while you raise your arm. Keep your shoulder blade tucked down toward the middle of your back. 3. Hold for __________ seconds. 4. Slowly return to the starting position. Repeat __________ times. Complete this exercise __________ times a day. Internal rotation  1. Place your left / right hand behind your back, palm up. 2. Use your other hand to dangle an exercise band, a towel, or a similar object over your shoulder. Grasp the band with your left / right hand so you are holding on to both ends. 3. Gently pull up on the band until you feel a stretch in the front of your left / right shoulder. The movement of your arm toward the center of your body is called internal rotation. ? Avoid shrugging your shoulder while you raise your arm. Keep your shoulder blade tucked down toward the middle of your back. 4. Hold for __________ seconds. 5. Release the stretch by letting go of the band and lowering your hands. Repeat __________ times. Complete this exercise __________ times a day. Strengthening exercises External rotation  1. Sit in a stable chair without armrests. 2. Secure an exercise band to a stable object at elbow height on your left / right side. 3. Place a soft object, such as a folded towel or a small pillow, between your left / right upper arm and your body to move your elbow about 4 inches (10 cm) away  from your side. 4. Hold the end of the exercise band so it is tight and there is no slack. 5. Keeping your elbow pressed against the soft object, slowly move your forearm out, away from your abdomen (external rotation). Keep your body steady so only your forearm moves. 6. Hold for __________ seconds. 7. Slowly return to the starting position. Repeat __________ times. Complete this exercise __________ times a day. Shoulder abduction  1. Sit in a stable chair without armrests, or stand up. 2. Hold a __________ weight in your left / right hand, or hold an exercise band with both hands. 3. Start with your arms straight down and your left / right palm facing in, toward your body. 4. Slowly lift your left / right hand out to your side (abduction). Do not lift your hand above shoulder height unless your health care provider tells you that this is safe. ? Keep your arms straight. ? Avoid shrugging your shoulder while you  do this movement. Keep your shoulder blade tucked down toward the middle of your back. 5. Hold for __________ seconds. 6. Slowly lower your arm, and return to the starting position. Repeat __________ times. Complete this exercise __________ times a day. Shoulder extension 1. Sit in a stable chair without armrests, or stand up. 2. Secure an exercise band to a stable object in front of you so it is at shoulder height. 3. Hold one end of the exercise band in each hand. Your palms should face each other. 4. Straighten your elbows and lift your hands up to shoulder height. 5. Step back, away from the secured end of the exercise band, until the band is tight and there is no slack. 6. Squeeze your shoulder blades together as you pull your hands down to the sides of your thighs (extension). Stop when your hands are straight down by your sides. Do not let your hands go behind your body. 7. Hold for __________ seconds. 8. Slowly return to the starting position. Repeat __________ times.  Complete this exercise __________ times a day. Shoulder row 1. Sit in a stable chair without armrests, or stand up. 2. Secure an exercise band to a stable object in front of you so it is at waist height. 3. Hold one end of the exercise band in each hand. Position your palms so that your thumbs are facing the ceiling (neutral position). 4. Bend each of your elbows to a 90-degree angle (right angle) and keep your upper arms at your sides. 5. Step back until the band is tight and there is no slack. 6. Slowly pull your elbows back behind you. 7. Hold for __________ seconds. 8. Slowly return to the starting position. Repeat __________ times. Complete this exercise __________ times a day. Shoulder press-ups  1. Sit in a stable chair that has armrests. Sit upright, with your feet flat on the floor. 2. Put your hands on the armrests so your elbows are bent and your fingers are pointing forward. Your hands should be about even with the sides of your body. 3. Push down on the armrests and use your arms to lift yourself off the chair. Straighten your elbows and lift yourself up as much as you comfortably can. ? Move your shoulder blades down, and avoid letting your shoulders move up toward your ears. ? Keep your feet on the ground. As you get stronger, your feet should support less of your body weight as you lift yourself up. 4. Hold for __________ seconds. 5. Slowly lower yourself back into the chair. Repeat __________ times. Complete this exercise __________ times a day. Wall push-ups  1. Stand so you are facing a stable wall. Your feet should be about one arm-length away from the wall. 2. Lean forward and place your palms on the wall at shoulder height. 3. Keep your feet flat on the floor as you bend your elbows and lean forward toward the wall. 4. Hold for __________ seconds. 5. Straighten your elbows to push yourself back to the starting position. Repeat __________ times. Complete this exercise  __________ times a day. This information is not intended to replace advice given to you by your health care provider. Make sure you discuss any questions you have with your health care provider. Document Revised: 03/22/2019 Document Reviewed: 12/28/2018 [employer] Patient Education  Eddy Band Syndrome [facility]hab Ask your health care provider which exercises are safe for you. Do exercises exactly as told by your health care provider and adjust  them as directed. It is normal to feel mild stretching, pulling, tightness, or discomfort as you do these exercises. Stop right away if you feel sudden pain or your pain gets significantly worse. Do not begin these exercises until told by your health care provider. Stretching and range-of-motion exercises These exercises warm up your muscles and joints and improve the movement and flexibility of your hip and pelvis. Quadriceps stretch, prone  6. Lie on your abdomen on a firm surface, such as a bed or padded floor (prone position). 7. Bend your left / right knee and reach back to hold your ankle or pant leg. If you cannot reach your ankle or pant leg, loop a belt around your foot and grab the belt instead. 8. Gently pull your heel toward your buttocks. Your knee should not slide out to the side. You should feel a stretch in the front of your thigh and knee (quadriceps). 9. Hold this position for __________ seconds. Repeat __________ times. Complete this exercise __________ times a day. Iliotibial band stretch An iliotibial band is a strong band of muscle tissue that runs from the outer side of your hip to the outer side of your thigh and knee. 5. Lie on your side with your left / right leg in the top position. 6. Bend both of your knees and grab your left / right ankle. Stretch out your bottom arm to help you balance. 7. Slowly bring your top knee back so your thigh goes behind your trunk. 8. Slowly lower your top leg toward the floor until  you feel a gentle stretch on the outside of your left / right hip and thigh. If you do not feel a stretch and your knee will not fall farther, place the heel of your other foot on top of your knee and pull your knee down toward the floor with your foot. 9. Hold this position for __________ seconds. Repeat __________ times. Complete this exercise __________ times a day. Strengthening exercises These exercises build strength and endurance in your hip and pelvis. Endurance is the ability to use your muscles for a long time, even after they get tired. Straight leg raises, side-lying This exercise strengthens the muscles that rotate the leg at the hip and move it away from your body (hip abductors). 7. Lie on your side with your left / right leg in the top position. Lie so your head, shoulder, hip, and knee line up. You may bend your bottom knee to help you balance. 8. Roll your hips slightly forward so your hips are stacked directly over each other and your left / right knee is facing forward. 9. Tense the muscles in your outer thigh and lift your top leg 4-6 inches (10-15 cm). 10. Hold this position for __________ seconds. 11. Slowly return to the starting position. Let your muscles relax completely before doing another repetition. Repeat __________ times. Complete this exercise __________ times a day. Leg raises, prone This exercise strengthens the muscles that move the hips (hip extensors). 5. Lie on your abdomen on your bed or a firm surface. You can put a pillow under your hips if that is more comfortable for your lower back. 6. Bend your left / right knee so your foot is straight up in the air. 7. Squeeze your buttocks muscles and lift your left / right thigh off the bed. Do not let your back arch. 8. Tense your thigh muscle as hard as you can without increasing any knee pain. 9. Hold this  position for __________ seconds. 10. Slowly lower your leg to the starting position and allow it to relax  completely. Repeat __________ times. Complete this exercise __________ times a day. Hip hike 5. Stand sideways on a bottom step. Stand on your left / right leg with your other foot unsupported next to the step. You can hold on to the railing or wall for balance if needed. 6. Keep your knees straight and your torso square. Then lift your left / right hip up toward the ceiling. 7. Slowly let your left / right hip lower toward the floor, past the starting position. Your foot should get closer to the floor. Do not lean or bend your knees. Repeat __________ times. Complete this exercise __________ times a day. This information is not intended to replace advice given to you by your health care provider. Make sure you discuss any questions you have with your health care provider. Document Revised: 03/21/2019 Document Reviewed: 09/19/2018 [employer] Patient Education  [place].

## 2020-09-18 ENCOUNTER — Other Ambulatory Visit: Payer: Self-pay | Admitting: Rheumatology

## 2020-10-21 ENCOUNTER — Ambulatory Visit: Payer: Medicare Other | Admitting: Rheumatology

## 2020-12-06 ENCOUNTER — Other Ambulatory Visit: Payer: Self-pay | Admitting: Rheumatology

## 2020-12-07 ENCOUNTER — Other Ambulatory Visit: Payer: Self-pay | Admitting: *Deleted

## 2020-12-07 MED ORDER — PANTOPRAZOLE SODIUM 40 MG PO TBEC: 40.0000 mg | DELAYED_RELEASE_TABLET | Freq: Every day | ORAL | 0 refills | Status: DC

## 2020-12-07 NOTE — Telephone Encounter (Signed)
Last Visit: 09/17/2020 Next Visit: 03/18/2021  Okay to refill Baclofen?

## 2021-02-07 ENCOUNTER — Other Ambulatory Visit: Payer: Self-pay | Admitting: Gastroenterology

## 2021-03-05 NOTE — Progress Notes (Signed)
Office Visit Note  Patient: Heather Lindsey             Date of Birth: Sep 23, 1946           MRN: [id number]             PCP: Prince Solian, MD Referring: Prince Solian, MD Visit Date: 03/18/2021 Occupation: @GUAROCC @  Subjective:  Pain in both hips   History of Present Illness: Heather Lindsey is a 75 y.o. female with history of morphea, osteoporosis, and osteoarthritis.  She is no longer taking methotrexate due to the concern for immunosuppression during the COVID-19 pandemic.  She denies any progression in morphea while off of methotrexate.  She has not seen her dermatologist since the pandemic started.  She has intermittent symptoms of Raynaud's but overall her symptoms have been well controlled while taking amlodipine 5 mg by mouth daily.  She denies any tender ulcerations. She presents today with ongoing pain in both hands and discomfort due to trochanteric bursitis of both hips.  She has noticed increased joint pain since discontinuing Celebrex.  She has been taking Tylenol arthritis 650 mg twice daily for pain relief.  She has noticed increased stiffness in both hands and has difficulty making a complete fist at times.  She has also noticed decreased grip strength.  She states that her discomfort due to trochanteric bursitis is exacerbated by sitting in the car for prolonged period of time.  She also has pain when laying on her sides at night.  She states that her left hip replacement is doing well.  She denies any recent falls or fractures.  She continues to take a calcium supplement on a daily basis but has not been taking vitamin D supplement.  She plans on calling her PCPs office to check when her next Prolia dose is due.   Activities of Daily Living:  Patient reports morning stiffness for 30-60 minutes.   Patient Reports nocturnal pain.  Difficulty dressing/grooming: Denies Difficulty climbing stairs: Denies Difficulty getting out of chair: Denies Difficulty using hands for taps,  buttons, cutlery, and/or writing: Reports  Review of Systems  Constitutional: Positive for fatigue.  HENT: Positive for mouth dryness and nose dryness. Negative for mouth sores.   Eyes: Positive for dryness. Negative for pain and itching.  Respiratory: Negative for shortness of breath and difficulty breathing.   Cardiovascular: Negative for chest pain and palpitations.  Gastrointestinal: Negative for blood in stool, constipation and diarrhea.  Endocrine: Negative for increased urination.  Genitourinary: Negative for difficulty urinating.  Musculoskeletal: Positive for arthralgias, joint pain, joint swelling, myalgias, morning stiffness, muscle tenderness and myalgias.  Skin: Negative for color change, rash and redness.  Allergic/Immunologic: Negative for susceptible to infections.  Neurological: Positive for headaches. Negative for dizziness, numbness, memory loss and weakness.  Hematological: Positive for bruising/bleeding tendency.  Psychiatric/Behavioral: Negative for confusion.    PMFS History:  Patient Active Problem List   Diagnosis Date Noted  . Cough 01/06/2019  . Anxiety 01/05/2019  . Left displaced femoral neck fracture ([place]) 01/05/2019  . Hyponatremia 01/05/2019  . Raynaud's phenomenon without gangrene 10/25/2016  . Trochanteric bursitis of both hips 10/25/2016  . Gastroesophageal reflux disease  10/25/2016  . Former smoker, 10/25/2016  . Intracranial hemorrhage, spontaneous subarachnoid, idiopathic, chronic ([place]) 09/02/2015  . 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 HYPERCHOLESTEROLEMIA 11/18/2008  . Depression 11/18/2008  . IRRITABLE BOWEL SYNDROME 11/18/2008  . Morphea 11/18/2008  . Primary osteoarthritis of both hands 11/18/2008  .  Age related osteoporosis 11/18/2008  . MIGRAINES, HX OF 11/18/2008    Past Medical History:  Diagnosis Date  . Anxiety   . 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 Brain  bleed ([place])   . Circumscribed scleroderma   . Depression   . Diverticulosis   . GERD (gastroesophageal reflux disease)   . Hyperlipidemia   . IBS (irritable bowel syndrome)   . Memory loss   . Migraines   . Osteoarthritis   . Osteoporosis   . Stroke [facility]) 2015    Family History  Problem Relation Age of Onset  . Heart disease Father   . Liver cancer Paternal Aunt   . Prostate cancer Paternal Grandfather   . Colon cancer Neg Hx   . Stomach cancer Neg Hx   . Pancreatic cancer Neg Hx   . Esophageal cancer Neg Hx    Past Surgical History:  Procedure Laterality Date  . broken ankle Left   . TOTAL HIP ARTHROPLASTY Left 01/06/2019   Procedure: TOTAL HIP ARTHROPLASTY ANTERIOR APPROACH;  Surgeon: Dorna Leitz, MD;  Location: [facility];  Service: Orthopedics;  Laterality: Left;  . TUBAL LIGATION     Social History   Social History Narrative   Daily caffeine, 2 glasses daily,   Right handed, Married, 2 sons. Retired. College.   Immunization History  Administered Date(s) Administered  . PFIZER(Purple Top)SARS-COV-2 Vaccination 12/28/2019, 01/18/2020, 08/07/2020  . Zoster Recombinat (Shingrix) 03/01/2018, 06/17/2018     Objective: Vital Signs: BP 110/74 (BP Location: Left Arm, Patient Position: Sitting, Cuff Size: Normal)   Pulse 66   Ht 5\' 3"  (1.6 m)   Wt 155 lb 6.4 oz (70.5 kg)   BMI 27.53 kg/m    Physical Exam Vitals and nursing note reviewed.  Constitutional:      Appearance: She is well-developed.  HENT:     Head: Normocephalic and atraumatic.  Eyes:     Conjunctiva/sclera: Conjunctivae normal.  Pulmonary:     Effort: Pulmonary effort is normal.  Abdominal:     Palpations: Abdomen is soft.  Musculoskeletal:     Cervical back: Normal range of motion.  Skin:    General: Skin is warm and dry.     Capillary Refill: Capillary refill takes less than 2 seconds.     Comments: Morphea patches noted. No digital ulcerations or signs of sclerodactyly   Neurological:      Mental Status: She is alert and oriented to person, place, and time.  Psychiatric:        Behavior: Behavior normal.      Musculoskeletal Exam: C-spine, thoracic spine, lumbar spine have good range of motion.  Shoulder joints have good range of motion with no discomfort.  Elbow joints, wrist joints, MCPs, PIPs, DIPs have good range of motion with no synovitis.  She has PIP and DIP thickening consistent with osteoarthritis of both hands.  CMC joint pain and tenderness bilaterally.  Left hip replacement has good range of motion.  Right hip has good range of motion with no discomfort.  Tenderness to palpation over bilateral trochanteric bursa.  Knee joints have good range of motion with no warmth or effusion.  Ankle joints have good range of motion with no tenderness or inflammation.  CDAI Exam: CDAI Score: -- Patient Global: --; Provider Global: -- Swollen: --; Tender: -- Joint Exam 03/18/2021   No joint exam has been documented for this visit   There is currently no information documented on the homunculus.  Go to the Rheumatology activity and complete the homunculus joint exam.  Investigation: No additional findings.  Imaging: No results found.  Recent Labs: Lab Results  Component Value Date   WBC 10.9 (H) 01/07/2019   HGB 10.2 (L) 01/07/2019   PLT 219 01/07/2019   NA 139 01/07/2019   K 3.8 01/07/2019   CL 108 01/07/2019   CO2 24 01/07/2019   GLUCOSE 159 (H) 01/07/2019   BUN 13 01/07/2019   CREATININE 0.67 01/07/2019   CALCIUM 8.4 (L) 01/07/2019   GFRAA >60 01/07/2019    Speciality Comments: No specialty comments available.  Procedures:  No procedures performed Allergies: Patient has no known allergies.   Assessment / Plan:     Visit Diagnoses: Morphea - Previously followed by Dr. Sharol Roussel at [facility].  She has not developed any new patches and has not noticed any progression.  She is no longer taking methotrexate due to the concern for immunosuppression  during the COVID-19 pandemic.  She declined wanting to restart methotrexate at this time.  She has not seen Dr. Sharol Roussel since 2020.  She was advised to notify us if she develops any new or worsening symptoms. She will follow up in 6 months.   High risk medication use - Discontinued methotrexate in 2020 due to the concern for immunosuppression during the covid-19 pandemic.  She declined restarting MTX at this time.  She has not scheduled a follow up visit with Dr. Sharol Roussel since the beginning of the pandemic.   Raynaud's phenomenon without gangrene: Not currently active.  No digital ulcerations or signs of sclerodactyly noted.  She has intermittent symptoms of raynaud's but her symptoms have been manageable. She will continue taking amlodipine 5 mg by mouth daily.   Chronic right shoulder pain: Improved.  She has good ROM with no discomfort on exam today.  She tries to avoid certain activities which exacerbate her discomfort.   Status post total hip replacement, left: She has good range of motion of the left hip replacement.  No groin pain at this time.   Trochanteric bursitis of both hips: She has tenderness palpation of her bilateral trochanteric bursa on examination today. She was given a handout of exercises to perform.  Offered a referral to PT but she declined at this time.   Primary osteoarthritis of both hands: She presents today with increased pain and stiffness in both hands.  She has been noticing decreased grip strength.  She has PIP and DIP thickening consistent with osteoarthritis of both hands.  CMC joint thickening and tenderness noted bilaterally.  No synovitis was noted.  She was able to make a complete fist bilaterally.  She has tenderness over the DIP joint of the right fourth digit.  We discussed the importance of joint protection and muscle strengthening.  She was given a handout of hand exercises to perform.  We also discussed the use of arthritis compression gloves as well as a CMC  joint brace.  She was given a list of natural anti-inflammatories which were discussed today in detail.  She would like a refill of Celebrex pending lab work today.  A new prescription for Celebrex 200 mg 1 capsule by mouth daily as needed will be sent to the pharmacy.  We discussed the importance of lab monitoring while on Celebrex.  Age-related osteoporosis without current pathological fracture - DEXA updated on 03/04/19: RFN BMD 0.558 with T-score -2.6.  She has been taking a calcium supplement daily but has not been taking a  vitamin D supplement. She is on Prolia 60 mg sq injections every 6 months prescribed by Dr. Dagmar Hait.  Other medical conditions are listed as follows:   History of hypercholesterolemia  History of depression  History of migraine  History of IBS  History of gastroesophageal reflux (GERD)  Orders: Orders Placed This Encounter  Procedures  . CBC with Differential/Platelet  . COMPLETE METABOLIC PANEL WITH GFR   No orders of the defined types were placed in this encounter.    Follow-Up Instructions: Return in about 6 months (around 09/17/2021) for Morphea, Osteoarthritis, Osteoporosis.   Ofilia Neas, PA-C  Note - This record has been created using Dragon software.  Chart creation errors have been sought, but may not always  have been located. Such creation errors do not reflect on  the standard of medical care.

## 2021-03-18 ENCOUNTER — Other Ambulatory Visit: Payer: Self-pay

## 2021-03-18 ENCOUNTER — Encounter: Payer: Self-pay | Admitting: Physician Assistant

## 2021-03-18 ENCOUNTER — Ambulatory Visit: Payer: Medicare Other | Admitting: Physician Assistant

## 2021-03-18 VITALS — BP 110/74 | HR 66 | Ht 63.0 in | Wt 155.4 lb

## 2021-03-18 DIAGNOSIS — M25511 Pain in right shoulder: Secondary | ICD-10-CM | POA: Diagnosis not present

## 2021-03-18 DIAGNOSIS — Z8659 Personal history of other mental and behavioral disorders: Secondary | ICD-10-CM

## 2021-03-18 DIAGNOSIS — M19041 Primary osteoarthritis, right hand: Secondary | ICD-10-CM

## 2021-03-18 DIAGNOSIS — M810 Age-related osteoporosis without current pathological fracture: Secondary | ICD-10-CM

## 2021-03-18 DIAGNOSIS — Z79899 Other long term (current) drug therapy: Secondary | ICD-10-CM | POA: Diagnosis not present

## 2021-03-18 DIAGNOSIS — Z8719 Personal history of other diseases of the digestive system: Secondary | ICD-10-CM

## 2021-03-18 DIAGNOSIS — M7061 Trochanteric bursitis, right hip: Secondary | ICD-10-CM

## 2021-03-18 DIAGNOSIS — G8929 Other chronic pain: Secondary | ICD-10-CM

## 2021-03-18 DIAGNOSIS — M7062 Trochanteric bursitis, left hip: Secondary | ICD-10-CM

## 2021-03-18 DIAGNOSIS — Z8639 Personal history of other endocrine, nutritional and metabolic disease: Secondary | ICD-10-CM

## 2021-03-18 DIAGNOSIS — L940 Localized scleroderma [morphea]: Secondary | ICD-10-CM | POA: Diagnosis not present

## 2021-03-18 DIAGNOSIS — I7300 Raynaud's syndrome without gangrene: Secondary | ICD-10-CM

## 2021-03-18 DIAGNOSIS — Z96642 Presence of left artificial hip joint: Secondary | ICD-10-CM

## 2021-03-18 DIAGNOSIS — Z8669 Personal history of other diseases of the nervous system and sense organs: Secondary | ICD-10-CM

## 2021-03-18 DIAGNOSIS — M19042 Primary osteoarthritis, left hand: Secondary | ICD-10-CM

## 2021-03-18 NOTE — Patient Instructions (Signed)
Hand Exercises Hand exercises can be helpful for almost anyone. These exercises can strengthen the hands, improve flexibility and movement, and increase blood flow to the hands. These results can make work and daily tasks easier. Hand exercises can be especially helpful for people who have joint pain from arthritis or have nerve damage from overuse (carpal tunnel syndrome). These exercises can also help people who have injured a hand. Exercises Most of these hand exercises are gentle stretching and motion exercises. It is usually safe to do them often throughout the day. Warming up your hands before exercise may help to reduce stiffness. You can do this with gentle massage or by placing your hands in warm water for 10-15 minutes. It is normal to feel some stretching, pulling, tightness, or mild discomfort as you begin new exercises. This will gradually improve. Stop an exercise right away if you feel sudden, severe pain or your pain gets worse. Ask your health care provider which exercises are best for you. Knuckle bend or "claw" fist 1. Stand or sit with your arm, hand, and all five fingers pointed straight up. Make sure to keep your wrist straight during the exercise. 2. Gently bend your fingers down toward your palm until the tips of your fingers are touching the top of your palm. Keep your big knuckle straight and just bend the small knuckles in your fingers. 3. Hold this position for __________ seconds. 4. Straighten (extend) your fingers back to the starting position. Repeat this exercise 5-10 times with each hand. Full finger fist 1. Stand or sit with your arm, hand, and all five fingers pointed straight up. Make sure to keep your wrist straight during the exercise. 2. Gently bend your fingers into your palm until the tips of your fingers are touching the middle of your palm. 3. Hold this position for __________ seconds. 4. Extend your fingers back to the starting position, stretching every  joint fully. Repeat this exercise 5-10 times with each hand. Straight fist 1. Stand or sit with your arm, hand, and all five fingers pointed straight up. Make sure to keep your wrist straight during the exercise. 2. Gently bend your fingers at the big knuckle, where your fingers meet your hand, and the middle knuckle. Keep the knuckle at the tips of your fingers straight and try to touch the bottom of your palm. 3. Hold this position for __________ seconds. 4. Extend your fingers back to the starting position, stretching every joint fully. Repeat this exercise 5-10 times with each hand. Tabletop 1. Stand or sit with your arm, hand, and all five fingers pointed straight up. Make sure to keep your wrist straight during the exercise. 2. Gently bend your fingers at the big knuckle, where your fingers meet your hand, as far down as you can while keeping the small knuckles in your fingers straight. Think of forming a tabletop with your fingers. 3. Hold this position for __________ seconds. 4. Extend your fingers back to the starting position, stretching every joint fully. Repeat this exercise 5-10 times with each hand. Finger spread 1. Place your hand flat on a table with your palm facing down. Make sure your wrist stays straight as you do this exercise. 2. Spread your fingers and thumb apart from each other as far as you can until you feel a gentle stretch. Hold this position for __________ seconds. 3. Bring your fingers and thumb tight together again. Hold this position for __________ seconds. Repeat this exercise 5-10 times with each hand.   Making circles 1. Stand or sit with your arm, hand, and all five fingers pointed straight up. Make sure to keep your wrist straight during the exercise. 2. Make a circle by touching the tip of your thumb to the tip of your index finger. 3. Hold for __________ seconds. Then open your hand wide. 4. Repeat this motion with your thumb and each finger on your  hand. Repeat this exercise 5-10 times with each hand. Thumb motion 1. Sit with your forearm resting on a table and your wrist straight. Your thumb should be facing up toward the ceiling. Keep your fingers relaxed as you move your thumb. 2. Lift your thumb up as high as you can toward the ceiling. Hold for __________ seconds. 3. Bend your thumb across your palm as far as you can, reaching the tip of your thumb for the small finger (pinkie) side of your palm. Hold for __________ seconds. Repeat this exercise 5-10 times with each hand. Grip strengthening 1. Hold a stress ball or other soft ball in the middle of your hand. 2. Slowly increase the pressure, squeezing the ball as much as you can without causing pain. Think of bringing the tips of your fingers into the middle of your palm. All of your finger joints should bend when doing this exercise. 3. Hold your squeeze for __________ seconds, then relax. Repeat this exercise 5-10 times with each hand.   Contact a health care provider if:  Your hand pain or discomfort gets much worse when you do an exercise.  Your hand pain or discomfort does not improve within 2 hours after you exercise. If you have any of these problems, stop doing these exercises right away. Do not do them again unless your health care provider says that you can. Get help right away if:  You develop sudden, severe hand pain or swelling. If this happens, stop doing these exercises right away. Do not do them again unless your health care provider says that you can. This information is not intended to replace advice given to you by your health care provider. Make sure you discuss any questions you have with your health care provider. Document Revised: 03/21/2019 Document Reviewed: 11/29/2018 Elsevier Patient Education  2021 [place].   Hip Bursitis Rehab Ask your health care provider which exercises are safe for you. Do exercises exactly as told by your health care  provider and adjust them as directed. It is normal to feel mild stretching, pulling, tightness, or discomfort as you do these exercises. Stop right away if you feel sudden pain or your pain gets worse. Do not begin these exercises until told by your health care provider. Stretching exercise This exercise warms up your muscles and joints and improves the movement and flexibility of your hip. This exercise also helps to relieve pain and stiffness. Iliotibial band stretch An iliotibial band is a strong band of muscle tissue that runs from the outer side of your hip to the outer side of your thigh and knee. 1. Lie on your side with your left / right leg in the top position. 2. Bend your left / right knee and grab your ankle. Stretch out your bottom arm to help you balance. 3. Slowly bring your knee back so your thigh is behind your body. 4. Slowly lower your knee toward the floor until you feel a gentle stretch on the outside of your left / right thigh. If you do not feel a stretch and your knee will not fall  farther, place the heel of your other foot on top of your knee and pull your knee down toward the floor with your foot. 5. Hold this position for __________ seconds. 6. Slowly return to the starting position. Repeat __________ times. Complete this exercise __________ times a day.   Strengthening exercises These exercises build strength and endurance in your hip and pelvis. Endurance is the ability to use your muscles for a long time, even after they get tired. Bridge This exercise strengthens the muscles that move your thigh backward (hip extensors). 1. Lie on your back on a firm surface with your knees bent and your feet flat on the floor. 2. Tighten your buttocks muscles and lift your buttocks off the floor until your trunk is level with your thighs. ? Do not arch your back. ? You should feel the muscles working in your buttocks and the back of your thighs. If you do not feel these muscles,  slide your feet 1-2 inches (2.5-5 cm) farther away from your buttocks. ? If this exercise is too easy, try doing it with your arms crossed over your chest. 3. Hold this position for __________ seconds. 4. Slowly lower your hips to the starting position. 5. Let your muscles relax completely after each repetition. Repeat __________ times. Complete this exercise __________ times a day.   Squats This exercise strengthens the muscles in front of your thigh and knee (quadriceps). 1. Stand in front of a table, with your feet and knees pointing straight ahead. You may rest your hands on the table for balance but not for support. 2. Slowly bend your knees and lower your hips like you are going to sit in a chair. ? Keep your weight over your heels, not over your toes. ? Keep your lower legs upright so they are parallel with the table legs. ? Do not let your hips go lower than your knees. ? Do not bend lower than told by your health care provider. ? If your hip pain increases, do not bend as low. 3. Hold the squat position for __________ seconds. 4. Slowly push with your legs to return to standing. Do not use your hands to pull yourself to standing. Repeat __________ times. Complete this exercise __________ times a day. Hip hike 1. Stand sideways on a bottom step. Stand on your left / right leg with your other foot unsupported next to the step. You can hold on to the railing or wall for balance if needed. 2. Keep your knees straight and your torso square. Then lift your left / right hip up toward the ceiling. 3. Hold this position for __________ seconds. 4. Slowly let your left / right hip lower toward the floor, past the starting position. Your foot should get closer to the floor. Do not lean or bend your knees. Repeat __________ times. Complete this exercise __________ times a day. Single leg stand 1. Without shoes, stand near a railing or in a doorway. You may hold on to the railing or door frame as  needed for balance. 2. Squeeze your left / right buttock muscles, then lift up your other foot. ? Do not let your left / right hip push out to the side. ? It is helpful to stand in front of a mirror for this exercise so you can watch your hip. 3. Hold this position for __________ seconds. Repeat __________ times. Complete this exercise __________ times a day. This information is not intended to replace advice given to you by your  health care provider. Make sure you discuss any questions you have with your health care provider. Document Revised: 03/25/2019 Document Reviewed: 03/25/2019 Elsevier Patient Education  [place].

## 2021-03-19 ENCOUNTER — Other Ambulatory Visit: Payer: Self-pay

## 2021-03-19 LAB — CBC WITH DIFFERENTIAL/PLATELET
Absolute Monocytes: 645 cells/uL (ref 200–950)
Basophils Absolute: 52 cells/uL (ref 0–200)
Basophils Relative: 1 %
Eosinophils Absolute: 130 cells/uL (ref 15–500)
Eosinophils Relative: 2.5 %
HCT: 38 % (ref 35.0–45.0)
Hemoglobin: 12.6 g/dL (ref 11.7–15.5)
Lymphs Abs: 1960 cells/uL (ref 850–3900)
MCH: 29.9 pg (ref 27.0–33.0)
MCHC: 33.2 g/dL (ref 32.0–36.0)
MCV: 90.3 fL (ref 80.0–100.0)
MPV: 9.8 fL (ref 7.5–12.5)
Monocytes Relative: 12.4 %
Neutro Abs: 2413 cells/uL (ref 1500–7800)
Neutrophils Relative %: 46.4 %
Platelets: 228 10*3/uL (ref 140–400)
RBC: 4.21 10*6/uL (ref 3.80–5.10)
RDW: 12.8 % (ref 11.0–15.0)
Total Lymphocyte: 37.7 %
WBC: 5.2 10*3/uL (ref 3.8–10.8)

## 2021-03-19 LAB — COMPLETE METABOLIC PANEL WITH GFR
AG Ratio: 1.9 (calc) (ref 1.0–2.5)
ALT: 20 U/L (ref 6–29)
AST: 27 U/L (ref 10–35)
Albumin: 4.1 g/dL (ref 3.6–5.1)
Alkaline phosphatase (APISO): 50 U/L (ref 37–153)
BUN: 16 mg/dL (ref 7–25)
CO2: 28 mmol/L (ref 20–32)
Calcium: 9.4 mg/dL (ref 8.6–10.4)
Chloride: 104 mmol/L (ref 98–110)
Creat: 0.93 mg/dL (ref 0.60–0.93)
GFR, Est African American: 71 mL/min/{1.73_m2} (ref >=60)
GFR, Est Non African American: 61 mL/min/{1.73_m2} (ref >=60)
Globulin: 2.2 g/dL (calc) (ref 1.9–3.7)
Glucose, Bld: 82 mg/dL (ref 65–99)
Potassium: 4.7 mmol/L (ref 3.5–5.3)
Sodium: 139 mmol/L (ref 135–146)
Total Bilirubin: 0.3 mg/dL (ref 0.2–1.2)
Total Protein: 6.3 g/dL (ref 6.1–8.1)

## 2021-03-19 MED ORDER — CELECOXIB 200 MG PO CAPS: 200.0000 mg | ORAL_CAPSULE | Freq: Every day | ORAL | 0 refills | Status: DC | PRN

## 2021-03-19 NOTE — Progress Notes (Signed)
CBC and CMP WNL

## 2021-03-19 NOTE — Telephone Encounter (Signed)
celebrex pending lab results.   Labs: 03/18/2021 CBC and CMP WNL  Please review prescription and send to the pharmacy.

## 2021-04-05 ENCOUNTER — Ambulatory Visit: Payer: Medicare Other | Admitting: Gastroenterology

## 2021-04-05 ENCOUNTER — Encounter: Payer: Self-pay | Admitting: Gastroenterology

## 2021-04-05 VITALS — BP 110/70 | HR 80 | Ht 63.0 in | Wt 153.8 lb

## 2021-04-05 DIAGNOSIS — K2100 Gastro-esophageal reflux disease with esophagitis, without bleeding: Secondary | ICD-10-CM | POA: Diagnosis not present

## 2021-04-05 MED ORDER — PANTOPRAZOLE SODIUM 40 MG PO TBEC: 1.0000 | DELAYED_RELEASE_TABLET | Freq: Two times a day (BID) | ORAL | 1 refills | Status: DC

## 2021-04-05 NOTE — Patient Instructions (Signed)
If you are age 74 or older, your body mass index should be between 23-30. Your Body mass index is 27.24 kg/m. If this is out of the aforementioned range listed, please consider follow up with your Primary Care Provider.  If you are age 50 or younger, your body mass index should be between 19-25. Your Body mass index is 27.24 kg/m. If this is out of the aformentioned range listed, please consider follow up with your Primary Care Provider.   It was a pleasure to see you today!  Dr. Loletha Carrow

## 2021-04-05 NOTE — Progress Notes (Signed)
[facility] Progress Note  Chief Complaint: GERD with esophagitis  Subjective  History: There is last seen on 09/15/2020 for office visit including the following: "Heather Lindsey says she is doing fairly well overall.  She is still bothered by frequent heartburn that sometimes might occur just after drinking water.  She takes Protonix on occasion, and when she does not want to take that, she might take multiple Tums in a day.  She has bloating and gas that she is attributed to her IBS.  Regurgitation occurs while bending over working in the garden. She denies dysphagia or odynophagia"  She also has chronic stable IBS symptoms with bloating and intermittent crampy left lower quadrant pain.  I recommended she consider pH and impedance testing off acid suppression therapy in order to quantify reflux and determine best long-term treatment.  She preferred a refill of Protonix and said she would consider this suggestion further. _______________________________  Heather Lindsey continues to have persistent pyrosis in the mid upper chest which she says is "no better or worse" than when I last saw her.  She takes pantoprazole 40 mg every morning, and often the breakthrough symptoms are in the evening or overnight hours.  She denies dysphagia or odynophagia, loss of appetite or weight loss.  She still intermittently has some left lower quadrant crampy pain.  ROS: Cardiovascular:  no chest pain Respiratory: no dyspnea  The patient's Past Medical, Family and Social History were reviewed and are on file in the EMR.  Objective:  Med list reviewed  Current Outpatient Medications:  .  acetaminophen (TYLENOL) 500 MG tablet, Take 1,000 mg by mouth every 6 (six) hours as needed for mild pain, moderate pain or headache., Disp: , Rfl:  .  ALPRAZolam (XANAX) 0.5 MG tablet, Take 0.25 mg by mouth daily as needed for anxiety or sleep. , Disp: , Rfl:  .  amLODipine (NORVASC) 5 MG tablet, Once a day po, watch for puffy  feet ! (Patient taking differently: Take 5 mg by mouth daily.), Disp: 90 tablet, Rfl: 3 .  baclofen (LIORESAL) 10 MG tablet, TAKE 1 TABLET BY MOUTH AT 8 AM AND 2 PM AS NEEDED (Patient taking differently: TAKE 1 TABLET BY MOUTH AT BEDTIME), Disp: 60 tablet, Rfl: 0 .  calcium carbonate (TUMS - DOSED IN MG ELEMENTAL CALCIUM) 500 MG chewable tablet, Chew 1 tablet by mouth as needed for indigestion or heartburn., Disp: , Rfl:  .  celecoxib (CELEBREX) 200 MG capsule, Take 1 capsule (200 mg total) by mouth daily as needed. (Patient taking differently: Take 200 mg by mouth daily.), Disp: 30 capsule, Rfl: 0 .  denosumab (PROLIA) 60 MG/ML SOSY injection, Inject 60 mg into the skin every 6 (six) months., Disp: , Rfl:  .  docusate sodium (COLACE) 100 MG capsule, Take 1 capsule (100 mg total) by mouth 2 (two) times daily. (Patient taking differently: Take 100 mg by mouth as needed.), Disp: 60 capsule, Rfl: 0 .  fluticasone (FLONASE) 50 MCG/ACT nasal spray, Place 1 spray into both nostrils daily., Disp: , Rfl:  .  hyoscyamine (LEVSIN) 0.125 MG tablet, Take 0.125 mg by mouth every 4 (four) hours as needed for bladder spasms or cramping. , Disp: , Rfl:  .  loratadine (CLARITIN) 10 MG tablet, Take 10 mg by mouth daily., Disp: , Rfl:  .  Multiple Vitamins-Minerals (CENTRUM PO), Take 1 capsule by mouth daily., Disp: , Rfl:  .  venlafaxine (EFFEXOR) 75 MG tablet, Take 75 mg by mouth daily after  breakfast. , Disp: , Rfl:  .  pantoprazole (PROTONIX) 40 MG tablet, Take 1 tablet (40 mg total) by mouth 2 (two) times daily before a meal., Disp: 60 tablet, Rfl: 1   Vital signs in last 24 hrs: Vitals:   04/05/21 1352  BP: 110/70  Pulse: 80   Wt Readings from Last 3 Encounters:  04/05/21 153 lb 12.8 oz (69.8 kg)  03/18/21 155 lb 6.4 oz (70.5 kg)  09/17/20 154 lb 6.4 oz (70 kg)    Physical Exam  Well-appearing, normal vocal quality  HEENT: sclera anicteric, oral mucosa moist without lesions  Neck: supple, no  thyromegaly, JVD or lymphadenopathy  Cardiac: RRR without murmurs, S1S2 heard, no peripheral edema  Pulm: clear to auscultation bilaterally, normal RR and effort noted  Abdomen: soft, no tenderness, with active bowel sounds. No guarding or palpable hepatosplenomegaly.  Skin; warm and dry, no jaundice or rash  Labs:   ___________________________________________ Radiologic studies:   ____________________________________________ Other:   _____________________________________________ Assessment & Plan  Assessment: Encounter Diagnosis  Name Primary?  . Gastroesophageal reflux disease with esophagitis without hemorrhage Yes   On last endoscopy 4 years ago she had a somewhat patulous LES and mild reflux esophagitis.  We discussed GERD, limitation of medications, possibility of anatomic considerations, lack of Barrett's esophagus for her (she was concerned about risk of esophageal cancer), and how sometimes degree of symptoms do not always correlate with amount of reflux.  I still think she would benefit from pH and impedance testing while on acid suppression medicine.  If that were done and showed little reflux but with persistent symptoms, she might be better with low-dose amitriptyline for functional component rather than endoscopic or surgical therapy.  I offered her a least a temporary increase of the pantoprazole to 40 mg twice daily.  Several weeks to that to be enough to see if it will improve symptoms.  She wanted to go that route, so new prescription was sent.  If that does not help, then she believes she would likely pursue the pH and impedance testing. (She agreed to call or send a portal message in several weeks with an update)  20 minutes were spent on this encounter (including chart review, history/exam, counseling/coordination of care, and documentation) > 50% of that time was spent on counseling and coordination of care.  Topics discussed included: See above.  Heather Lindsey

## 2021-04-12 ENCOUNTER — Other Ambulatory Visit (HOSPITAL_COMMUNITY): Payer: Self-pay | Admitting: *Deleted

## 2021-04-12 ENCOUNTER — Other Ambulatory Visit: Payer: Self-pay | Admitting: Physician Assistant

## 2021-04-12 NOTE — Telephone Encounter (Signed)
Next Visit:09/14/2021  Last Visit: 03/18/2021  Last Fill: 03/19/2021  Dx: Primary osteoarthritis of both hands  Current Dose per office note on 03/18/2021, Celebrex 200 mg 1 capsule by mouth daily as needed   Okay to refill Celebrex?

## 2021-04-13 ENCOUNTER — Ambulatory Visit (HOSPITAL_COMMUNITY)
Admission: RE | Admit: 2021-04-13 | Discharge: 2021-04-13 | Disposition: A | Discharge status: Home / Self Care | Payer: Medicare Other | Source: Ambulatory Visit | Attending: Internal Medicine | Admitting: Internal Medicine

## 2021-04-13 ENCOUNTER — Other Ambulatory Visit: Payer: Self-pay

## 2021-04-13 DIAGNOSIS — M810 Age-related osteoporosis without current pathological fracture: Secondary | ICD-10-CM | POA: Diagnosis not present

## 2021-04-13 MED ORDER — DENOSUMAB 60 MG/ML ~~LOC~~ SOSY
60.0000 mg | PREFILLED_SYRINGE | Freq: Once | SUBCUTANEOUS | Status: AC
  Administered 2021-04-13: 60 mg via SUBCUTANEOUS

## 2021-04-13 MED ORDER — DENOSUMAB 60 MG/ML ~~LOC~~ SOSY
PREFILLED_SYRINGE | SUBCUTANEOUS | Status: AC
  Filled 2021-04-13: qty 1

## 2021-05-01 ENCOUNTER — Other Ambulatory Visit: Payer: Self-pay | Admitting: Rheumatology

## 2021-05-03 NOTE — Telephone Encounter (Signed)
Next Visit: 09/14/2021  Last Visit: 03/18/2021  Last Fill: 04/12/2021  DX: Primary osteoarthritis of both hands  Current Dose per office note 03/18/2021, Celebrex 200 mg 1 capsule by mouth daily as needed   Labs: 03/18/2021, CBC and CMP WNL  Okay to refill Celebrex?

## 2021-05-27 ENCOUNTER — Other Ambulatory Visit: Payer: Self-pay | Admitting: Rheumatology

## 2021-05-27 ENCOUNTER — Other Ambulatory Visit: Payer: Self-pay | Admitting: Gastroenterology

## 2021-05-27 NOTE — Telephone Encounter (Signed)
Next Visit: 09/14/2021   Last Visit: 03/18/2021  Last Fill: 05/03/2021 Celebrex, 12/07/2020 Baclofen   DX: Primary osteoarthritis of both hands   Current Dose per office note 03/18/2021, Celebrex 200 mg 1 capsule by mouth daily as needed. Baclofen not discussed   Labs: 03/18/2021, CBC and CMP WNL   Okay to refill Celebrex and Baclofen?

## 2021-05-31 ENCOUNTER — Other Ambulatory Visit: Payer: Self-pay | Admitting: Gastroenterology

## 2021-07-18 ENCOUNTER — Other Ambulatory Visit: Payer: Self-pay | Admitting: Rheumatology

## 2021-07-19 NOTE — Telephone Encounter (Signed)
Next Visit: 09/14/2021   Last Visit: 03/18/2021   Last Fill: 05/27/2021  DX: Primary osteoarthritis of both hands   Current Dose per office note 03/18/2021, Celebrex 200 mg 1 capsule by mouth daily as needed.  Labs: 03/18/2021, CBC and CMP WNL  Okay to refill Celebrex?

## 2021-08-31 NOTE — Progress Notes (Signed)
Office Visit Note  Patient: Heather Lindsey             Date of Birth: 1946-08-07           MRN: [id number]             PCP: Prince Solian, MD Referring: Prince Solian, MD Visit Date: 09/14/2021 Occupation: @GUAROCC @  Subjective:  Other (Bilateral hand stiffness, right hip pain and right foot pain )   History of Present Illness: Heather Lindsey is a 75 y.o. female history of morphea, osteoarthritis and osteoporosis.  She has not noticed any change in her morphea rash.  She continues to have some pain and stiffness in her hands, right hip and trochanteric area.  She states for the last 2 weeks she has been having pain and discomfort in her right foot which she describes over her MTPs.  There is no history of trauma.  She describes some lower back pain which has been chronic.  There is no history of shortness of breath, palpitations.  She states the Raynauds is not very active currently.  She came off amlodipine as it was causing some pedal edema.  Although she may restart it during the winter months.  Activities of Daily Living:  Patient reports morning stiffness for several hours.   Patient Reports nocturnal pain.  Difficulty dressing/grooming: Denies Difficulty climbing stairs: Reports Difficulty getting out of chair: Denies Difficulty using hands for taps, buttons, cutlery, and/or writing: Reports  Review of Systems  Constitutional:  Negative for fatigue.  HENT:  Positive for mouth dryness. Negative for mouth sores and nose dryness.   Eyes:  Positive for dryness. Negative for pain and itching.  Respiratory:  Negative for shortness of breath and difficulty breathing.   Cardiovascular:  Negative for chest pain and palpitations.  Gastrointestinal:  Negative for blood in stool, constipation and diarrhea.  Endocrine: Negative for increased urination.  Genitourinary:  Negative for difficulty urinating.  Musculoskeletal:  Positive for joint pain, joint pain, myalgias, morning stiffness and  myalgias. Negative for joint swelling and muscle tenderness.  Skin:  Negative for color change, rash, redness and sensitivity to sunlight.  Allergic/Immunologic: Negative for susceptible to infections.  Neurological:  Negative for dizziness, numbness, headaches, memory loss and weakness.  Hematological:  Positive for bruising/bleeding tendency.  Psychiatric/Behavioral:  Negative for confusion.    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/2014   HYPERCHOLESTEROLEMIA 11/18/2008   Depression 11/18/2008   IRRITABLE BOWEL SYNDROME 11/18/2008   Morphea 11/18/2008   Primary osteoarthritis of both hands 11/18/2008   Age related osteoporosis 11/18/2008   MIGRAINES, HX OF 11/18/2008    Past Medical History:  Diagnosis Date   Anxiety    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   Brain bleed ([facility])    Circumscribed scleroderma    Depression    Diverticulosis    GERD (gastroesophageal reflux disease)    Hyperlipidemia    IBS (irritable bowel syndrome)    Memory loss    Migraines    Osteoarthritis    Osteoporosis  Stroke [facility]) 2015    Family History  Problem Relation Age of Onset   Heart disease Father    Liver cancer Paternal Aunt    Prostate cancer Paternal Grandfather    Colon cancer Neg Hx    Stomach cancer Neg Hx    Pancreatic cancer Neg Hx    Esophageal cancer Neg Hx    Past Surgical History:  Procedure Laterality Date   ANKLE FRACTURE SURGERY Left    TOTAL HIP ARTHROPLASTY Left  01/06/2019   Procedure: TOTAL HIP ARTHROPLASTY ANTERIOR APPROACH;  Surgeon: Dorna Leitz, MD;  Location: [facility];  Service: Orthopedics;  Laterality: Left;   TUBAL LIGATION     Social History   Social History Narrative   Daily caffeine, 2 glasses daily,   Right handed, Married, 2 sons. Retired. College.   Immunization History  Administered Date(s) Administered   PFIZER(Purple Top)SARS-COV-2 Vaccination 12/28/2019, 01/18/2020, 08/07/2020   Zoster Recombinat (Shingrix) 03/01/2018, 06/17/2018     Objective: Vital Signs: BP 124/85 (BP Location: Left Arm, Patient Position: Sitting, Cuff Size: Normal)   Pulse 69   Ht 5\' 4"  (1.626 m)   Wt 161 lb 6.4 oz (73.2 kg)   BMI 27.70 kg/m    Physical Exam Vitals and nursing note reviewed.  Constitutional:      Appearance: She is well-developed.  HENT:     Head: Normocephalic and atraumatic.  Eyes:     Conjunctiva/sclera: Conjunctivae normal.  Cardiovascular:     Rate and Rhythm: Normal rate and regular rhythm.     Heart sounds: Normal heart sounds.  Pulmonary:     Effort: Pulmonary effort is normal.     Breath sounds: Normal breath sounds.  Abdominal:     General: Bowel sounds are normal.     Palpations: Abdomen is soft.  Musculoskeletal:     Cervical back: Normal range of motion.  Lymphadenopathy:     Cervical: No cervical adenopathy.  Skin:    General: Skin is warm and dry.     Capillary Refill: Capillary refill takes less than 2 seconds.     Comments: She has scattered morphea lesions on her face, neck, trunk and extremities.  No nailbed capillary changes, sclerodactyly or telangiectasias were noted.  Neurological:     Mental Status: She is alert and oriented to person, place, and time.  Psychiatric:        Behavior: Behavior normal.     Musculoskeletal Exam: C-spine was in good range of motion.  She had some discomfort range of motion of her lumbar spine.  Shoulder joints with good range of motion.  She discomfort with range of  motion of her left shoulder joint.  Elbow joints, wrist joints, MCPs PIPs and DIPs with good range of motion with no synovitis.  Hip joints with good range of motion.  She had tenderness on palpation of bilateral trochanteric bursa.  Knee joints in good range of motion without any warmth swelling or effusion.  She had tenderness on palpation of right foot MTPs without synovitis.  CDAI Exam: CDAI Score: -- Patient Global: --; Provider Global: -- Swollen: --; Tender: -- Joint Exam 09/14/2021   No joint exam has been documented for this visit   There is currently no information documented on the homunculus. Go to the Rheumatology activity and complete the homunculus joint exam.  Investigation: No additional findings.  Imaging: No results found.  Recent Labs: Lab Results  Component Value Date   WBC 5.2 03/18/2021   HGB 12.6 03/18/2021  PLT 228 03/18/2021   NA 139 03/18/2021   K 4.7 03/18/2021   CL 104 03/18/2021   CO2 28 03/18/2021   GLUCOSE 82 03/18/2021   BUN 16 03/18/2021   CREATININE 0.93 03/18/2021   BILITOT 0.3 03/18/2021   AST 27 03/18/2021   ALT 20 03/18/2021   PROT 6.3 03/18/2021   CALCIUM 9.4 03/18/2021   GFRAA 71 03/18/2021    Speciality Comments: No specialty comments available.  Procedures:  No procedures performed Allergies: Patient has no known allergies.   Assessment / Plan:     Visit Diagnoses: Morphea - Previously followed by Dr. Sharol Roussel at [facility].  She has not noticed any new lesions.  Although it is difficult to assess that she has extensive morphea lesions.  High risk medication use - Discontinued methotrexate in 2020 due to the concern for immunosuppression during the covid-19 pandemic.   Raynaud's phenomenon without gangrene -she is holding amlodipine 5 mg by mouth daily as the Raynauds is not very active and she thought it was contributing to some pedal edema.  She plans on restarting amlodipine during the winter months.  Pain  in right foot -she has been asked experiencing pain and discomfort in her right foot over all of her MTPs except great toe for the last 2 weeks.  She had tenderness on palpation over the second, third, fourth and fifth MTPs with no synovitis.  Plan: XR Foot Complete Right.  X-rays were consistent with osteoarthritis.  No fracture was noted.  Proper fitting shoes with arch support were advised.  Chronic right shoulder pain-she has chronic discomfort.  She has not noticed any worsening of the pain.  Primary osteoarthritis of both hands - Celebrex 200 mg 1 capsule by mouth daily as needed.  I will check CBC with differential and CMP with GFR today.  Status post total hip replacement, left-she had good range of motion without discomfort.  Trochanteric bursitis of both hips-she continues to have some discomfort in bilateral trochanteric bursa.  A handout 90-minute stretches was given.  Age-related osteoporosis without current pathological fracture - DEXA updated on 03/04/19: RFN BMD 0.558 with T-score -2.6.  She is on Prolia 60 mg sq injections every 6 months prescribed by Dr. Dagmar Hait.  History of hypercholesterolemia  History of gastroesophageal reflux (GERD)  History of IBS  History of migraine  History of depression  Orders: Orders Placed This Encounter  Procedures   XR Foot Complete Right   CBC with Differential/Platelet   COMPLETE METABOLIC PANEL WITH GFR    No orders of the defined types were placed in this encounter.   Follow-Up Instructions: Return in about 6 months (around 03/15/2022) for Morphea, OA.   Bo Merino, MD  Note - This record has been created using Editor, commissioning.  Chart creation errors have been sought, but may not always  have been located. Such creation errors do not reflect on  the standard of medical care.

## 2021-09-14 ENCOUNTER — Other Ambulatory Visit: Payer: Self-pay

## 2021-09-14 ENCOUNTER — Ambulatory Visit: Payer: Medicare Other | Admitting: Rheumatology

## 2021-09-14 ENCOUNTER — Ambulatory Visit: Payer: Self-pay

## 2021-09-14 ENCOUNTER — Encounter: Payer: Self-pay | Admitting: Rheumatology

## 2021-09-14 VITALS — BP 124/85 | HR 69 | Ht 64.0 in | Wt 161.4 lb

## 2021-09-14 DIAGNOSIS — I7300 Raynaud's syndrome without gangrene: Secondary | ICD-10-CM

## 2021-09-14 DIAGNOSIS — M19042 Primary osteoarthritis, left hand: Secondary | ICD-10-CM

## 2021-09-14 DIAGNOSIS — M79671 Pain in right foot: Secondary | ICD-10-CM

## 2021-09-14 DIAGNOSIS — G8929 Other chronic pain: Secondary | ICD-10-CM

## 2021-09-14 DIAGNOSIS — M7061 Trochanteric bursitis, right hip: Secondary | ICD-10-CM

## 2021-09-14 DIAGNOSIS — L940 Localized scleroderma [morphea]: Secondary | ICD-10-CM | POA: Diagnosis not present

## 2021-09-14 DIAGNOSIS — M25511 Pain in right shoulder: Secondary | ICD-10-CM

## 2021-09-14 DIAGNOSIS — Z79899 Other long term (current) drug therapy: Secondary | ICD-10-CM

## 2021-09-14 DIAGNOSIS — M810 Age-related osteoporosis without current pathological fracture: Secondary | ICD-10-CM

## 2021-09-14 DIAGNOSIS — M7062 Trochanteric bursitis, left hip: Secondary | ICD-10-CM

## 2021-09-14 DIAGNOSIS — Z96642 Presence of left artificial hip joint: Secondary | ICD-10-CM

## 2021-09-14 DIAGNOSIS — Z8659 Personal history of other mental and behavioral disorders: Secondary | ICD-10-CM

## 2021-09-14 DIAGNOSIS — Z8639 Personal history of other endocrine, nutritional and metabolic disease: Secondary | ICD-10-CM

## 2021-09-14 DIAGNOSIS — Z8719 Personal history of other diseases of the digestive system: Secondary | ICD-10-CM

## 2021-09-14 DIAGNOSIS — Z8669 Personal history of other diseases of the nervous system and sense organs: Secondary | ICD-10-CM

## 2021-09-14 DIAGNOSIS — M19041 Primary osteoarthritis, right hand: Secondary | ICD-10-CM

## 2021-09-14 NOTE — Patient Instructions (Signed)
Iliotibial Band Syndrome Rehab Ask your health care provider which exercises are safe for you. Do exercises exactly as told by your health care provider and adjust them as directed. It is normal to feel mild stretching, pulling, tightness, or discomfort as you do these exercises. Stop right away if you feel sudden pain or your pain gets significantly worse. Do not begin these exercises until told by your health care provider. Stretching and range-of-motion exercises These exercises warm up your muscles and joints and improve the movement andflexibility of your hip and pelvis. Quadriceps stretch, prone  Lie on your abdomen (prone position) on a firm surface, such as a bed or padded floor. Bend your left / right knee and reach back to hold your ankle or pant leg. If you cannot reach your ankle or pant leg, loop a belt around your foot and grab the belt instead. Gently pull your heel toward your buttocks. Your knee should not slide out to the side. You should feel a stretch in the front of your thigh and knee (quadriceps). Hold this position for __________ seconds. Repeat __________ times. Complete this exercise __________ times a day. Iliotibial band stretch An iliotibial band is a strong band of muscle tissue that runs from the outer side of your hip to the outer side of your thigh and knee. Lie on your side with your left / right leg in the top position. Bend both of your knees and grab your left / right ankle. Stretch out your bottom arm to help you balance. Slowly bring your top knee back so your thigh goes behind your trunk. Slowly lower your top leg toward the floor until you feel a gentle stretch on the outside of your left / right hip and thigh. If you do not feel a stretch and your knee will not fall farther, place the heel of your other foot on top of your knee and pull your knee down toward the floor with your foot. Hold this position for __________ seconds. Repeat __________ times.  Complete this exercise __________ times a day. Strengthening exercises These exercises build strength and endurance in your hip and pelvis. Enduranceis the ability to use your muscles for a long time, even after they get tired. Straight leg raises, side-lying This exercise strengthens the muscles that rotate the leg at the hip and move it away from your body (hip abductors). Lie on your side with your left / right leg in the top position. Lie so your head, shoulder, hip, and knee line up. You may bend your bottom knee to help you balance. Roll your hips slightly forward so your hips are stacked directly over each other and your left / right knee is facing forward. Tense the muscles in your outer thigh and lift your top leg 4-6 inches (10-15 cm). Hold this position for __________ seconds. Slowly lower your leg to return to the starting position. Let your muscles relax completely before doing another repetition. Repeat __________ times. Complete this exercise __________ times a day. Leg raises, prone This exercise strengthens the muscles that move the hips backward (hip extensors). Lie on your abdomen (prone position) on your bed or a firm surface. You can put a pillow under your hips if that is more comfortable for your lower back. Bend your left / right knee so your foot is straight up in the air. Squeeze your buttocks muscles and lift your left / right thigh off the bed. Do not let your back arch. Tense your thigh   muscle as hard as you can without increasing any knee pain. Hold this position for __________ seconds. Slowly lower your leg to return to the starting position and allow it to relax completely. Repeat __________ times. Complete this exercise __________ times a day. Hip hike Stand sideways on a bottom step. Stand on your left / right leg with your other foot unsupported next to the step. You can hold on to a railing or wall for balance if needed. Keep your knees straight and your  torso square. Then lift your left / right hip up toward the ceiling. Slowly let your left / right hip lower toward the floor, past the starting position. Your foot should get closer to the floor. Do not lean or bend your knees. Repeat __________ times. Complete this exercise __________ times a day. This information is not intended to replace advice given to you by your health care provider. Make sure you discuss any questions you have with your healthcare provider. Document Revised: 02/05/2020 Document Reviewed: 02/05/2020 Elsevier Patient Education  2022 [employer]  

## 2021-09-15 LAB — CBC WITH DIFFERENTIAL/PLATELET
Absolute Monocytes: 706 cells/uL (ref 200–950)
Basophils Absolute: 38 cells/uL (ref 0–200)
Basophils Relative: 0.6 %
Eosinophils Absolute: 88 cells/uL (ref 15–500)
Eosinophils Relative: 1.4 %
HCT: 38.2 % (ref 35.0–45.0)
Hemoglobin: 12.8 g/dL (ref 11.7–15.5)
Lymphs Abs: 2129 cells/uL (ref 850–3900)
MCH: 30.1 pg (ref 27.0–33.0)
MCHC: 33.5 g/dL (ref 32.0–36.0)
MCV: 89.9 fL (ref 80.0–100.0)
MPV: 9.6 fL (ref 7.5–12.5)
Monocytes Relative: 11.2 %
Neutro Abs: 3339 cells/uL (ref 1500–7800)
Neutrophils Relative %: 53 %
Platelets: 264 10*3/uL (ref 140–400)
RBC: 4.25 10*6/uL (ref 3.80–5.10)
RDW: 12.8 % (ref 11.0–15.0)
Total Lymphocyte: 33.8 %
WBC: 6.3 10*3/uL (ref 3.8–10.8)

## 2021-09-15 LAB — COMPLETE METABOLIC PANEL WITH GFR
AG Ratio: 1.7 (calc) (ref 1.0–2.5)
ALT: 13 U/L (ref 6–29)
AST: 18 U/L (ref 10–35)
Albumin: 4.2 g/dL (ref 3.6–5.1)
Alkaline phosphatase (APISO): 37 U/L (ref 37–153)
BUN: 18 mg/dL (ref 7–25)
CO2: 26 mmol/L (ref 20–32)
Calcium: 9.5 mg/dL (ref 8.6–10.4)
Chloride: 103 mmol/L (ref 98–110)
Creat: 1 mg/dL (ref 0.60–1.00)
Globulin: 2.5 g/dL (calc) (ref 1.9–3.7)
Glucose, Bld: 87 mg/dL (ref 65–99)
Potassium: 4.9 mmol/L (ref 3.5–5.3)
Sodium: 138 mmol/L (ref 135–146)
Total Bilirubin: 0.3 mg/dL (ref 0.2–1.2)
Total Protein: 6.7 g/dL (ref 6.1–8.1)
eGFR: 59 mL/min/{1.73_m2} — ABNORMAL LOW (ref >=60)

## 2021-09-15 NOTE — Progress Notes (Signed)
CBC is normal, renal function has decreased.  Patient should discontinue Celebrex and avoid all NSAIDs.  She may use Tylenol instead.

## 2021-09-16 ENCOUNTER — Telehealth: Payer: Self-pay | Admitting: *Deleted

## 2021-09-16 NOTE — Telephone Encounter (Signed)
Normal GFR for age should be more than 6.  Her GFR is borderline low.  The only medication which will affect her kidney filtration rate is Celebrex.  The recommendations will be to stop Celebrex.  Morphea will not cause low GFR.  Nephrology referral is not necessary at this time.  She may discuss low GFR with her PCP.

## 2021-09-16 NOTE — Telephone Encounter (Signed)
Attempted to contact the patient and left message for patient to call the office.  

## 2021-09-16 NOTE — Telephone Encounter (Signed)
Patient contacted the office to ask a few questions regarding her lab work. Patient was advised yesterday that her labs showed her GFR slightly decreased at 59. Patient would like to know:  1. Was is the normal GFR for a 75 year old white female? 2. How serious is this and does she need an evaluation by nephrology?  3. Is this related to the morphea?

## 2021-09-17 ENCOUNTER — Other Ambulatory Visit: Payer: Self-pay

## 2021-09-17 ENCOUNTER — Ambulatory Visit: Payer: Medicare Other | Admitting: Gastroenterology

## 2021-09-17 ENCOUNTER — Encounter: Payer: Self-pay | Admitting: Gastroenterology

## 2021-09-17 VITALS — BP 120/84 | HR 70 | Ht 63.0 in | Wt 158.0 lb

## 2021-09-17 DIAGNOSIS — K2100 Gastro-esophageal reflux disease with esophagitis, without bleeding: Secondary | ICD-10-CM

## 2021-09-17 NOTE — Patient Instructions (Addendum)
If you are age 74 or older, your body mass index should be between 23-30. Your Body mass index is 27.99 kg/m. If this is out of the aforementioned range listed, please consider follow up with your Primary Care Provider.  If you are age 32 or younger, your body mass index should be between 19-25. Your Body mass index is 27.99 kg/m. If this is out of the aformentioned range listed, please consider follow up with your Primary Care Provider.   __________________________________________________________  The [place] [facility] providers would like to encourage you to use [facility] to communicate with providers for non-urgent requests or questions.  Due to long hold times on the telephone, sending your provider a message by [facility] may be a faster and more efficient way to get a response.  Please allow 48 business hours for a response.  Please remember that this is for non-urgent requests.   You have been scheduled for an endoscopy. Please follow written instructions given to you at your visit today. If you use inhalers (even only as needed), please bring them with you on the day of your procedure.  You have been scheduled for a 24 hour Bellamy Probe test at [facility] on               at            .Please arrive 30 minutes prior to your procedure for registration. You will need to go to outpatient registration (1st floor of the hospital) first. Make certain to bring your insurance cards as well as a complete list of medications.  Please remember the following:  1) Do not take any muscle relaxants, xanax (alprazolam) or ativan for 1 day prior to your test as well as the day of the test.  2) Nothing to eat or drink after 12:00 midnight on the night before your test.  3) Hold all diabetic medications/insulin the morning of the test. You may eat and take your medications after the test.  4) For 7 days prior to your test do not take: Dexilant, Prevacid, Nexium, Protonix, Aciphex, Zegerid, Pantoprazole,  Prilosec or omeprazole.  5) For 7 days prior to your test, do not take: Reglan, Tagamet, Zantac, Axid or Pepcid.  6) You MAY use an antacid such as Rolaids or Tums up to 12 hours prior to your test.  It will take at least 2 weeks to receive the results of this test from your physician.  ABOUT 24 HOUR PH PROBE An esophageal pH test measures and records the pH in your esophagus to determine if you have gastroesophageal reflux disease (GERD). The test can also be done to determine the effectiveness of medications or surgical treatment for GERD. What is esophageal reflux? Esophageal reflux is a condition in which stomach acid refluxes or moves back into the esophagus (the "food pipe" leading from the mouth to the stomach). How does the esophageal pH test work? A thin, small tube with an acid sensing device on the tip is gently passed through your nose, down the esophagus ("food tube"), and positioned about 2 inches above the lower esophageal sphincter. The tube is secured to the side of your face with clear tape. The end of the tube exiting from your nose is attached to a portable recorder that is worn on your belt or over your shoulder. The recorder has several buttons on it that you will press to mark certain events. A nurse will review the monitoring instructions with you. Once the test  has begun, what do I need to know and do? Activity: Follow your usual daily routine. Do not reduce or change your activities during the monitoring period. Doing so can make the monitoring results less useful.  Note: do not take a tub bath or shower; the equipment can't get wet.  Eating: Eat your regular meals at the usual times. If you do not eat during the monitoring period, your stomach will not produce acid as usual, and the test results will not be accurate. Eat at least 2 meals a day. Eat foods that tend to increase your symptoms (without making yourself miserable). Avoid snacking. Do not suck on hard candy or  lozenges and do not chew gum during the monitoring period.  Lying down: Remain upright throughout the day. Do not lie down until you go to bed (unless napping or lying down during the day is part of your daily routine).  Medications: Continue to follow your doctor's advice regarding medications to avoid during the monitoring period.  Recording symptoms: Press the appropriate button on your recorder when symptoms occur (as discussed with the nurse).  Recording events: Record the time you start and stop eating and drinking (anything other than plain water). Record the time you lie down (even if just resting) and when you get back up. The nurse will explain this.  Unusual symptoms or side effects. If you think you may be experiencing any unusual symptoms or side effects, call your doctor.  You will return the next day to have the tube removed. The information on the recorder will be downloaded to a computer and the results will be analyzed.  After completion of the study Resume your normal diet and medications. Lozenges or hard candy may help ease any sore throat caused by the tube.    Due to recent changes in healthcare laws, you may see the results of your imaging and laboratory studies on MyChart before your provider has had a chance to review them.  We understand that in some cases there may be results that are confusing or concerning to you. Not all laboratory results come back in the same time frame and the provider may be waiting for multiple results in order to interpret others.  Please give [place] 48 hours in order for your provider to thoroughly review all the results before contacting the office for clarification of your results.

## 2021-09-17 NOTE — Telephone Encounter (Signed)
Patient advised Normal GFR for age should be more than 59.  Her GFR is borderline low.  The only medication which will affect her kidney filtration rate is Celebrex.  The recommendations will be to stop Celebrex.  Morphea will not cause low GFR.  Nephrology referral is not necessary at this time.  She may discuss low GFR with her PCP. Patient expressed understanding.

## 2021-09-17 NOTE — Progress Notes (Signed)
[facility] Progress Note  Chief Complaint: GERD  Subjective  History: From my April 2022 office note: "On last endoscopy 4 years ago she had a somewhat patulous LES and mild reflux esophagitis.  We discussed GERD, limitation of medications, possibility of anatomic considerations, lack of Barrett's esophagus for her (she was concerned about risk of esophageal cancer), and how sometimes degree of symptoms do not always correlate with amount of reflux.  I still think she would benefit from pH and impedance testing while on acid suppression medicine.  If that were done and showed little reflux but with persistent symptoms, she might be better with low-dose amitriptyline for functional component rather than endoscopic or surgical therapy.   I offered her a least a temporary increase of the pantoprazole to 40 mg twice daily.  Several weeks to that to be enough to see if it will improve symptoms.  She wanted to go that route, so new prescription was sent.  If that does not help, then she believes she would likely pursue the pH and impedance testing. (She agreed to call or send a portal message in several weeks with an update)" __________________ Last EGD 04/2017 with me  Nimra is still troubled by frequent pyrosis occurring at least daily, and sometimes just when drinking water.  Increasing pantoprazole to twice daily has not helped much if at all.  Denies dysphagia or odynophagia.  Denies nausea vomiting or abdominal pain. She has become increasingly concerned that she may have a harmful condition causing this, notes that 1 or 2 acquaintances were diagnosed with esophageal cancer and this worries her.  Eats a healthy diet, non-smoker.  Her IBS causes intermittent lower abdominal cramps that are relieved with hyoscyamine.  ROS: Cardiovascular:  no chest pain Respiratory: no dyspnea Remainder of systems negative except as above The patient's Past Medical, Family and Social History were  reviewed and are on file in the EMR.  Objective:  Med list reviewed  Current Outpatient Medications:    acetaminophen (TYLENOL) 500 MG tablet, Take 1,000 mg by mouth every morning. Patient takes as needed, Disp: , Rfl:    ALPRAZolam (XANAX) 0.5 MG tablet, Take 0.25 mg by mouth daily as needed for anxiety or sleep. , Disp: , Rfl:    amLODipine (NORVASC) 5 MG tablet, Once a day po, watch for puffy feet !, Disp: 90 tablet, Rfl: 3   baclofen (LIORESAL) 10 MG tablet, TAKE 1 TABLET BY MOUTH AT 8 AM AND 2 PM AS NEEDED, Disp: 60 tablet, Rfl: 0   calcium carbonate (TUMS - DOSED IN MG ELEMENTAL CALCIUM) 500 MG chewable tablet, Chew 1 tablet by mouth as needed for indigestion or heartburn., Disp: , Rfl:    denosumab (PROLIA) 60 MG/ML SOSY injection, Inject 60 mg into the skin every 6 (six) months., Disp: , Rfl:    fluticasone (FLONASE) 50 MCG/ACT nasal spray, Place 1 spray into both nostrils daily., Disp: , Rfl:    hyoscyamine (LEVSIN) 0.125 MG tablet, Take 0.125 mg by mouth every 4 (four) hours as needed for bladder spasms or cramping. , Disp: , Rfl:    loratadine (CLARITIN) 10 MG tablet, Take 10 mg by mouth daily., Disp: , Rfl:    Multiple Vitamins-Minerals (CENTRUM PO), Take 1 capsule by mouth daily., Disp: , Rfl:    Olopatadine HCl (PATADAY OP), Apply to eye daily., Disp: , Rfl:    pantoprazole (PROTONIX) 40 MG tablet, TAKE 1 TABLET BY MOUTH  TWICE DAILY BEFORE A MEAL, Disp:  180 tablet, Rfl: 3   venlafaxine (EFFEXOR) 75 MG tablet, Take 75 mg by mouth daily after breakfast. , Disp: , Rfl:    Vital signs in last 24 hrs: Vitals:   09/17/21 0819  BP: 120/84  Pulse: 70  SpO2: 99%   Wt Readings from Last 3 Encounters:  09/17/21 158 lb (71.7 kg)  09/14/21 161 lb 6.4 oz (73.2 kg)  04/05/21 153 lb 12.8 oz (69.8 kg)    Physical Exam  Well-appearing, normal vocal quality HEENT: sclera anicteric, oral mucosa moist without lesions Neck: supple, no thyromegaly, JVD or lymphadenopathy Cardiac: RRR  without murmurs, S1S2 heard, no peripheral edema Pulm: clear to auscultation bilaterally, normal RR and effort noted Abdomen: soft, no tenderness, with active bowel sounds. No guarding or palpable hepatosplenomegaly. Skin; warm and dry, no jaundice or rash  Labs:   ___________________________________________ Radiologic studies:   ____________________________________________ Other:   _____________________________________________ Assessment & Plan  Assessment: Encounter Diagnosis  Name Primary?   Gastroesophageal reflux disease with esophagitis without hemorrhage Yes   Reflux demonstrated by mild esophagitis while on acid suppression therapy on last endoscopy in 2018.  Refractory heartburn despite high-dose acid suppression.  Unclear if there is significant nonacid reflux and/or visceral hypersensitivity.  Plan: Upper endoscopy.  This will give mucosal visualization and see if she has developed hiatal hernia, worsened esophagitis or any gastritis since the last exam She was agreeable after discussion of procedure and risks.  The benefits and risks of the planned procedure were described in detail with the patient or (when appropriate) their health care proxy.  Risks were outlined as including, but not limited to, bleeding, infection, perforation, adverse medication reaction leading to cardiac or pulmonary decompensation, pancreatitis (if ERCP).  The limitation of incomplete mucosal visualization was also discussed.  No guarantees or warranties were given.  24-hour pH and impedance test while on high-dose PPI to see if she has significant nonacid reflux.  I explained the rationale for these tests to quantify her reflux and rule out any harmful effects such as Barrett's esophagus or severe esophagitis.  Continue to engage in antireflux diet and lifestyle measures.  31 minutes were spent on this encounter (including chart review, history/exam, counseling/coordination of care, and  documentation) > 50% of that time was spent on counseling and coordination of care.   Heather Lindsey

## 2021-09-22 ENCOUNTER — Other Ambulatory Visit: Payer: Self-pay

## 2021-09-22 DIAGNOSIS — K2100 Gastro-esophageal reflux disease with esophagitis, without bleeding: Secondary | ICD-10-CM

## 2021-09-22 DIAGNOSIS — R12 Heartburn: Secondary | ICD-10-CM

## 2021-09-29 ENCOUNTER — Other Ambulatory Visit: Payer: Self-pay | Admitting: Physician Assistant

## 2021-09-29 NOTE — Telephone Encounter (Signed)
Dr. Estanislado Pandy recommended discontinuing celebrex due to low GFR on 09/16/21.

## 2021-09-29 NOTE — Telephone Encounter (Signed)
Next Visit: 03/16/2022  Last Visit: 09/14/2021  Last Fill: 07/19/2021  DX: Primary osteoarthritis of both hands   Current Dose per office note 09/14/2021: Celebrex 200 mg 1 capsule by mouth daily as needed  Labs: 09/14/2021 CBC is normal, renal function has decreased.   Okay to refill Celebrex?

## 2021-10-13 ENCOUNTER — Encounter: Payer: Self-pay | Admitting: Gastroenterology

## 2021-10-13 ENCOUNTER — Other Ambulatory Visit: Payer: Self-pay

## 2021-10-13 ENCOUNTER — Ambulatory Visit (AMBULATORY_SURGERY_CENTER): Payer: Medicare Other | Admitting: Gastroenterology

## 2021-10-13 ENCOUNTER — Telehealth: Payer: Self-pay

## 2021-10-13 VITALS — BP 116/61 | HR 63 | Temp 97.5°F | Resp 14 | Ht 63.0 in | Wt 158.0 lb

## 2021-10-13 DIAGNOSIS — K449 Diaphragmatic hernia without obstruction or gangrene: Secondary | ICD-10-CM

## 2021-10-13 DIAGNOSIS — K317 Polyp of stomach and duodenum: Secondary | ICD-10-CM

## 2021-10-13 DIAGNOSIS — K2100 Gastro-esophageal reflux disease with esophagitis, without bleeding: Secondary | ICD-10-CM | POA: Diagnosis not present

## 2021-10-13 MED ORDER — SODIUM CHLORIDE 0.9 % IV SOLN: 500.0000 mL | Freq: Once | INTRAVENOUS | Status: DC

## 2021-10-13 NOTE — Telephone Encounter (Signed)
Appt for 24 hour pH and impedance study has been changed to esophageal manometry only at [facility] on 12/29/21. Updated instructions mailed to patient.

## 2021-10-13 NOTE — Progress Notes (Signed)
A and O x3. Report to RN. Tolerated MAC anesthesia well.Teeth unchanged after procedure. 

## 2021-10-13 NOTE — Op Note (Signed)
[facility] Patient Name: Heather Lindsey Procedure Date: 10/13/2021 10:30 AM MRN: [id number] Endoscopist: Mallie Mussel L. Loletha Carrow , MD Age: 75 Referring MD:  Date of Birth: [birth year] Gender: Female Account #: [id number] Procedure:                Upper GI endoscopy Indications:              Esophageal reflux symptoms that persist despite                            appropriate theapy.                           see recent office note for details. frequent                            heartburn despite PPI - not improved on increase to                            twice daily dosing.                           EGD 04/2017 with Grade A esophagitis and patulous EGJ Medicines:                Monitored Anesthesia Care Procedure:                Pre-Anesthesia Assessment:                           - Prior to the procedure, a History and Physical                            was performed, and patient medications and                            allergies were reviewed. The patient's tolerance of                            previous anesthesia was also reviewed. The risks                            and benefits of the procedure and the sedation                            options and risks were discussed with the patient.                            All questions were answered, and informed consent                            was obtained. Prior Anticoagulants: The patient has                            taken no previous anticoagulant or antiplatelet  agents. ASA Grade Assessment: II - A patient with                            mild systemic disease. After reviewing the risks                            and benefits, the patient was deemed in                            satisfactory condition to undergo the procedure.                           After obtaining informed consent, the endoscope was                            passed under direct vision. Throughout the                             procedure, the patient's blood pressure, pulse, and                            oxygen saturations were monitored continuously. The                            GIF [id number] #[id number] was introduced through the                            mouth, and advanced to the second part of duodenum.                            The upper GI endoscopy was accomplished without                            difficulty. The patient tolerated the procedure                            well. Scope In: Scope Out: Findings:                 LA Grade C (one or more mucosal breaks continuous                            between tops of 2 or more mucosal folds, less than                            75% circumference) esophagitis with no bleeding was                            found in the lower third of the esophagus. No                            Barrett's seen.  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                           The cardia and gastric fundus were normal on                            retroflexion. (insufflation limited by [facility])                           The examined duodenum was normal. Complications:            No immediate complications. Estimated Blood Loss:     Estimated blood loss: none. Impression:               - LA Grade C reflux esophagitis with no bleeding.                           - 3 cm hiatal hernia.                           - Multiple fundic gland polyps.                           - Normal examined duodenum.                           - No specimens collected.                           GERD has clearly worsened since last evaluation. Recommendation:           - Patient has a contact number available for                            emergencies. The signs and symptoms of potential                            delayed  complications were discussed with the                            patient. Return to normal activities tomorrow.                            Written discharge instructions were provided to the                            patient.                           -  Resume previous diet.                           - Continue present medications.                           - Cancel 24hr pH and impedance testing and schedule                            esophageal manometry instead along with surgical                            consultation for consideration of anti-reflux                            surgery. Oleda Borski L. Loletha Carrow, MD 10/13/2021 10:58:14 AM This report has been signed electronically.

## 2021-10-13 NOTE — Progress Notes (Signed)
No changes to clinical history since GI office visit on 09/17/21.  The patient is appropriate for an endoscopic procedure in the ambulatory setting.

## 2021-10-13 NOTE — Telephone Encounter (Signed)
-----   Message from Doran Stabler, MD sent at 10/13/2021 12:51 PM EDT ----- Please see EGD report from today.  Patient currently scheduled for pH and impedance testing at [facility] on 12/29/21 Please cancel that study but order an esophageal manometry instead on that same day (and communicate with [facility] endo to avoid confusion)  Thank you  - HD

## 2021-10-13 NOTE — Patient Instructions (Signed)
Please read handouts provided. Continue present medications.      YOU HAD AN ENDOSCOPIC PROCEDURE TODAY AT THE [facility]:   Refer to the procedure report that was given to you for any specific questions about what was found during the examination.  If the procedure report does not answer your questions, please call your gastroenterologist to clarify.  If you requested that your care partner not be given the details of your procedure findings, then the procedure report has been included in a sealed envelope for you to review at your convenience later.  YOU SHOULD EXPECT: Some feelings of bloating in the abdomen. Passage of more gas than usual.  Walking can help get rid of the air that was put into your GI tract during the procedure and reduce the bloating. If you had a lower endoscopy (such as a colonoscopy or flexible sigmoidoscopy) you may notice spotting of blood in your stool or on the toilet paper. If you underwent a bowel prep for your procedure, you may not have a normal bowel movement for a few days.  Please Note:  You might notice some irritation and congestion in your nose or some drainage.  This is from the oxygen used during your procedure.  There is no need for concern and it should clear up in a day or so.  SYMPTOMS TO REPORT IMMEDIATELY:    Following upper endoscopy (EGD)  Vomiting of blood or coffee ground material  New chest pain or pain under the shoulder blades  Painful or persistently difficult swallowing  New shortness of breath  Fever of 100F or higher  Black, tarry-looking stools  For urgent or emergent issues, a gastroenterologist can be reached at any hour by calling [phone]. Do not use MyChart messaging for urgent concerns.    DIET:  We do recommend a small meal at first, but then you may proceed to your regular diet.  Drink plenty of fluids but you should avoid alcoholic beverages for 24 hours.  ACTIVITY:  You should plan to take it easy  for the rest of today and you should NOT DRIVE or use heavy machinery until tomorrow (because of the sedation medicines used during the test).    FOLLOW UP: Our staff will call the number listed on your records 48-72 hours following your procedure to check on you and address any questions or concerns that you may have regarding the information given to you following your procedure. If we do not reach you, we will leave a message.  We will attempt to reach you two times.  During this call, we will ask if you have developed any symptoms of COVID 19. If you develop any symptoms (ie: fever, flu-like symptoms, shortness of breath, cough etc.) before then, please call [phone].  If you test positive for Covid 19 in the 2 weeks post procedure, please call and report this information to us.    If any biopsies were taken you will be contacted by phone or by letter within the next 1-3 weeks.  Please call us at [phone] if you have not heard about the biopsies in 3 weeks.    SIGNATURES/CONFIDENTIALITY: You and/or your care partner have signed paperwork which will be entered into your electronic medical record.  These signatures attest to the fact that that the information above on your After Visit Summary has been reviewed and is understood.  Full responsibility of the confidentiality of this discharge information lies with you and/or your care-partner. 

## 2021-10-15 ENCOUNTER — Telehealth: Payer: Self-pay

## 2021-10-15 NOTE — Telephone Encounter (Signed)
  Follow up Call-  Call back number 10/13/2021  Post procedure Call Back phone  # [phone]  Permission to leave phone message Yes  Some recent data might be hidden     Left message

## 2021-10-15 NOTE — Telephone Encounter (Signed)
  Follow up Call-  Call back number 10/13/2021  Post procedure Call Back phone  # [phone]  Permission to leave phone message Yes  Some recent data might be hidden     Patient questions:  Do you have a fever, pain , or abdominal swelling? No. Pain Score  0 *  Have you tolerated food without any problems? Yes.    Have you been able to return to your normal activities? Yes.    Do you have any questions about your discharge instructions: Diet   No. Medications  No. Follow up visit  No.  Do you have questions or concerns about your Care? No.  Actions: * If pain score is 4 or above: No action needed, pain <4.

## 2021-10-18 NOTE — Telephone Encounter (Signed)
Doran Stabler, MD  Yevette Edwards, RN I am holding of on the surgical referral until after the manometry and further consideration of who I may want her to see at [place] or tertiary referral center.

## 2021-11-08 ENCOUNTER — Other Ambulatory Visit (HOSPITAL_COMMUNITY): Payer: Self-pay | Admitting: *Deleted

## 2021-11-09 ENCOUNTER — Ambulatory Visit (HOSPITAL_COMMUNITY)
Admission: RE | Admit: 2021-11-09 | Discharge: 2021-11-09 | Disposition: A | Discharge status: Home / Self Care | Payer: Medicare Other | Source: Ambulatory Visit | Attending: Internal Medicine | Admitting: Internal Medicine

## 2021-11-09 DIAGNOSIS — M810 Age-related osteoporosis without current pathological fracture: Secondary | ICD-10-CM | POA: Diagnosis present

## 2021-11-09 MED ORDER — DENOSUMAB 60 MG/ML ~~LOC~~ SOSY
60.0000 mg | PREFILLED_SYRINGE | Freq: Once | SUBCUTANEOUS | Status: AC
  Administered 2021-11-09: 60 mg via SUBCUTANEOUS

## 2021-12-06 ENCOUNTER — Other Ambulatory Visit: Payer: Self-pay | Admitting: Physician Assistant

## 2021-12-06 ENCOUNTER — Other Ambulatory Visit: Payer: Self-pay | Admitting: Rheumatology

## 2021-12-07 NOTE — Telephone Encounter (Signed)
Next Visit: 03/16/2022   Last Visit: 09/14/2021   Last Fill: 05/27/2021  Okay to refill Baclofen?

## 2021-12-20 ENCOUNTER — Encounter (HOSPITAL_COMMUNITY): Payer: Self-pay | Admitting: Gastroenterology

## 2021-12-20 NOTE — Progress Notes (Signed)
Attempted to obtain medical history via telephone, unable to reach at this time. I left a voicemail to return pre surgical testing department's phone call.  

## 2021-12-29 ENCOUNTER — Encounter (HOSPITAL_COMMUNITY)
Admission: RE | Disposition: A | Discharge status: Home / Self Care | Payer: Self-pay | Source: Home / Self Care | Attending: Gastroenterology

## 2021-12-29 ENCOUNTER — Ambulatory Visit (HOSPITAL_COMMUNITY)
Admission: RE | Admit: 2021-12-29 | Discharge: 2021-12-29 | Disposition: A | Discharge status: Home / Self Care | Payer: Medicare Other | Attending: Gastroenterology | Admitting: Gastroenterology

## 2021-12-29 DIAGNOSIS — K449 Diaphragmatic hernia without obstruction or gangrene: Secondary | ICD-10-CM | POA: Insufficient documentation

## 2021-12-29 DIAGNOSIS — K219 Gastro-esophageal reflux disease without esophagitis: Secondary | ICD-10-CM | POA: Insufficient documentation

## 2021-12-29 DIAGNOSIS — R12 Heartburn: Secondary | ICD-10-CM

## 2021-12-29 HISTORY — PX: ESOPHAGEAL MANOMETRY: SHX5429

## 2021-12-29 SURGERY — MANOMETRY, ESOPHAGUS

## 2021-12-29 MED ORDER — LIDOCAINE VISCOUS HCL 2 % MT SOLN
OROMUCOSAL | Status: AC
  Filled 2021-12-29: qty 15

## 2021-12-29 SURGICAL SUPPLY — 2 items
FACESHIELD LNG OPTICON STERILE (SAFETY) IMPLANT
GLOVE BIO SURGEON STRL SZ8 (GLOVE) ×4 IMPLANT

## 2021-12-29 NOTE — Progress Notes (Signed)
Esophageal Manometry done per protocol. Patient tolerated well without distress or complication.  

## 2021-12-30 ENCOUNTER — Encounter (HOSPITAL_COMMUNITY): Payer: Self-pay | Admitting: Gastroenterology

## 2022-01-11 DIAGNOSIS — R12 Heartburn: Secondary | ICD-10-CM

## 2022-01-14 ENCOUNTER — Telehealth: Payer: Self-pay | Admitting: Gastroenterology

## 2022-01-14 DIAGNOSIS — K2100 Gastro-esophageal reflux disease with esophagitis, without bleeding: Secondary | ICD-10-CM

## 2022-01-14 DIAGNOSIS — K449 Diaphragmatic hernia without obstruction or gangrene: Secondary | ICD-10-CM

## 2022-01-14 NOTE — Telephone Encounter (Signed)
Returned call to patient. I told her that Dr. Loletha Carrow mentioned referral to discuss hernia repair and fundoplication. Pt states that she thinks she has seen Dr. Marcello Moores at [facility] before. I told her that she could call [facility] to see if they would let her schedule a f/u appt. I told her that if they let her schedule we will go ahead and fax the records, other wise we will have to send the referral for her. Pt will let us know what [facility] says. Pt had no other concerns at the end of the call.

## 2022-01-14 NOTE — Telephone Encounter (Signed)
Inbound call from patient states after procedure 1/18 she was told about a surgery that is recommended to her. She would like a call back to further discuss . Best contact number [phone]

## 2022-01-17 ENCOUNTER — Other Ambulatory Visit: Payer: Medicare Other

## 2022-01-17 DIAGNOSIS — K2100 Gastro-esophageal reflux disease with esophagitis, without bleeding: Secondary | ICD-10-CM

## 2022-01-17 DIAGNOSIS — K449 Diaphragmatic hernia without obstruction or gangrene: Secondary | ICD-10-CM

## 2022-01-17 LAB — BUN/CREATININE RATIO
BUN: 16 mg/dL (ref 7–25)
Creat: 0.87 mg/dL (ref 0.60–1.00)
eGFR: 70 mL/min/{1.73_m2} (ref >=60)

## 2022-01-17 NOTE — Telephone Encounter (Signed)
Returned call to patient. She is aware that Dr. Loletha Carrow would like to refer her to Dr. Kipp Brood with [facility]. Pt is aware that we will place the referral and Dr. [facility] office will call her to set up an appt. She knows that there office requires a CT of the chest prior to an appt. Pt would like to proceed. She knows that she will need to come by our office this week to check BUN/Creatinine since she will be getting IV contrast. Pt knows that no appt is necessary for labs and she can stop by at her convenience. Patient verbalized understanding of all information and had no concerns at the end of the call.   Lab orders in epic.   CT order in epic. Secure staff message sent to radiology scheduling to contact pt to set up appt.   Ambulatory referral to surgery in epic - requesting Dr. Kipp Brood

## 2022-01-17 NOTE — Telephone Encounter (Signed)
"  Thank you for the update.  I have lately been sending hiatal hernia patients to Dr. Melodie Bouillon of thoracic surgery at [facility].   Please send a referral to him, and also arrange a CT scan of the chest with IV contrast (since that office wants a scan on all patients being referred for consideration of hiatal hernia repair).   Darlys has a normal creatinine on last check several months ago, but she will need an updated BUN and creatinine for the IV contrast.   - HD "

## 2022-01-17 NOTE — Telephone Encounter (Signed)
Inbound call from patient states she spoke with [facility] and was told she would need a referral since they merged with [facility] and no longer have her in the system

## 2022-01-21 ENCOUNTER — Telehealth: Payer: Self-pay | Admitting: Gastroenterology

## 2022-01-21 ENCOUNTER — Encounter: Payer: Self-pay | Admitting: Gastroenterology

## 2022-01-21 NOTE — Telephone Encounter (Signed)
Per patient's DPR - "Okay to leave detailed message on home answering machine and cell phone voice mail."  Left pt a detailed message letting her know that radiology scheduling is supposed to contact her directly. I told her that since she has not heard from them by now she can call radiology scheduling at [phone] and they can get her scheduled. I told the pt to call me back if she had any questions or concerns.

## 2022-01-21 NOTE — Telephone Encounter (Signed)
Patient called this morning stating she still hasn't heard anything from [facility] about scheduling her CT scan and wanted to make sure she understood correctly that they will contact her for the appointment and it wasn't something she should contact them about.  Please call patient and advise.

## 2022-01-27 ENCOUNTER — Other Ambulatory Visit: Payer: Self-pay

## 2022-01-27 ENCOUNTER — Ambulatory Visit (HOSPITAL_BASED_OUTPATIENT_CLINIC_OR_DEPARTMENT_OTHER)
Admission: RE | Admit: 2022-01-27 | Discharge: 2022-01-27 | Disposition: A | Discharge status: Home / Self Care | Payer: Medicare Other | Source: Ambulatory Visit | Attending: Gastroenterology | Admitting: Gastroenterology

## 2022-01-27 ENCOUNTER — Encounter (HOSPITAL_BASED_OUTPATIENT_CLINIC_OR_DEPARTMENT_OTHER): Payer: Self-pay

## 2022-01-27 DIAGNOSIS — K449 Diaphragmatic hernia without obstruction or gangrene: Secondary | ICD-10-CM | POA: Diagnosis not present

## 2022-01-27 DIAGNOSIS — K2100 Gastro-esophageal reflux disease with esophagitis, without bleeding: Secondary | ICD-10-CM | POA: Diagnosis present

## 2022-01-27 MED ORDER — IOHEXOL 300 MG/ML  SOLN
75.0000 mL | Freq: Once | INTRAMUSCULAR | Status: AC | PRN
  Administered 2022-01-27: 75 mL via INTRAVENOUS

## 2022-01-31 ENCOUNTER — Other Ambulatory Visit: Payer: Self-pay

## 2022-01-31 ENCOUNTER — Telehealth: Payer: Self-pay | Admitting: Gastroenterology

## 2022-01-31 DIAGNOSIS — R9389 Abnormal findings on diagnostic imaging of other specified body structures: Secondary | ICD-10-CM

## 2022-01-31 NOTE — Telephone Encounter (Signed)
Inbound call from patient returning nurse's call.

## 2022-02-01 NOTE — Telephone Encounter (Signed)
Returned call to patient. See 01/27/22 CT report for details.

## 2022-02-04 ENCOUNTER — Other Ambulatory Visit: Payer: Self-pay | Admitting: *Deleted

## 2022-02-04 ENCOUNTER — Encounter: Payer: Self-pay | Admitting: *Deleted

## 2022-02-04 ENCOUNTER — Other Ambulatory Visit: Payer: Self-pay

## 2022-02-04 ENCOUNTER — Institutional Professional Consult (permissible substitution): Payer: Medicare Other | Admitting: Thoracic Surgery (Cardiothoracic Vascular Surgery)

## 2022-02-04 VITALS — BP 134/80 | HR 85 | Resp 20 | Ht 63.0 in | Wt 156.0 lb

## 2022-02-04 DIAGNOSIS — K449 Diaphragmatic hernia without obstruction or gangrene: Secondary | ICD-10-CM

## 2022-02-04 NOTE — Progress Notes (Signed)
[address]       Heather Lindsey,Heather Lindsey [id number]             [phone]                    Heather Lindsey [place] Medical Record #[id number] Date of Birth: 06-24-1946  Referring: Heather Stabler, MD Primary Care: Heather Solian, MD Primary Cardiologist: None  Chief Complaint:    Chief Complaint  Patient presents with   Hiatal Hernia    Surgical consult, Chest CT 01/27/22, Esophageal Manometry 01/04/22,upper ENDO 10/13/21,     History of Present Illness:    Heather Lindsey 76 y.o. female presents for surgical evaluation of gastroesophageal reflux.  She endorses a long history of reflux which has worsened over the last several years.  She has been placed on Protonix twice daily but continue use to have midepigastric burning sensation.  She also admits to coughing spells most mornings due to excess secretions.  She denies any dysphagia or odynophagia.  She has been having some mild shortness of breath but thinks that this is related mostly to age.         Zubrod Score: At the time of surgery this patients most appropriate activity status/level should be described as: [x]     0    Normal activity, no symptoms []     1    Restricted in physical strenuous activity but ambulatory, able to do out light work []     2    Ambulatory and capable of self care, unable to do work activities, up and about               >50 % of waking hours                              []     3    Only limited self care, in bed greater than 50% of waking hours []     4    Completely disabled, no self care, confined to bed or chair []     5    Moribund   Past Medical History:  Diagnosis Date   Anxiety    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   Brain bleed ([facility])    Circumscribed scleroderma    Depression    Diverticulosis    GERD (gastroesophageal reflux disease)    Hyperlipidemia    IBS (irritable bowel syndrome)    Memory loss    Migraines    Osteoarthritis     Osteoporosis    Stroke ([place]) 2015    Past Surgical History:  Procedure Laterality Date   ANKLE FRACTURE SURGERY Left    ESOPHAGEAL MANOMETRY N/A 12/29/2021   Procedure: ESOPHAGEAL MANOMETRY (EM);  Surgeon: Heather Stabler, MD;  Location: [facility];  Service: Gastroenterology;  Laterality: N/A;  pt will stay on her PPI   TOTAL HIP ARTHROPLASTY Left 01/06/2019   Procedure: TOTAL HIP ARTHROPLASTY ANTERIOR APPROACH;  Surgeon: Heather Leitz, MD;  Location: [facility];  Service: Orthopedics;  Laterality: Left;   TUBAL LIGATION      Family History  Problem Relation Age of Onset   Heart disease Father    Liver cancer Paternal Aunt    Prostate cancer Paternal Grandfather    Colon cancer Neg Hx    Stomach cancer Neg Hx    Pancreatic cancer Neg Hx  Esophageal cancer Neg Hx    Rectal cancer Neg Hx      Social History   Tobacco Use  Smoking Status Former  Smokeless Tobacco Never  Tobacco Comments   Quit now 40 years     Social History   Substance and Sexual Activity  Alcohol Use Not Currently     Allergies  Allergen Reactions   Actonel [Risedronate]     Severe chest pain   Alendronate Other (See Comments)    Severe chest pain   Boniva [Ibandronic Acid]     Chest pain   Lisinopril     vertigo    Current Outpatient Medications  Medication Sig Dispense Refill   acetaminophen (TYLENOL) 500 MG tablet Take 1,000 mg by mouth every morning. Patient takes as needed     ALPRAZolam (XANAX) 0.5 MG tablet Take 0.25 mg by mouth daily as needed for anxiety or sleep.      baclofen (LIORESAL) 10 MG tablet TAKE 1 TABLET BY MOUTH AT 8 AM AND 2 PM AS NEEDED 60 tablet 0   calcium carbonate (TUMS - DOSED IN MG ELEMENTAL CALCIUM) 500 MG chewable tablet Chew 1 tablet by mouth as needed for indigestion or heartburn.     Cholecalciferol 50 MCG (2000 UT) CAPS 1 po qod     denosumab (PROLIA) 60 MG/ML SOSY injection Inject 60 mg into the skin every 6 (six) months.     fluticasone (FLONASE) 50  MCG/ACT nasal spray Place 1 spray into both nostrils daily.     hyoscyamine (LEVSIN) 0.125 MG tablet Take 0.125 mg by mouth every 4 (four) hours as needed for bladder spasms or cramping.     loratadine (CLARITIN) 10 MG tablet Take 10 mg by mouth daily.     Multiple Vitamin (MULTI-VITAMIN DAILY PO) Take 1 tablet by mouth daily.     Olopatadine HCl (PATADAY OP) Apply to eye daily.     pantoprazole (PROTONIX) 40 MG tablet TAKE 1 TABLET BY MOUTH  TWICE DAILY BEFORE A MEAL 180 tablet 3   amLODipine (NORVASC) 5 MG tablet Once a day po, watch for puffy feet ! 90 tablet 3   venlafaxine (EFFEXOR) 75 MG tablet Take 75 mg by mouth daily after breakfast.      No current facility-administered medications for this visit.    Review of Systems  Constitutional:  Negative for fever, malaise/fatigue and weight loss.  HENT:  Positive for sore throat.   Respiratory:  Positive for cough and shortness of breath.   Cardiovascular:  Negative for chest pain and leg swelling.  Gastrointestinal:  Positive for abdominal pain and heartburn. Negative for nausea and vomiting.    PHYSICAL EXAMINATION: BP 134/80    Pulse 85    Resp 20    Ht 5\' 3"  (1.6 m)    Wt 156 lb (70.8 kg)    SpO2 95% Comment: RA   BMI 27.63 kg/m  Physical Exam Constitutional:      General: She is not in acute distress.    Appearance: Normal appearance. She is normal weight. She is not ill-appearing.  HENT:     Head: Normocephalic and atraumatic.  Cardiovascular:     Rate and Rhythm: Normal rate.  Pulmonary:     Effort: Pulmonary effort is normal. No respiratory distress.  Abdominal:     General: Abdomen is flat. There is no distension.  Musculoskeletal:        General: Normal range of motion.     Cervical back: Normal range  of motion.  Skin:    General: Skin is warm and dry.  Neurological:     General: No focal deficit present.     Mental Status: She is alert and oriented to person, place, and time.    Diagnostic Studies & Laboratory  data:    CT Scan:   01/27/22 1. Small hiatal hernia. Prominent but nonenlarged paraesophageal lymph nodes are noted, which are nonspecific, but likely reactive given the patient's history of esophagitis. 2. Findings in the lungs are concerning for potential interstitial lung disease. Outpatient referral to Pulmonology for further clinical evaluation is recommended. Follow-up high-resolution chest CT is suggested in 6-12 months to assess for temporal changes in the appearance of the lung parenchyma. 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 3. Aortic atherosclerosis.    EGD/EUS: 11/02/21 - LA Grade C reflux esophagitis with no bleeding. - 3 cm hiatal hernia. - Multiple fundic gland polyps. - Normal examined duodenum. - No specimens collected.   Manometry: hiatal hernia.  Normal LES, and peristalsis      I have independently reviewed the above radiology studies  and reviewed the findings with the patient.   Recent Lab Findings: Lab Results  Component Value Date   WBC 6.3 09/14/2021   HGB 12.8 09/14/2021   HCT 38.2 09/14/2021   PLT 264 09/14/2021   GLUCOSE 87 09/14/2021   ALT 13 09/14/2021   AST 18 09/14/2021   NA 138 09/14/2021   K 4.9 09/14/2021   CL 103 09/14/2021   CREATININE 0.87 01/17/2022   BUN 16 01/17/2022   CO2 26 09/14/2021   TSH 1.434 01/06/2019   INR 0.95 01/06/2019      Assessment / Plan:   76 year old female with history of gastroesophageal reflux, and grade C esophagitis on most recent endoscopy I personally reviewed her manometry as well as her cross-sectional imaging.  In regards to her manometry her GE junction shows normal pressures over there is evidence of a hiatal hernia.  She also has normal peristalsis of her esophagus.  Review of her CT scan she does have a small sliding hernia, and there is concern for an interstitial pattern in her lungs, which may be  related to chronic reflux and aspiration.  We discussed the risks and benefits for a robotic assisted hiatal hernia repair with Nissen fundoplication and she is agreeable to proceed.  She is tentatively scheduled for February 24, 2022.     I  spent 40 minutes with the patient face to face counseling and coordination of care.    Lajuana Matte 02/04/2022 2:35 PM

## 2022-02-14 ENCOUNTER — Encounter: Payer: Self-pay | Admitting: Rheumatology

## 2022-02-14 NOTE — Telephone Encounter (Signed)
Patient has a history of morphea.  Previously treated with methotrexate.   ?She was referred to pulmonology for further evaluation.   ?Would you like to schedule a sooner office visit with the patient once she has seen pulm?

## 2022-02-15 ENCOUNTER — Other Ambulatory Visit: Payer: Self-pay | Admitting: Rheumatology

## 2022-02-15 ENCOUNTER — Institutional Professional Consult (permissible substitution): Payer: Medicare Other | Admitting: Internal Medicine

## 2022-02-15 DIAGNOSIS — R0989 Other specified symptoms and signs involving the circulatory and respiratory systems: Secondary | ICD-10-CM

## 2022-02-15 DIAGNOSIS — Z79899 Other long term (current) drug therapy: Secondary | ICD-10-CM

## 2022-02-15 NOTE — Telephone Encounter (Signed)
LMAM to call back.

## 2022-02-15 NOTE — Telephone Encounter (Signed)
I returned patient's call.  I will order autoimmune labs.  Patient will come in the office for the lab work prior to her appointment.  I discussed that it is unusual to develop ILD with morphea.  She has an appointment scheduled with Dr. Chase Caller in April.

## 2022-02-21 NOTE — Pre-Procedure Instructions (Signed)
Surgical Instructions ? ? ? Your procedure is scheduled on Thursday, March 16. ? Report to [facility] Main Entrance "A" at 5:30 A.M., then check in with the Admitting office. ? Call this number if you have problems the morning of surgery: ? [phone] ? ? If you have any questions prior to your surgery date call [phone]: Open Monday-Friday 8am-4pm ? ? ? Remember: ? Do not eat after midnight the night before your surgery ? ? Take these medicines the morning of surgery with A SIP OF WATER:  ?pantoprazole (PROTONIX)  ?venlafaxine XR (EFFEXOR-XR)  ?fluticasone (FLONASE)  ? ?acetaminophen (TYLENOL) if needed ?ALPRAZolam Duanne Moron) if needed ?baclofen (LIORESAL) if needed ?hyoscyamine (LEVSIN) if needed ?loratadine (CLARITIN) if needed ? ?As of today, STOP taking any Aspirin (unless otherwise instructed by your surgeon), Diclofenac (Voltaren) gel, Aleve, Naproxen, Ibuprofen, Motrin, Advil, Goody's, BC's, all herbal medications, fish oil, and all vitamins. ? ?         ?Do not wear jewelry or makeup ?Do not wear lotions, powders, perfumes/colognes, or deodorant. ?Do not shave 48 hours prior to surgery.  Men may shave face and neck. ?Do not bring valuables to the hospital. ?Do not wear nail polish, gel polish, artificial nails, or any other type of covering on natural nails (fingers and toes) ?If you have artificial nails or gel coating that need to be removed by a nail salon, please have this removed prior to surgery. Artificial nails or gel coating may interfere with anesthesia's ability to adequately monitor your vital signs. ? ?[place] is not responsible for any belongings or valuables. .  ? ?Do NOT Smoke (Tobacco/Vaping)  24 hours prior to your procedure ? ?If you use a CPAP at night, you may bring your mask for your overnight stay. ?  ?Contacts, glasses, hearing aids, dentures or partials may not be worn into surgery, please bring cases for these belongings ?  ?For patients admitted to the hospital, discharge  time will be determined by your treatment team. ?  ?Patients discharged the day of surgery will not be allowed to drive home, and someone needs to stay with them for 24 hours. ? ?NO VISITORS WILL BE ALLOWED IN PRE-OP WHERE PATIENTS ARE PREPPED FOR SURGERY.  ONLY 1 SUPPORT PERSON MAY BE PRESENT IN THE WAITING ROOM WHILE YOU ARE IN SURGERY.  IF YOU ARE TO BE ADMITTED, ONCE YOU ARE IN YOUR ROOM YOU WILL BE ALLOWED TWO (2) VISITORS. 1 (ONE) VISITOR MAY STAY OVERNIGHT BUT MUST ARRIVE TO THE ROOM BY 8pm.  Minor children may have two parents present. Special consideration for safety and communication needs will be reviewed on a case by case basis. ? ?Special instructions:   ? ?Oral Hygiene is also important to reduce your risk of infection.  Remember - BRUSH YOUR TEETH THE MORNING OF SURGERY WITH YOUR REGULAR TOOTHPASTE ? ? ?[place]- Preparing For Surgery ? ?Before surgery, you can play an important role. Because skin is not sterile, your skin needs to be as free of germs as possible. You can reduce the number of germs on your skin by washing with CHG (chlorahexidine gluconate) Soap before surgery.  CHG is an antiseptic cleaner which kills germs and bonds with the skin to continue killing germs even after washing.   ? ? ?Please do not use if you have an allergy to CHG or antibacterial soaps. If your skin becomes reddened/irritated stop using the CHG.  ?Do not shave (including legs and underarms) for at least 48 hours  prior to first CHG shower. It is OK to shave your face. ? ?Please follow these instructions carefully. ?  ? ? Shower the NIGHT BEFORE SURGERY and the MORNING OF SURGERY with CHG Soap.  ? If you chose to wash your hair, wash your hair first as usual with your normal shampoo. After you shampoo, rinse your hair and body thoroughly to remove the shampoo.  Then [employer] and genitals (private parts) with your normal soap and rinse thoroughly to remove soap. ? ?After that Use CHG Soap as you would any other  liquid soap. You can apply CHG directly to the skin and wash gently with a scrungie or a clean washcloth.  ? ?Apply the CHG Soap to your body ONLY FROM THE NECK DOWN.  Do not use on open wounds or open sores. Avoid contact with your eyes, ears, mouth and genitals (private parts). Wash Face and genitals (private parts)  with your normal soap.  ? ?Wash thoroughly, paying special attention to the area where your surgery will be performed. ? ?Thoroughly rinse your body with warm water from the neck down. ? ?DO NOT shower/wash with your normal soap after using and rinsing off the CHG Soap. ? ?Pat yourself dry with a CLEAN TOWEL. ? ?Wear CLEAN PAJAMAS to bed the night before surgery ? ?Place CLEAN SHEETS on your bed the night before your surgery ? ?DO NOT SLEEP WITH PETS. ? ? ?Day of Surgery: ? ?Take a shower with CHG soap. ?Wear Clean/Comfortable clothing the morning of surgery ?Do not apply any deodorants/lotions.   ?Remember to brush your teeth WITH YOUR REGULAR TOOTHPASTE. ? ? ? ?COVID testing ? ?If you are going to stay overnight or be admitted after your procedure/surgery and require a pre-op COVID test, please follow these instructions after your COVID test  ? ?You are not required to quarantine however you are required to wear a well-fitting mask when you are out and around people not in your household.  If your mask becomes wet or soiled, replace with a new one. ? ?Wash your hands often with soap and water for 20 seconds or clean your hands with an alcohol-based hand sanitizer that contains at least 60% alcohol. ? ?Do not share personal items. ? ?Notify your provider: ?if you are in close contact with someone who has COVID  ?or if you develop a fever of 100.4 or greater, sneezing, cough, sore throat, shortness of breath or body aches. ? ?  ?Please read over the following fact sheets that you were given.  ? ?

## 2022-02-21 NOTE — Pre-Procedure Instructions (Signed)
Surgical Instructions ? ? ? Your procedure is scheduled on Thursday, March 16. ? Report to [facility] Main Entrance "A" at 5:30 A.M., then check in with the Admitting office. ? Call this number if you have problems the morning of surgery: ? [phone] ? ? If you have any questions prior to your surgery date call [phone]: Open Monday-Friday 8am-4pm ? ? ? Remember: ? Do not eat or drink after midnight the night before your surgery ? ? Take these medicines the morning of surgery with A SIP OF WATER:  ?pantoprazole (PROTONIX)  ?venlafaxine XR (EFFEXOR-XR)  ?fluticasone (FLONASE)  ? ?acetaminophen (TYLENOL) if needed ?ALPRAZolam Duanne Moron) if needed ?baclofen (LIORESAL) if needed ?hyoscyamine (LEVSIN) if needed ?loratadine (CLARITIN) if needed ?carboxymethylcellulose (REFRESH PLUS)- if needed ?Olopatadine HCl (PATADAY)- if needed ? ?As of today, STOP taking any Aspirin (unless otherwise instructed by your surgeon), Diclofenac (Voltaren) gel, Aleve, Naproxen, Ibuprofen, Motrin, Advil, Goody's, BC's, all herbal medications, fish oil, and all vitamins. ? ?         ?Do not wear jewelry or makeup ?Do not wear lotions, powders, perfumes/colognes, or deodorant. ?Do not shave 48 hours prior to surgery.  Men may shave face and neck. ?Do not bring valuables to the hospital. ?Do not wear nail polish, gel polish, artificial nails, or any other type of covering on natural nails (fingers and toes) ?If you have artificial nails or gel coating that need to be removed by a nail salon, please have this removed prior to surgery. Artificial nails or gel coating may interfere with anesthesia's ability to adequately monitor your vital signs. ? ?[place] is not responsible for any belongings or valuables. .  ? ?Do NOT Smoke (Tobacco/Vaping)  24 hours prior to your procedure ? ?If you use a CPAP at night, you may bring your mask for your overnight stay. ?  ?Contacts, glasses, hearing aids, dentures or partials may not be worn into  surgery, please bring cases for these belongings ?  ?For patients admitted to the hospital, discharge time will be determined by your treatment team. ?  ?Patients discharged the day of surgery will not be allowed to drive home, and someone needs to stay with them for 24 hours. ? ?NO VISITORS WILL BE ALLOWED IN PRE-OP WHERE PATIENTS ARE PREPPED FOR SURGERY.  ONLY 1 SUPPORT PERSON MAY BE PRESENT IN THE WAITING ROOM WHILE YOU ARE IN SURGERY.  IF YOU ARE TO BE ADMITTED, ONCE YOU ARE IN YOUR ROOM YOU WILL BE ALLOWED TWO (2) VISITORS. 1 (ONE) VISITOR MAY STAY OVERNIGHT BUT MUST ARRIVE TO THE ROOM BY 8pm.  Minor children may have two parents present. Special consideration for safety and communication needs will be reviewed on a case by case basis. ? ?Special instructions:   ? ?Oral Hygiene is also important to reduce your risk of infection.  Remember - BRUSH YOUR TEETH THE MORNING OF SURGERY WITH YOUR REGULAR TOOTHPASTE ? ? ?[place]- Preparing For Surgery ? ?Before surgery, you can play an important role. Because skin is not sterile, your skin needs to be as free of germs as possible. You can reduce the number of germs on your skin by washing with CHG (chlorahexidine gluconate) Soap before surgery.  CHG is an antiseptic cleaner which kills germs and bonds with the skin to continue killing germs even after washing.   ? ? ?Please do not use if you have an allergy to CHG or antibacterial soaps. If your skin becomes reddened/irritated stop using the CHG.  ?  Do not shave (including legs and underarms) for at least 48 hours prior to first CHG shower. It is OK to shave your face. ? ?Please follow these instructions carefully. ?  ? ? Shower the NIGHT BEFORE SURGERY and the MORNING OF SURGERY with CHG Soap.  ? If you chose to wash your hair, wash your hair first as usual with your normal shampoo. After you shampoo, rinse your hair and body thoroughly to remove the shampoo.  Then [employer] and genitals (private parts) with your  normal soap and rinse thoroughly to remove soap. ? ?After that Use CHG Soap as you would any other liquid soap. You can apply CHG directly to the skin and wash gently with a scrungie or a clean washcloth.  ? ?Apply the CHG Soap to your body ONLY FROM THE NECK DOWN.  Do not use on open wounds or open sores. Avoid contact with your eyes, ears, mouth and genitals (private parts). Wash Face and genitals (private parts)  with your normal soap.  ? ?Wash thoroughly, paying special attention to the area where your surgery will be performed. ? ?Thoroughly rinse your body with warm water from the neck down. ? ?DO NOT shower/wash with your normal soap after using and rinsing off the CHG Soap. ? ?Pat yourself dry with a CLEAN TOWEL. ? ?Wear CLEAN PAJAMAS to bed the night before surgery ? ?Place CLEAN SHEETS on your bed the night before your surgery ? ?DO NOT SLEEP WITH PETS. ? ? ?Day of Surgery: ? ?Take a shower with CHG soap. ?Wear Clean/Comfortable clothing the morning of surgery ?Do not apply any deodorants/lotions.   ?Remember to brush your teeth WITH YOUR REGULAR TOOTHPASTE. ? ? ? ?COVID testing ? ?If you are going to stay overnight or be admitted after your procedure/surgery and require a pre-op COVID test, please follow these instructions after your COVID test  ? ?You are not required to quarantine however you are required to wear a well-fitting mask when you are out and around people not in your household.  If your mask becomes wet or soiled, replace with a new one. ? ?Wash your hands often with soap and water for 20 seconds or clean your hands with an alcohol-based hand sanitizer that contains at least 60% alcohol. ? ?Do not share personal items. ? ?Notify your provider: ?if you are in close contact with someone who has COVID  ?or if you develop a fever of 100.4 or greater, sneezing, cough, sore throat, shortness of breath or body aches. ? ?  ?Please read over the following fact sheets that you were given.  ? ?

## 2022-02-22 ENCOUNTER — Encounter (HOSPITAL_COMMUNITY)
Admission: RE | Admit: 2022-02-22 | Discharge: 2022-02-22 | Disposition: A | Discharge status: Home / Self Care | Payer: Medicare Other | Source: Ambulatory Visit | Attending: Thoracic Surgery (Cardiothoracic Vascular Surgery) | Admitting: Thoracic Surgery (Cardiothoracic Vascular Surgery)

## 2022-02-22 ENCOUNTER — Encounter (HOSPITAL_COMMUNITY): Payer: Self-pay

## 2022-02-22 ENCOUNTER — Other Ambulatory Visit: Payer: Self-pay

## 2022-02-22 ENCOUNTER — Ambulatory Visit (HOSPITAL_COMMUNITY)
Admission: RE | Admit: 2022-02-22 | Discharge: 2022-02-22 | Disposition: A | Discharge status: Home / Self Care | Payer: Medicare Other | Source: Ambulatory Visit | Attending: Thoracic Surgery (Cardiothoracic Vascular Surgery) | Admitting: Thoracic Surgery (Cardiothoracic Vascular Surgery)

## 2022-02-22 VITALS — BP 128/80 | HR 92 | Temp 98.2°F | Resp 18 | Ht 64.0 in | Wt 160.3 lb

## 2022-02-22 DIAGNOSIS — Z01818 Encounter for other preprocedural examination: Secondary | ICD-10-CM

## 2022-02-22 DIAGNOSIS — K449 Diaphragmatic hernia without obstruction or gangrene: Secondary | ICD-10-CM

## 2022-02-22 DIAGNOSIS — Z20822 Contact with and (suspected) exposure to covid-19: Secondary | ICD-10-CM | POA: Insufficient documentation

## 2022-02-22 LAB — COMPREHENSIVE METABOLIC PANEL
ALT: 15 U/L (ref 0–44)
AST: 19 U/L (ref 15–41)
Albumin: 3.6 g/dL (ref 3.5–5.0)
Alkaline Phosphatase: 57 U/L (ref 38–126)
Anion gap: 10 (ref 5–15)
BUN: 10 mg/dL (ref 8–23)
CO2: 21 mmol/L — ABNORMAL LOW (ref 22–32)
Calcium: 9.4 mg/dL (ref 8.9–10.3)
Chloride: 104 mmol/L (ref 98–111)
Creatinine, Ser: 0.78 mg/dL (ref 0.44–1.00)
GFR, Estimated: >60 mL/min (ref >60)
Glucose, Bld: 105 mg/dL — ABNORMAL HIGH (ref 70–99)
Potassium: 4.2 mmol/L (ref 3.5–5.1)
Sodium: 135 mmol/L (ref 135–145)
Total Bilirubin: 0.4 mg/dL (ref 0.3–1.2)
Total Protein: 7.1 g/dL (ref 6.5–8.1)

## 2022-02-22 LAB — BLOOD GAS, ARTERIAL
Acid-Base Excess: 1.3 mmol/L (ref 0.0–2.0)
Bicarbonate: 25 mmol/L (ref 20.0–28.0)
Drawn by: 60286
O2 Saturation: 98.7 %
Patient temperature: 37
pCO2 arterial: 36 mmHg (ref 32–48)
pH, Arterial: 7.45 (ref 7.35–7.45)
pO2, Arterial: 98 mmHg (ref 83–108)

## 2022-02-22 LAB — URINALYSIS, ROUTINE W REFLEX MICROSCOPIC
Bilirubin Urine: NEGATIVE
Glucose, UA: NEGATIVE mg/dL
Hgb urine dipstick: NEGATIVE
Ketones, ur: NEGATIVE mg/dL
Leukocytes,Ua: NEGATIVE
Nitrite: NEGATIVE
Protein, ur: NEGATIVE mg/dL
Specific Gravity, Urine: 1.011 (ref 1.005–1.030)
pH: 7 (ref 5.0–8.0)

## 2022-02-22 LAB — SURGICAL PCR SCREEN
MRSA, PCR: NEGATIVE
Staphylococcus aureus: NEGATIVE

## 2022-02-22 LAB — CBC
HCT: 39.3 % (ref 36.0–46.0)
Hemoglobin: 12.8 g/dL (ref 12.0–15.0)
MCH: 29.3 pg (ref 26.0–34.0)
MCHC: 32.6 g/dL (ref 30.0–36.0)
MCV: 89.9 fL (ref 80.0–100.0)
Platelets: 264 10*3/uL (ref 150–400)
RBC: 4.37 MIL/uL (ref 3.87–5.11)
RDW: 12.6 % (ref 11.5–15.5)
WBC: 8.2 10*3/uL (ref 4.0–10.5)
nRBC: 0 % (ref 0.0–0.2)

## 2022-02-22 LAB — TYPE AND SCREEN
ABO/RH(D): O NEG
Antibody Screen: NEGATIVE

## 2022-02-22 LAB — APTT: aPTT: 31 seconds (ref 24–36)

## 2022-02-22 LAB — SARS CORONAVIRUS 2 BY RT PCR (HOSPITAL ORDER, PERFORMED IN ~~LOC~~ HOSPITAL LAB): SARS Coronavirus 2: NEGATIVE

## 2022-02-22 LAB — PROTIME-INR
INR: 1 (ref 0.8–1.2)
Prothrombin Time: 13.3 seconds (ref 11.4–15.2)

## 2022-02-22 NOTE — Pre-Procedure Instructions (Signed)
Surgical Instructions ? ? ? Your procedure is scheduled on Thursday, March 16. ? Report to [facility] Main Entrance "A" at 5:30 A.M., then check in with the Admitting office. ? Call this number if you have problems the morning of surgery: ? [phone] ? ? If you have any questions prior to your surgery date call [phone]: Open Monday-Friday 8am-4pm ? ? ? Remember: ? Do not eat or drink after midnight the night before your surgery ? ? Take these medicines the morning of surgery with A SIP OF WATER:  ?pantoprazole (PROTONIX)  ?venlafaxine XR (EFFEXOR-XR)  ?fluticasone (FLONASE)  ? ?acetaminophen (TYLENOL) if needed ?ALPRAZolam Duanne Moron) if needed ?baclofen (LIORESAL) if needed ?hyoscyamine (LEVSIN) if needed ?loratadine (CLARITIN) if needed ?carboxymethylcellulose (REFRESH PLUS)- if needed ?Olopatadine HCl (PATADAY)- if needed ? ?As of today, STOP taking any Aspirin (unless otherwise instructed by your surgeon), Diclofenac (Voltaren) gel, Aleve, Naproxen, Ibuprofen, Motrin, Advil, Goody's, BC's, all herbal medications, fish oil, and all vitamins. ? ?         ?Do not wear jewelry or makeup ?Do not wear lotions, powders, perfumes or deodorant. ?Do not shave 48 hours prior to surgery.   ?Do not bring valuables to the hospital. ?Do not wear nail polish, gel polish, artificial nails, or any other type of covering on natural nails (fingers and toes) ?If you have artificial nails or gel coating that need to be removed by a nail salon, please have this removed prior to surgery. Artificial nails or gel coating may interfere with anesthesia's ability to adequately monitor your vital signs. ? ?[facility] is not responsible for any belongings or valuables. .  ? ?Do NOT Smoke (Tobacco/Vaping)  24 hours prior to your procedure ? ?If you use a CPAP at night, you may bring your mask for your overnight stay. ?  ?Contacts, glasses, hearing aids, dentures or partials may not be worn into surgery, please bring cases for these  belongings ?  ?For patients admitted to the hospital, discharge time will be determined by your treatment team. ?  ?Patients discharged the day of surgery will not be allowed to drive home, and someone needs to stay with them for 24 hours. ? ?NO VISITORS WILL BE ALLOWED IN PRE-OP WHERE PATIENTS ARE PREPPED FOR SURGERY.  ONLY 1 SUPPORT PERSON MAY BE PRESENT IN THE WAITING ROOM WHILE YOU ARE IN SURGERY.  IF YOU ARE TO BE ADMITTED, ONCE YOU ARE IN YOUR ROOM YOU WILL BE ALLOWED TWO (2) VISITORS. 1 (ONE) VISITOR MAY STAY OVERNIGHT BUT MUST ARRIVE TO THE ROOM BY 8pm.  Minor children may have two parents present. Special consideration for safety and communication needs will be reviewed on a case by case basis. ? ?Special instructions:   ? ?Oral Hygiene is also important to reduce your risk of infection.  Remember - BRUSH YOUR TEETH THE MORNING OF SURGERY WITH YOUR REGULAR TOOTHPASTE ? ? ?[place]- Preparing For Surgery ? ?Before surgery, you can play an important role. Because skin is not sterile, your skin needs to be as free of germs as possible. You can reduce the number of germs on your skin by washing with CHG (chlorahexidine gluconate) Soap before surgery.  CHG is an antiseptic cleaner which kills germs and bonds with the skin to continue killing germs even after washing.   ? ? ?Please do not use if you have an allergy to CHG or antibacterial soaps. If your skin becomes reddened/irritated stop using the CHG.  ?Do not shave (including legs  and underarms) for at least 48 hours prior to first CHG shower. It is OK to shave your face. ? ?Please follow these instructions carefully. ?  ? ? Shower the NIGHT BEFORE SURGERY and the MORNING OF SURGERY with CHG Soap.  ? If you chose to wash your hair, wash your hair first as usual with your normal shampoo. After you shampoo, rinse your hair and body thoroughly to remove the shampoo.  Then [employer] and genitals (private parts) with your normal soap and rinse thoroughly to  remove soap. ? ?After that Use CHG Soap as you would any other liquid soap. You can apply CHG directly to the skin and wash gently with a scrungie or a clean washcloth.  ? ?Apply the CHG Soap to your body ONLY FROM THE NECK DOWN.  Do not use on open wounds or open sores. Avoid contact with your eyes, ears, mouth and genitals (private parts). Wash Face and genitals (private parts)  with your normal soap.  ? ?Wash thoroughly, paying special attention to the area where your surgery will be performed. ? ?Thoroughly rinse your body with warm water from the neck down. ? ?DO NOT shower/wash with your normal soap after using and rinsing off the CHG Soap. ? ?Pat yourself dry with a CLEAN TOWEL. ? ?Wear CLEAN PAJAMAS to bed the night before surgery ? ?Place CLEAN SHEETS on your bed the night before your surgery ? ?DO NOT SLEEP WITH PETS. ? ? ?Day of Surgery: ? ?Take a shower with CHG soap. ?Wear Clean/Comfortable clothing the morning of surgery ?Do not apply any deodorants/lotions.   ?Remember to brush your teeth WITH YOUR REGULAR TOOTHPASTE. ? ? ? ?COVID testing ? ?If you are going to stay overnight or be admitted after your procedure/surgery and require a pre-op COVID test, please follow these instructions after your COVID test  ? ?You are not required to quarantine however you are required to wear a well-fitting mask when you are out and around people not in your household.  If your mask becomes wet or soiled, replace with a new one. ? ?Wash your hands often with soap and water for 20 seconds or clean your hands with an alcohol-based hand sanitizer that contains at least 60% alcohol. ? ?Do not share personal items. ? ?Notify your provider: ?if you are in close contact with someone who has COVID  ?or if you develop a fever of 100.4 or greater, sneezing, cough, sore throat, shortness of breath or body aches. ? ?  ?Please read over the following fact sheets that you were given.  ? ?

## 2022-02-22 NOTE — Progress Notes (Signed)
PCP: Prince Solian, MD ?Cardiologist: denies ? ?EKG: 02/22/22 ?CXR:  02/22/22 ?ECHO:  denies ?Stress Test:  denies ?Cardiac Cath:  denies ? ?Fasting Blood Sugar-  na ?Checks Blood Sugar_na__ times a day ? ?ASA/Blood Thinner: No ? ?OSA/CPAP: No ? ?Covid test 02/22/22 at PAT ? ?Anesthesia Review: No ? ?Patient denies shortness of breath, fever, cough, and chest pain at PAT appointment. ? ?Patient verbalized understanding of instructions provided today at the PAT appointment.  Patient asked to review instructions at home and day of surgery.   ?

## 2022-02-23 NOTE — Anesthesia Preprocedure Evaluation (Addendum)
Anesthesia Evaluation  ?Patient identified by MRN, date of birth, ID band ?Patient awake ? ? ? ?Reviewed: ?Allergy & Precautions, NPO status , Patient's Chart, lab work & pertinent test results ? ?Airway ?Mallampati: III ? ?TM Distance: >3 FB ?Neck ROM: Full ? ? ? Dental ? ?(+) Teeth Intact, Dental Advisory Given ?  ?Pulmonary ?former smoker,  ?  ?Pulmonary exam normal ?breath sounds clear to auscultation ? ? ? ? ? ? Cardiovascular ?hypertension, Normal cardiovascular exam ?Rhythm:Regular Rate:Normal ? ? ?  ?Neuro/Psych ? Headaches, PSYCHIATRIC DISORDERS Anxiety Depression CVA, No Residual Symptoms   ? GI/Hepatic ?Neg liver ROS, hiatal hernia, GERD  Medicated and Poorly Controlled,  ?Endo/Other  ?negative endocrine ROS ? Renal/GU ?negative Renal ROS  ? ?  ?Musculoskeletal ? ?(+) Arthritis ,  ? Abdominal ?  ?Peds ? Hematology ?negative hematology ROS ?(+)   ?Anesthesia Other Findings ? ? Reproductive/Obstetrics ? ?  ? ? ? ? ? ? ? ? ? ? ? ? ? ?  ?  ? ? ? ? ? ? ? ?Anesthesia Physical ?Anesthesia Plan ? ?ASA: 3 ? ?Anesthesia Plan: General  ? ?Post-op Pain Management: Tylenol PO (pre-op)*  ? ?Induction: Intravenous ? ?PONV Risk Score and Plan: 3 and Dexamethasone and Ondansetron ? ?Airway Management Planned: Oral ETT ? ?Additional Equipment:  ? ?Intra-op Plan:  ? ?Post-operative Plan: Extubation in OR ? ?Informed Consent: I have reviewed the patients History and Physical, chart, labs and discussed the procedure including the risks, benefits and alternatives for the proposed anesthesia with the patient or authorized representative who has indicated his/her understanding and acceptance.  ? ? ? ?Dental advisory given ? ?Plan Discussed with: CRNA ? ?Anesthesia Plan Comments: (CLEAR SIGHT, 2nd large bore PIV after induction)  ? ? ? ? ? ?Anesthesia Quick Evaluation ? ?

## 2022-02-24 ENCOUNTER — Observation Stay (HOSPITAL_COMMUNITY)
Admission: AD | Admit: 2022-02-24 | Discharge: 2022-02-25 | Disposition: A | Discharge status: Home / Self Care | Payer: Medicare Other | Attending: Thoracic Surgery (Cardiothoracic Vascular Surgery) | Admitting: Thoracic Surgery (Cardiothoracic Vascular Surgery)

## 2022-02-24 ENCOUNTER — Encounter (HOSPITAL_COMMUNITY)
Admission: AD | Disposition: A | Discharge status: Home / Self Care | Payer: Self-pay | Source: Home / Self Care | Attending: Thoracic Surgery (Cardiothoracic Vascular Surgery)

## 2022-02-24 ENCOUNTER — Encounter (HOSPITAL_COMMUNITY): Payer: Self-pay | Admitting: Thoracic Surgery (Cardiothoracic Vascular Surgery)

## 2022-02-24 ENCOUNTER — Ambulatory Visit (HOSPITAL_COMMUNITY): Payer: Medicare Other | Admitting: Anesthesiology

## 2022-02-24 ENCOUNTER — Inpatient Hospital Stay (HOSPITAL_COMMUNITY): Payer: Medicare Other

## 2022-02-24 ENCOUNTER — Ambulatory Visit (HOSPITAL_BASED_OUTPATIENT_CLINIC_OR_DEPARTMENT_OTHER): Payer: Medicare Other | Admitting: Anesthesiology

## 2022-02-24 ENCOUNTER — Other Ambulatory Visit: Payer: Self-pay

## 2022-02-24 DIAGNOSIS — Z79899 Other long term (current) drug therapy: Secondary | ICD-10-CM | POA: Insufficient documentation

## 2022-02-24 DIAGNOSIS — Z87891 Personal history of nicotine dependence: Secondary | ICD-10-CM | POA: Insufficient documentation

## 2022-02-24 DIAGNOSIS — F418 Other specified anxiety disorders: Secondary | ICD-10-CM

## 2022-02-24 DIAGNOSIS — K219 Gastro-esophageal reflux disease without esophagitis: Secondary | ICD-10-CM | POA: Insufficient documentation

## 2022-02-24 DIAGNOSIS — K2100 Gastro-esophageal reflux disease with esophagitis, without bleeding: Secondary | ICD-10-CM | POA: Diagnosis not present

## 2022-02-24 DIAGNOSIS — I10 Essential (primary) hypertension: Secondary | ICD-10-CM

## 2022-02-24 DIAGNOSIS — K449 Diaphragmatic hernia without obstruction or gangrene: Secondary | ICD-10-CM

## 2022-02-24 DIAGNOSIS — K209 Esophagitis, unspecified without bleeding: Secondary | ICD-10-CM | POA: Diagnosis not present

## 2022-02-24 DIAGNOSIS — Z96652 Presence of left artificial knee joint: Secondary | ICD-10-CM | POA: Diagnosis not present

## 2022-02-24 DIAGNOSIS — J939 Pneumothorax, unspecified: Secondary | ICD-10-CM

## 2022-02-24 HISTORY — PX: XI ROBOTIC ASSISTED HIATAL HERNIA REPAIR: SHX6889

## 2022-02-24 HISTORY — PX: ESOPHAGOGASTRODUODENOSCOPY: SHX5428

## 2022-02-24 LAB — GLUCOSE, CAPILLARY
Glucose-Capillary: 120 mg/dL — ABNORMAL HIGH (ref 70–99)
Glucose-Capillary: 123 mg/dL — ABNORMAL HIGH (ref 70–99)
Glucose-Capillary: 138 mg/dL — ABNORMAL HIGH (ref 70–99)
Glucose-Capillary: 142 mg/dL — ABNORMAL HIGH (ref 70–99)

## 2022-02-24 SURGERY — REPAIR, HERNIA, HIATAL, ROBOT-ASSISTED
Anesthesia: General | Site: Chest

## 2022-02-24 MED ORDER — ONDANSETRON HCL 4 MG/2ML IJ SOLN: 4.0000 mg | Freq: Once | INTRAMUSCULAR | Status: DC | PRN

## 2022-02-24 MED ORDER — PHENYLEPHRINE 40 MCG/ML (10ML) SYRINGE FOR IV PUSH (FOR BLOOD PRESSURE SUPPORT)
PREFILLED_SYRINGE | INTRAVENOUS | Status: DC | PRN
  Administered 2022-02-24: 120 ug via INTRAVENOUS

## 2022-02-24 MED ORDER — PROPOFOL 10 MG/ML IV BOLUS
INTRAVENOUS | Status: DC | PRN
  Administered 2022-02-24: 30 mg via INTRAVENOUS
  Administered 2022-02-24: 100 mg via INTRAVENOUS

## 2022-02-24 MED ORDER — FENTANYL CITRATE (PF) 100 MCG/2ML IJ SOLN: 25.0000 ug | INTRAMUSCULAR | Status: DC | PRN

## 2022-02-24 MED ORDER — SUGAMMADEX SODIUM 200 MG/2ML IV SOLN
INTRAVENOUS | Status: DC | PRN
  Administered 2022-02-24: 200 mg via INTRAVENOUS

## 2022-02-24 MED ORDER — DEXAMETHASONE SODIUM PHOSPHATE 10 MG/ML IJ SOLN
INTRAMUSCULAR | Status: DC | PRN
Start: 2022-02-24 — End: 2022-02-24
  Administered 2022-02-24: 10 mg via INTRAVENOUS

## 2022-02-24 MED ORDER — CHLORHEXIDINE GLUCONATE 0.12 % MT SOLN
OROMUCOSAL | Status: AC
  Administered 2022-02-24: 15 mL
  Filled 2022-02-24: qty 15

## 2022-02-24 MED ORDER — LIDOCAINE 2% (20 MG/ML) 5 ML SYRINGE
INTRAMUSCULAR | Status: DC | PRN
  Administered 2022-02-24: 60 mg via INTRAVENOUS

## 2022-02-24 MED ORDER — LACTATED RINGERS IV SOLN
INTRAVENOUS | Status: DC | PRN
Start: 2022-02-24 — End: 2022-02-24

## 2022-02-24 MED ORDER — FENTANYL CITRATE (PF) 250 MCG/5ML IJ SOLN
INTRAMUSCULAR | Status: AC
  Filled 2022-02-24: qty 5

## 2022-02-24 MED ORDER — 0.9 % SODIUM CHLORIDE (POUR BTL) OPTIME
TOPICAL | Status: DC | PRN
  Administered 2022-02-24: 2000 mL

## 2022-02-24 MED ORDER — KETOROLAC TROMETHAMINE 15 MG/ML IJ SOLN
15.0000 mg | Freq: Four times a day (QID) | INTRAMUSCULAR | Status: DC
  Administered 2022-02-24 – 2022-02-25 (×4): 15 mg via INTRAVENOUS
  Filled 2022-02-24 (×4): qty 1

## 2022-02-24 MED ORDER — ENOXAPARIN SODIUM 40 MG/0.4ML IJ SOSY
40.0000 mg | PREFILLED_SYRINGE | Freq: Every day | INTRAMUSCULAR | Status: DC
  Administered 2022-02-24: 40 mg via SUBCUTANEOUS
  Filled 2022-02-24: qty 0.4

## 2022-02-24 MED ORDER — ONDANSETRON HCL 4 MG/2ML IJ SOLN: 4.0000 mg | Freq: Four times a day (QID) | INTRAMUSCULAR | Status: DC | PRN

## 2022-02-24 MED ORDER — CEFAZOLIN SODIUM-DEXTROSE 2-4 GM/100ML-% IV SOLN
2.0000 g | Freq: Three times a day (TID) | INTRAVENOUS | Status: AC
  Administered 2022-02-24 (×2): 2 g via INTRAVENOUS
  Filled 2022-02-24 (×2): qty 100

## 2022-02-24 MED ORDER — ONDANSETRON HCL 4 MG/2ML IJ SOLN
INTRAMUSCULAR | Status: DC | PRN
  Administered 2022-02-24: 4 mg via INTRAVENOUS

## 2022-02-24 MED ORDER — CEFAZOLIN SODIUM-DEXTROSE 2-4 GM/100ML-% IV SOLN
2.0000 g | INTRAVENOUS | Status: AC
  Administered 2022-02-24: 2 g via INTRAVENOUS
  Filled 2022-02-24: qty 100

## 2022-02-24 MED ORDER — FLUTICASONE PROPIONATE 50 MCG/ACT NA SUSP
1.0000 | Freq: Every day | NASAL | Status: DC
  Administered 2022-02-24 – 2022-02-25 (×2): 1 via NASAL
  Filled 2022-02-24: qty 16

## 2022-02-24 MED ORDER — FENTANYL CITRATE (PF) 250 MCG/5ML IJ SOLN
INTRAMUSCULAR | Status: DC | PRN
  Administered 2022-02-24 (×5): 50 ug via INTRAVENOUS

## 2022-02-24 MED ORDER — LACTATED RINGERS IV SOLN: INTRAVENOUS | Status: DC | PRN

## 2022-02-24 MED ORDER — DEXAMETHASONE SODIUM PHOSPHATE 10 MG/ML IJ SOLN
INTRAMUSCULAR | Status: AC
  Filled 2022-02-24: qty 1

## 2022-02-24 MED ORDER — LIDOCAINE 2% (20 MG/ML) 5 ML SYRINGE
INTRAMUSCULAR | Status: AC
  Filled 2022-02-24: qty 5

## 2022-02-24 MED ORDER — INSULIN ASPART 100 UNIT/ML IJ SOLN
0.0000 [IU] | Freq: Four times a day (QID) | INTRAMUSCULAR | Status: DC
  Administered 2022-02-24 (×2): 2 [IU] via SUBCUTANEOUS

## 2022-02-24 MED ORDER — ACETAMINOPHEN 500 MG PO TABS
1000.0000 mg | ORAL_TABLET | Freq: Once | ORAL | Status: AC
  Administered 2022-02-24: 1000 mg via ORAL
  Filled 2022-02-24: qty 2

## 2022-02-24 MED ORDER — PHENYLEPHRINE 40 MCG/ML (10ML) SYRINGE FOR IV PUSH (FOR BLOOD PRESSURE SUPPORT)
PREFILLED_SYRINGE | INTRAVENOUS | Status: AC
  Filled 2022-02-24: qty 10

## 2022-02-24 MED ORDER — CHLORHEXIDINE GLUCONATE 0.12 % MT SOLN: 15.0000 mL | OROMUCOSAL | Status: DC

## 2022-02-24 MED ORDER — BUPIVACAINE HCL (PF) 0.5 % IJ SOLN
INTRAMUSCULAR | Status: AC
  Filled 2022-02-24: qty 30

## 2022-02-24 MED ORDER — ONDANSETRON HCL 4 MG/2ML IJ SOLN
INTRAMUSCULAR | Status: AC
  Filled 2022-02-24: qty 2

## 2022-02-24 MED ORDER — PROPOFOL 10 MG/ML IV BOLUS
INTRAVENOUS | Status: AC
  Filled 2022-02-24: qty 20

## 2022-02-24 MED ORDER — ROCURONIUM BROMIDE 10 MG/ML (PF) SYRINGE
PREFILLED_SYRINGE | INTRAVENOUS | Status: AC
  Filled 2022-02-24: qty 10

## 2022-02-24 MED ORDER — BUPIVACAINE LIPOSOME 1.3 % IJ SUSP
INTRAMUSCULAR | Status: AC
  Filled 2022-02-24: qty 20

## 2022-02-24 MED ORDER — MORPHINE SULFATE (PF) 2 MG/ML IV SOLN: 1.0000 mg | INTRAVENOUS | Status: DC | PRN

## 2022-02-24 MED ORDER — BUPIVACAINE HCL (PF) 0.5 % IJ SOLN
INTRAMUSCULAR | Status: DC | PRN
  Administered 2022-02-24: 50 mL

## 2022-02-24 MED ORDER — ROCURONIUM BROMIDE 10 MG/ML (PF) SYRINGE
PREFILLED_SYRINGE | INTRAVENOUS | Status: DC | PRN
  Administered 2022-02-24: 60 mg via INTRAVENOUS
  Administered 2022-02-24 (×2): 20 mg via INTRAVENOUS

## 2022-02-24 SURGICAL SUPPLY — 70 items
BLADE SURG 11 STRL SS (BLADE) ×3 IMPLANT
BUTTON OLYMPUS DEFENDO 5 PIECE (MISCELLANEOUS) ×3 IMPLANT
CANISTER SUCT 3000ML PPV (MISCELLANEOUS) ×5 IMPLANT
CANNULA REDUC XI 12-8 STAPL (CANNULA)
CANNULA REDUCER 12-8 DVNC XI (CANNULA) IMPLANT
CATH THORACIC 28FR (CATHETERS) IMPLANT
CNTNR URN SCR LID CUP LEK RST (MISCELLANEOUS) ×2 IMPLANT
CONT SPEC 4OZ STRL OR WHT (MISCELLANEOUS) ×12
COVER BACK TABLE 60X90IN (DRAPES) IMPLANT
DEFOGGER SCOPE WARMER CLEARIFY (MISCELLANEOUS) ×3 IMPLANT
DERMABOND ADVANCED (GAUZE/BANDAGES/DRESSINGS) ×1
DERMABOND ADVANCED .7 DNX12 (GAUZE/BANDAGES/DRESSINGS) ×2 IMPLANT
DEVICE SUTURE ENDOST 10MM (ENDOMECHANICALS) IMPLANT
DRAPE ARM DVNC X/XI (DISPOSABLE) ×8 IMPLANT
DRAPE COLUMN DVNC XI (DISPOSABLE) ×2 IMPLANT
DRAPE CV SPLIT W-CLR ANES SCRN (DRAPES) ×3 IMPLANT
DRAPE DA VINCI XI ARM (DISPOSABLE) ×12
DRAPE DA VINCI XI COLUMN (DISPOSABLE) ×3
DRAPE INCISE IOBAN 66X45 STRL (DRAPES) IMPLANT
DRAPE ORTHO SPLIT 77X108 STRL (DRAPES) ×3
DRAPE SURG ORHT 6 SPLT 77X108 (DRAPES) ×2 IMPLANT
ELECT REM PT RETURN 9FT ADLT (ELECTROSURGICAL) ×3
ELECTRODE REM PT RTRN 9FT ADLT (ELECTROSURGICAL) ×2 IMPLANT
FELT TEFLON 1X6 (MISCELLANEOUS) IMPLANT
GLOVE SURG ENC MOIS LTX SZ7.5 (GLOVE) ×5 IMPLANT
GOWN STRL REUS W/ TWL LRG LVL3 (GOWN DISPOSABLE) ×2 IMPLANT
GOWN STRL REUS W/ TWL XL LVL3 (GOWN DISPOSABLE) ×4 IMPLANT
GOWN STRL REUS W/TWL 2XL LVL3 (GOWN DISPOSABLE) ×3 IMPLANT
GOWN STRL REUS W/TWL LRG LVL3 (GOWN DISPOSABLE) ×12
GOWN STRL REUS W/TWL XL LVL3 (GOWN DISPOSABLE) ×6
GRAFT MYRIAD 5 LAYER 7X10 (Graft) ×1 IMPLANT
HEMOSTAT SURGICEL 2X14 (HEMOSTASIS) ×2 IMPLANT
IV NS 1000ML (IV SOLUTION) ×6
IV NS 1000ML BAXH (IV SOLUTION) IMPLANT
KIT BASIN OR (CUSTOM PROCEDURE TRAY) ×3 IMPLANT
KIT TURNOVER KIT B (KITS) ×3 IMPLANT
NS IRRIG 1000ML POUR BTL (IV SOLUTION) ×6 IMPLANT
OBTURATOR OPTICAL STANDARD 8MM (TROCAR) ×3
OBTURATOR OPTICAL STND 8 DVNC (TROCAR) ×2
OBTURATOR OPTICALSTD 8 DVNC (TROCAR) IMPLANT
PACK CHEST (CUSTOM PROCEDURE TRAY) ×3 IMPLANT
PAD ARMBOARD 7.5X6 YLW CONV (MISCELLANEOUS) ×6 IMPLANT
SEAL CANN UNIV 5-8 DVNC XI (MISCELLANEOUS) ×8 IMPLANT
SEAL XI 5MM-8MM UNIVERSAL (MISCELLANEOUS) ×12
SEALER SYNCHRO 8 IS4000 DV (MISCELLANEOUS) ×3
SEALER SYNCHRO 8 IS4000 DVNC (MISCELLANEOUS) IMPLANT
SPONGE T-LAP 18X18 ~~LOC~~+RFID (SPONGE) ×9 IMPLANT
SUT ETHIBOND 0 36 GRN (SUTURE) ×6 IMPLANT
SUT SILK  1 MH (SUTURE) ×3
SUT SILK 1 MH (SUTURE) ×2 IMPLANT
SUT SURGIDAC NAB ES-9 0 48 120 (SUTURE) IMPLANT
SUT VIC AB 3-0 SH 27 (SUTURE) ×3
SUT VIC AB 3-0 SH 27X BRD (SUTURE) IMPLANT
SUT VIC AB 3-0 X1 27 (SUTURE) ×2 IMPLANT
SUT VICRYL 0 TIES 12 18 (SUTURE) ×1 IMPLANT
SUT VICRYL 0 UR6 27IN ABS (SUTURE) ×6 IMPLANT
SYR 10ML LL (SYRINGE) IMPLANT
SYR 20ML ECCENTRIC (SYRINGE) ×3 IMPLANT
SYR 30ML SLIP (SYRINGE) ×3 IMPLANT
SYR 50ML LL SCALE MARK (SYRINGE) IMPLANT
SYSTEM SAHARA CHEST DRAIN ATS (WOUND CARE) ×2 IMPLANT
TOWEL GREEN STERILE (TOWEL DISPOSABLE) ×3 IMPLANT
TOWEL GREEN STERILE FF (TOWEL DISPOSABLE) ×3 IMPLANT
TRAY FOLEY MTR SLVR 16FR STAT (SET/KITS/TRAYS/PACK) ×3 IMPLANT
TROCAR PORT AIRSEAL 8X120 (TROCAR) IMPLANT
TROCAR XCEL BLADELESS 5X75MML (TROCAR) ×3 IMPLANT
TUBE CONNECTING 20X1/4 (TUBING) ×3 IMPLANT
TUBING ENDO SMARTCAP (MISCELLANEOUS) ×3 IMPLANT
UNDERPAD 30X36 HEAVY ABSORB (UNDERPADS AND DIAPERS) ×3 IMPLANT
WATER STERILE IRR 1000ML POUR (IV SOLUTION) ×3 IMPLANT

## 2022-02-24 NOTE — Anesthesia Procedure Notes (Signed)
Procedure Name: Intubation ?Date/Time: 02/24/2022 7:54 AM ?Performed by: Santa Lighter, MD ?Pre-anesthesia Checklist: Patient identified ?Patient Re-evaluated:Patient Re-evaluated prior to induction ?Oxygen Delivery Method: Circle system utilized ?Preoxygenation: Pre-oxygenation with 100% oxygen ?Induction Type: IV induction ?Ventilation: Mask ventilation without difficulty ?Laryngoscope Size: Mac and 3 ?Grade View: Grade II ?Tube type: Oral ?Tube size: 7.0 mm ?Number of attempts: 2 (Attempt x1 by EMT student, esophageal intubation) ?Airway Equipment and Method: Stylet and Oral airway ?Placement Confirmation: ETT inserted through vocal cords under direct vision, positive ETCO2 and breath sounds checked- equal and bilateral ?Secured at: 22 cm ?Tube secured with: Tape ?Dental Injury: Teeth and Oropharynx as per pre-operative assessment  ? ? ? ? ?

## 2022-02-24 NOTE — Anesthesia Postprocedure Evaluation (Signed)
Anesthesia Post Note ? ?Patient: Heather Lindsey ? ?Procedure(s) Performed: XI ROBOTIC ASSISTED HIATAL HERNIA REPAIR with Fundoplication using Myriad Matrix (Chest) ?ESOPHAGOGASTRODUODENOSCOPY (EGD) ? ?  ? ?Patient location during evaluation: PACU ?Anesthesia Type: General ?Level of consciousness: awake and alert ?Pain management: pain level controlled ?Vital Signs Assessment: post-procedure vital signs reviewed and stable ?Respiratory status: spontaneous breathing, nonlabored ventilation, respiratory function stable and patient connected to nasal cannula oxygen ?Cardiovascular status: blood pressure returned to baseline and stable ?Postop Assessment: no apparent nausea or vomiting ?Anesthetic complications: no ? ? ?No notable events documented. ? ?Last Vitals:  ?Vitals:  ? 02/24/22 1516 02/24/22 1913  ?BP: 127/80 (!) 148/80  ?Pulse: 92 91  ?Resp: 12 13  ?Temp: 37.1 ?C 37.3 ?C  ?SpO2: 91% 93%  ?  ?Last Pain:  ?Vitals:  ? 02/24/22 1915  ?TempSrc:   ?PainSc: 0-No pain  ? ? ?  ?  ?  ?  ?  ?  ? ?Santa Lighter ? ? ? ? ?

## 2022-02-24 NOTE — Op Note (Signed)
? ?   ?[address] ?      [place] [address] ?            [phone]      ? ? ?02/24/2022 ? ?Patient:  Heather Lindsey ?Pre-Op Dx: Hiatal Hernia ?GERD ?Esophagitis   ?Post-op Dx:  same ?Procedure: ?- Esophagoscopy ?- Robotic assisted laparoscopy ?- Hiatal hernia repair with buttressed 5 layer Myriad mesh ?- Nissen Fundoplication ? ? ?Surgeon and Role:   ?   * Lajuana Matte, MD - Primary ? ?Assistant: Josie Saunders, PA-C  ?An experienced assistant was required given the complexity of this surgery and the standard of surgical care. The assistant was needed for exposure, dissection, suctioning, retraction of delicate tissues and sutures, instrument exchange and for overall help during this procedure.   ?Anesthesia  general ?EBL:  2m ?Blood Administration: none ?Specimen:  none ? ? ?Counts: correct ? ? ?Indications: ?76year old female with history of gastroesophageal reflux, and grade C esophagitis on most recent endoscopy I personally reviewed her manometry as well as her cross-sectional imaging.  In regards to her manometry her GE junction shows normal pressures over there is evidence of a hiatal hernia.  She also has normal peristalsis of her esophagus.  Review of her CT scan she does have a small sliding hernia, and there is concern for an interstitial pattern in her lungs, which may be related to chronic reflux and aspiration.  We discussed the risks and benefits for a robotic assisted hiatal hernia repair with Nissen fundoplication and she is agreeable to proceed. ? ?Findings: ?Significant esophagitis.  Moderate hiatal hernia.  Repaired with 3 stiches. ? ?Operative Technique: ?After the risks, benefits and alternatives were thoroughly discussed, the patient was brought to the operative theatre.  Anesthesia was induced, and the esophagoscope was passed through the oropharynx down to the stomach.  The scope was retroflexed and the hiatal hernia was clearly evident.  The scope was pulled back  and the mucosal surface of the esophagus was visualized.    There was significant esophagitis up through the mid esphagus.  The scope was then parked at 25 cm from the incisors.  The patient was then prepped and draped in normal sterile fashion.  An appropriate surgical pause was performed, and pre-operative antibiotics were dosed accordingly. ? ?We began with a 1 cm incision 15 cm caudad from the xiphoid and slightly lateral to the umbilicus.  Using an Optiview we entered the peritoneal space.  The abdomen was then insufflated with CO2.  3 other robotic ports were placed to triangulate the hiatus.  Another 8 mm port was placed in place at the level of the umbilicus laterally for an assistant port and another 5 mm trocar was placed in the right lower quadrant for liver retractor.  The patient was then placed in steep reverse Trendelenburg and the liver was elevated to expose the esophageal hiatus.  And then the robot was docked. ? ?We began by dividing the gastrohepatic ligament to expose the right diaphragmatic crus and then dissected the hernia sac in a clockwise fashion to mobilize there the stomach and esophagus.  We then divided the short gastrics and moved towards the right crus and completed our dissection along the esophageal hiatus.  A Penrose drain was then used to encircle the the esophagus and we continued our dissection up into the mediastinum.  Once we had achieved 3 to 4 cm of intra-abdominal esophagus we then proceeded to reapproximate the crura with 0  Ethibond sutures in an interrupted fashion.  Myriad mesh was used for buttressed.  The gastroscope was passed down through the lower esophageal sphincter into the stomach and would act as our bougie during this repair.  Next the stomach was passed posterior to the esophagus and Nissen fundoplication was performed.  An air leak test was performed using the gastroscope.  No leak was evident.  The liver retractor was removed and all ports were removed  under direct visualization.  The skin and soft tissue were closed with absorbable suture   ? ?The patient tolerated the procedure without any immediate complications, and was transferred to the PACU in stable condition. ? ?Lajuana Matte ? ?

## 2022-02-24 NOTE — Discharge Instructions (Addendum)
? ?  1.Increase activity slowly. ?2.Walk daily and increase frequency and duration as tolerates. ?3.May walk up steps. ?4.No lifting more than ten pounds for two weeks. ?5.No driving until not taking pain medication ?6.Continue with breathing exercises daily. ? ?DIET:  Soft diet (baby food like until instructed otherwise) ?Please avoid carbonated beverages and caffeine (until instructed otherwise) ? ?WOUND: ? ?1.May shower. ?2.Clean wounds with mild soap and water.  Call the office at [phone] if any problems arise.  ?

## 2022-02-24 NOTE — Interval H&P Note (Signed)
History and Physical Interval Note: ? ?02/24/2022 ?7:33 AM ? ?Heather Lindsey  has presented today for surgery, with the diagnosis of Hiatal Hernia.  The various methods of treatment have been discussed with the patient and family. After consideration of risks, benefits and other options for treatment, the patient has consented to  Procedure(s): ?XI ROBOTIC [place] with Fundoplication (N/A) ?ESOPHAGOGASTRODUODENOSCOPY (EGD) (N/A) as a surgical intervention.  The patient's history has been reviewed, patient examined, no change in status, stable for surgery.  I have reviewed the patient's chart and labs.  Questions were answered to the patient's satisfaction.   ? ? ?Lucile Crater Shlonda Dolloff ? ? ?

## 2022-02-24 NOTE — Discharge Summary (Signed)
Physician Discharge Summary  ? ? ?   ?[address] ?      [place] [address] ?            [phone]   ? ?Patient ID: ?Heather Lindsey ?MRN: [id number] ?DOB/AGE: 06-29-1946 76 y.o. ? ?Admit date: 02/24/2022 ?Discharge date: 02/25/2022 ? ?Admission Diagnoses: ?Hiatal Hernia ? ?Discharge Diagnoses:  ?S/p esophagoscopy, robotic assisted laparoscopy, hiatal hernia repair with Myriad mesh, and Nissen fundoplication ?History of the following ?Anxiety     ? 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 Brain bleed ([place])    ? Circumscribed scleroderma    ? Depression    ? Diverticulosis    ? GERD (gastroesophageal reflux disease)    ? Hyperlipidemia    ? IBS (irritable bowel syndrome)    ? Memory loss    ? Migraines    ? Osteoarthritis    ? Osteoporosis    ? Stroke [facility])   ? ? ? ?Consults: None ? ?Procedure (s):  ?Esophagoscopy ?- Robotic assisted laparoscopy ?- Hiatal hernia repair with buttressed 5 layer Myriad mesh ?- Nissen Fundoplication by Dr. Kipp Brood on 02/24/2022. ? ?HPI: ?This is a 76 y.o. female presents for surgical evaluation of gastroesophageal reflux.  She endorses a long history of reflux which has worsened over the last several years.  She has been placed on Protonix twice daily but continue use to have midepigastric burning sensation.  She also admits to coughing spells most mornings due to excess secretions.  She denies any dysphagia or odynophagia.  She has been having some mild shortness of breath but thinks that this is related mostly to age. ? ?With history of gastroesophageal reflux and grade C esophagitis on most recent endoscopy, Dr. Kipp Brood reviewed her manometry as well as her cross-sectional imaging.  In regards to her manometry, her GE junction shows normal pressures over there is evidence of a hiatal hernia.  She also has normal peristalsis of her esophagus.  Review of her CT scan she does have a small sliding hernia, and there is concern for an interstitial  pattern in her lungs, which may be related to chronic reflux and aspiration.  Dr. Kipp Brood and the patient discussed the risks and benefits for a robotic assisted hiatal hernia repair with Nissen fundoplication and she is agreeable to proceed. ? ?Hospital Course: ?Patient underwent an esophagoscopy, robotic assisted laparoscopy, paraesophageal hernia repair with buttressed 5 layer Myriad mesh, and Nissen fundoplication. Patient was extubated in the OR and transferred from the OR to PACU in stable condition. Follow up chest x ray showed no pneumothorax, left base consolidation (atelectasis). Patient remained NPO and esophagram done 03/17 showed a narrowing at the GE junction with mild hold up of contrast (likely from fundoplication) but no leak. Marland Kitchen She was put on a Dysphagia I diet. She tolerated the diet without nausea or vomiting. Per patient, she had no difficulty swallowing. Her wounds are clean, dry, and healing without signs of infection. She is ambulating on room air. She is felt surgically stable for discharge today and patient would like to go home. We reviewed diet and activity, which is included with her discharge instructions.  ? ? ?Latest Vital Signs: ?Blood pressure 140/68, pulse (!) 104, temperature 97.8 ?F (36.6 ?C), temperature source Oral, resp. rate 17, height _0  (1.626 m), weight 72.6 kg, SpO2 93 %. ? ?Physical Exam: ?Cardiovascular: RRR ?Pulmonary: Clear to auscultation bilaterally ?Abdomen: Soft, non tender, bowel sounds present. ?Extremities: No  lower extremity edema. ?Wounds: Clean and dry.  No erythema or signs of infection. ?Discharge Condition:Stable and discharged to home. ? ?Recent laboratory studies:  ?Lab Results  ?Component Value Date  ? WBC 11.2 (H) 02/25/2022  ? HGB 11.0 (L) 02/25/2022  ? HCT 33.4 (L) 02/25/2022  ? MCV 89.3 02/25/2022  ? PLT 218 02/25/2022  ? ?Lab Results  ?Component Value Date  ? NA 135 02/25/2022  ? K 4.0 02/25/2022  ? CL 100 02/25/2022  ? CO2 25 02/25/2022  ?  CREATININE 0.87 02/25/2022  ? GLUCOSE 114 (H) 02/25/2022  ? ? ?  ?Diagnostic Studies: DG Chest 2 View ? ?Result Date: 02/23/2022 ?CLINICAL DATA:  Pre-admission for hiatal hernia repair. EXAM: CHEST - 2 VIEW COMPARISON:  01/06/2019. FINDINGS: The heart size and mediastinal contours are within normal limits. No consolidation, effusion, or pneumothorax. No acute osseous abnormality. IMPRESSION: No active cardiopulmonary disease. Electronically Signed   By: Brett Fairy M.D.   On: 02/23/2022 00:55  ? ?CT CHEST W CONTRAST ? ?Result Date: 01/28/2022 ?CLINICAL DATA:  76 year old female with history of hiatal hernia. Gastroesophageal reflux disease with esophagitis. EXAM: CT CHEST WITH CONTRAST TECHNIQUE: Multidetector CT imaging of the chest was performed during intravenous contrast administration. RADIATION DOSE REDUCTION: This exam was performed according to the departmental dose-optimization program which includes automated exposure control, adjustment of the mA and/or kV according to patient size and/or use of iterative reconstruction technique. CONTRAST:  37m OMNIPAQUE IOHEXOL 300 MG/ML  SOLN COMPARISON:  No priors. FINDINGS: Cardiovascular: Heart size is normal. There is no significant pericardial fluid, thickening or pericardial calcification. Aortic atherosclerosis. No definite coronary artery calcifications. Mediastinum/Nodes: No pathologically enlarged mediastinal or hilar lymph nodes. Small hiatal hernia. Prominent but nonenlarged paraesophageal lymph nodes measuring up to 5 mm in short axis, nonspecific. No axillary lymphadenopathy. Lungs/Pleura: Scattered areas of septal thickening, mild subpleural reticulation, thickening of the peribronchovascular interstitium an occasional mild cylindrical bronchiectasis and peripheral bronchiolectasis are noted in the lungs bilaterally, with a very mild craniocaudal gradient. No frank honeycombing. No acute consolidative airspace disease. No pleural effusions. Upper  Abdomen: Aortic atherosclerosis. Musculoskeletal: There are no aggressive appearing lytic or blastic lesions noted in the visualized portions of the skeleton. IMPRESSION: 1. Small hiatal hernia. Prominent but nonenlarged paraesophageal lymph nodes are noted, which are nonspecific, but likely reactive given the patient's history of esophagitis. 2. Findings in the lungs are concerning for potential interstitial lung disease. Outpatient referral to Pulmonology for further clinical evaluation is recommended. Follow-up high-resolution chest CT is suggested in 6-12 months to assess for temporal changes in the appearance of the lung parenchyma. 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 3. Aortic atherosclerosis. Aortic Atherosclerosis (ICD10-I70.0). Electronically Signed   By: DVinnie LangtonM.D.   On: 01/28/2022 06:32  ? ?DG Chest Port 1 View ? ?Result Date: 02/24/2022 ?CLINICAL DATA:  Pneumothorax. Status post robotic associated laparoscopy with hiatal hernia repair and Nissen fundoplication earlier today. EXAM: PORTABLE CHEST 1 VIEW COMPARISON:  02/22/2022 FINDINGS: 1043 hours. No discernible pneumothorax on the current film. Right lung clear. Patchy opacity is superimposed on the left hilum with streaky new retrocardiac left base collapse/consolidation. No substantial pleural effusion. Telemetry leads overlie the chest. IMPRESSION: 1. No evidence for pneumothorax on the current film. 2. New streaky retrocardiac left base collapse/consolidation with patchy opacity superimposed on the left hilum likely reflects atelectasis. Electronically Signed   By: EMisty StanleyM.D.   On: 02/24/2022  10:57  ? ?DG ESOPHAGUS W SINGLE CM (SOL OR THIN BA) ? ?Result Date: 02/25/2022 ?CLINICAL DATA:  Post Nissen fundoplication EXAM: ESOPHOGRAM/BARIUM SWALLOW TECHNIQUE: Single contrast examination was performed using water soluble Omnipaque 300. FLUOROSCOPY:  Radiation Exposure Index (as provided by the fluoroscopic device): 3.1 mGy mGy Kerma COMPARISON:  None. FINDINGS: Patient swallowed contrast without difficulty. There is narrowing at the gastroesophageal juncti

## 2022-02-24 NOTE — Transfer of Care (Signed)
Immediate Anesthesia Transfer of Care Note ? ?Patient: Heather Lindsey ? ?Procedure(s) Performed: XI ROBOTIC ASSISTED HIATAL HERNIA REPAIR with Fundoplication using Myriad Matrix (Chest) ?ESOPHAGOGASTRODUODENOSCOPY (EGD) ? ?Patient Location: PACU ? ?Anesthesia Type:General ? ?Level of Consciousness: awake and drowsy ? ?Airway & Oxygen Therapy: Patient Spontanous Breathing ? ?Post-op Assessment: Report given to RN, Post -op Vital signs reviewed and stable and Patient moving all extremities X 4 ? ?Post vital signs: Reviewed and stable ? ?Last Vitals:  ?Vitals Value Taken Time  ?BP 131/73 02/24/22 1024  ?Temp    ?Pulse 95 02/24/22 1026  ?Resp 13 02/24/22 1026  ?SpO2 91 % 02/24/22 1026  ?Vitals shown include unvalidated device data. ? ?Last Pain:  ?Vitals:  ? 02/24/22 0603  ?TempSrc:   ?PainSc: 0-No pain  ?   ? ?Patients Stated Pain Goal: 0 (02/24/22 0603) ? ?Complications: No notable events documented. ?

## 2022-02-24 NOTE — Brief Op Note (Signed)
02/24/2022 ? ?10:07 AM ? ?PATIENT:  Heather Lindsey  76 y.o. female ? ?PRE-OPERATIVE DIAGNOSIS:  Hiatal Hernia ? ?POST-OPERATIVE DIAGNOSIS:  Hiatal Hernia ? ?PROCEDURE: ESOPHAGOGASTRODUODENOSCOPY (EGD), XI ROBOTIC ASSISTED HIATAL HERNIA REPAIR using Myriad Mesh Pledgets and Fundoplication  ? ?SURGEON:  Surgeon(s) and Role: ?   Lightfoot, Lucile Crater, MD - Primary ? ?PHYSICIAN ASSISTANT: Lars Pinks PA-C ? ?ANESTHESIA:   general ? ?EBL:  20 mL  ? ?BLOOD ADMINISTERED:none ? ?DRAINS: none  ? ?LOCAL MEDICATIONS USED:  OTHER Exparel ? ?COUNTS CORRECT:  YES ? ?DICTATION: .Dragon Dictation ? ?PLAN OF CARE: Admit to inpatient  ? ?PATIENT DISPOSITION:  PACU - hemodynamically stable. ?  ?Delay start of Pharmacological VTE agent (>24hrs) due to surgical blood loss or risk of bleeding: yes ? ?

## 2022-02-24 NOTE — Hospital Course (Addendum)
HPI: ?This is a 76 y.o. female presents for surgical evaluation of gastroesophageal reflux.  She endorses a long history of reflux which has worsened over the last several years.  She has been placed on Protonix twice daily but continue use to have midepigastric burning sensation.  She also admits to coughing spells most mornings due to excess secretions.  She denies any dysphagia or odynophagia.  She has been having some mild shortness of breath but thinks that this is related mostly to age. ? ?With history of gastroesophageal reflux and grade C esophagitis on most recent endoscopy, Dr. Kipp Brood reviewed her manometry as well as her cross-sectional imaging.  In regards to her manometry, her GE junction shows normal pressures over there is evidence of a hiatal hernia.  She also has normal peristalsis of her esophagus.  Review of her CT scan she does have a small sliding hernia, and there is concern for an interstitial pattern in her lungs, which may be related to chronic reflux and aspiration.  Dr. Kipp Brood and the patient discussed the risks and benefits for a robotic assisted hiatal hernia repair with Nissen fundoplication and she is agreeable to proceed. ? ?Hospital Course: ?Patient underwent an esophagoscopy, robotic assisted laparoscopy, paraesophageal hernia repair with buttressed 5 layer Myriad mesh, and Nissen fundoplication. Patient was extubated in the OR and transferred from the OR to PACU in stable condition. Follow up chest x ray showed no pneumothorax, left base consolidation (atelectasis). Patient remained NPO and esophagram done 03/17 showed a narrowing at the GE junction with mild hold up of contrast (likely from fundoplication) but no leak. Marland Kitchen She was put on a Dysphagia I diet. She tolerated the diet without nausea or vomiting. Per patient, she had no difficulty swallowing. Her wounds are clean, dry, and healing without signs of infection. She is ambulating on room air. She is felt surgically  stable for discharge today and patient would like to go home. We reviewed diet and activity, which is included with her discharge instructions. ?

## 2022-02-25 ENCOUNTER — Inpatient Hospital Stay (HOSPITAL_COMMUNITY): Payer: Medicare Other

## 2022-02-25 ENCOUNTER — Encounter (HOSPITAL_COMMUNITY): Payer: Self-pay | Admitting: Thoracic Surgery (Cardiothoracic Vascular Surgery)

## 2022-02-25 DIAGNOSIS — K449 Diaphragmatic hernia without obstruction or gangrene: Secondary | ICD-10-CM | POA: Diagnosis not present

## 2022-02-25 LAB — BASIC METABOLIC PANEL
Anion gap: 10 (ref 5–15)
BUN: 8 mg/dL (ref 8–23)
CO2: 25 mmol/L (ref 22–32)
Calcium: 8.3 mg/dL — ABNORMAL LOW (ref 8.9–10.3)
Chloride: 100 mmol/L (ref 98–111)
Creatinine, Ser: 0.87 mg/dL (ref 0.44–1.00)
GFR, Estimated: >60 mL/min (ref >60)
Glucose, Bld: 114 mg/dL — ABNORMAL HIGH (ref 70–99)
Potassium: 4 mmol/L (ref 3.5–5.1)
Sodium: 135 mmol/L (ref 135–145)

## 2022-02-25 LAB — GLUCOSE, CAPILLARY: Glucose-Capillary: 85 mg/dL (ref 70–99)

## 2022-02-25 LAB — CBC
HCT: 33.4 % — ABNORMAL LOW (ref 36.0–46.0)
Hemoglobin: 11 g/dL — ABNORMAL LOW (ref 12.0–15.0)
MCH: 29.4 pg (ref 26.0–34.0)
MCHC: 32.9 g/dL (ref 30.0–36.0)
MCV: 89.3 fL (ref 80.0–100.0)
Platelets: 218 10*3/uL (ref 150–400)
RBC: 3.74 MIL/uL — ABNORMAL LOW (ref 3.87–5.11)
RDW: 12.6 % (ref 11.5–15.5)
WBC: 11.2 10*3/uL — ABNORMAL HIGH (ref 4.0–10.5)
nRBC: 0 % (ref 0.0–0.2)

## 2022-02-25 MED ORDER — ACETAMINOPHEN 325 MG PO TABS: 650.0000 mg | ORAL_TABLET | Freq: Four times a day (QID) | ORAL | Status: AC | PRN

## 2022-02-25 MED ORDER — ACETAMINOPHEN 325 MG PO TABS: 650.0000 mg | ORAL_TABLET | Freq: Four times a day (QID) | ORAL | Status: DC | PRN

## 2022-02-25 MED ORDER — IOHEXOL 300 MG/ML  SOLN
100.0000 mL | Freq: Once | INTRAMUSCULAR | Status: AC | PRN
  Administered 2022-02-25: 80 mL via ORAL

## 2022-02-25 MED ORDER — TRAMADOL HCL 50 MG PO TABS
50.0000 mg | ORAL_TABLET | Freq: Four times a day (QID) | ORAL | Status: DC | PRN
Start: 2022-02-25 — End: 2022-02-25
  Administered 2022-02-25: 50 mg via ORAL
  Filled 2022-02-25: qty 1

## 2022-02-25 MED ORDER — TRAMADOL HCL 50 MG PO TABS: 50.0000 mg | ORAL_TABLET | Freq: Four times a day (QID) | ORAL | 0 refills | Status: DC | PRN

## 2022-02-25 MED ORDER — ONDANSETRON 4 MG PO TBDP: 4.0000 mg | ORAL_TABLET | Freq: Three times a day (TID) | ORAL | 0 refills | Status: DC | PRN

## 2022-02-25 NOTE — Progress Notes (Signed)
? ?   ?  [address] ?      [place] [address] ?            [phone]   ? ?  ? ?1 Day Post-Op Procedure(s) (LRB): ?XI ROBOTIC ASSISTED HIATAL HERNIA REPAIR with Fundoplication using Myriad Matrix (N/A) ?ESOPHAGOGASTRODUODENOSCOPY (EGD) (N/A) ? ?Subjective: ?Patient using tablet this am. She states her pain is not bad;she has more neck pain because of the way she was lying in the bed. She denies nausea or vomiting. ? ?Objective: ?Vital signs in last 24 hours: ?Temp:  [97.8 ?F (36.6 ?C)-99.2 ?F (37.3 ?C)] 98.9 ?F (37.2 ?C) (03/17 0343) ?Pulse Rate:  [88-100] 100 (03/17 0343) ?Cardiac Rhythm: Normal sinus rhythm (03/16 1915) ?Resp:  [12-16] 14 (03/17 0343) ?BP: (127-148)/(60-93) 128/60 (03/17 0343) ?SpO2:  [90 %-95 %] 90 % (03/17 0343) ? ? ?  ? ?Intake/Output from previous day: ?03/16 0701 - 03/17 0700 ?In: 1920 [I.V.:1600; IV Piggyback:300] ?Out: 375 [Urine:355; Blood:20] ? ? ?Physical Exam: ? ?Cardiovascular: RRR ?Pulmonary: Clear to auscultation bilaterally ?Abdomen: Soft, non tender, bowel sounds present. ?Extremities: No lower extremity edema. ?Wounds: Clean and dry.  No erythema or signs of infection. ? ? ?Lab Results: ?CBC: ?Recent Labs  ?  02/22/22 ?9795 02/25/22 ?0101  ?WBC 8.2 11.2*  ?HGB 12.8 11.0*  ?HCT 39.3 33.4*  ?PLT 264 218  ? ?BMET:  ?Recent Labs  ?  02/22/22 ?3692 02/25/22 ?0101  ?NA 135 135  ?K 4.2 4.0  ?CL 104 100  ?CO2 21* 25  ?GLUCOSE 105* 114*  ?BUN 10 8  ?CREATININE 0.78 0.87  ?CALCIUM 9.4 8.3*  ?  ?PT/INR:  ?Recent Labs  ?  02/22/22 ?0919  ?LABPROT 13.3  ?INR 1.0  ? ?ABG:  ?INR: ?Will add last result for INR, ABG once components are confirmed ?Will add last 4 CBG results once components are confirmed ? ?Assessment/Plan: ? ?1. CV - SR ?2.  Pulmonary - On room air. Encourage incentive spirometer ?3. CBGs 123/120/85. No history of diabetes. Will stop. ?4. Anemia-H and H decreased to 11 and 33.4 ?5. GI-NPO. Await esophagram's result. If shows no leak, will start Dysphagia I  diet ? ? ?Sharalyn Ink ZimmermanPA-C ?02/25/2022,6:57 AM ?[phone]  ?

## 2022-02-25 NOTE — Care Management Obs Status (Signed)
MEDICARE OBSERVATION STATUS NOTIFICATION ? ? ?Patient Details  ?Name: Heather Lindsey ?MRN: [id number] ?Date of Birth: March 05, 1946 ? ? ?Medicare Observation Status Notification Given:  Yes ? ? ? ?Angelita Ingles, RN ?02/25/2022, 2:19 PM ?

## 2022-02-25 NOTE — Care Management CC44 (Signed)
Condition Code 44 Documentation Completed ? ?Patient Details  ?Name: Heather Lindsey ?MRN: [id number] ?Date of Birth: January 31, 1946 ? ? ?Condition Code 44 given:  Yes ?Patient signature on Condition Code 44 notice:  Yes ?Documentation of 2 MD's agreement:  Yes ?Code 44 added to claim:  Yes ? ? ? ?Angelita Ingles, RN ?02/25/2022, 2:19 PM ? ?

## 2022-03-02 ENCOUNTER — Other Ambulatory Visit: Payer: Self-pay | Admitting: *Deleted

## 2022-03-02 DIAGNOSIS — Z79899 Other long term (current) drug therapy: Secondary | ICD-10-CM

## 2022-03-02 DIAGNOSIS — R0989 Other specified symptoms and signs involving the circulatory and respiratory systems: Secondary | ICD-10-CM

## 2022-03-02 NOTE — Progress Notes (Signed)
? ?Office Visit Note ? ?Patient: Heather Lindsey             ?Date of Birth: 11-Jun-1946           ?MRN: [id number]             ?PCP: Prince Solian, MD ?Referring: Prince Solian, MD ?Visit Date: 03/16/2022 ?Occupation: '@GUAROCC' @ ? ?Subjective:  ?Discuss recent lab work  ? ?History of Present Illness: Heather Lindsey is a 76 y.o. female with history of morphea and osteoporosis.  Patient presents today to discuss recent high-resolution chest CT results from 01/27/2022 and lab results from 03/02/2022.  She denies any new or worsening pulmonary symptoms.  She has not noticed any increase skin tightness or thickening.  She has not had any symptoms of Raynaud's recently.  She discontinued amlodipine in the fall and has not had any recurrence of Raynaud's phenomenon since then.  She denies any digital ulcerations or signs of gangrene.  She continues to experience intermittent pain in both hands due to underlying osteoarthritis.  She denies any joint swelling at this time.  She is also having some discomfort in the right hip due to trochanter bursitis.  She was previously evaluated by Dr. Berenice Primas who recommended performing stretching exercises on a daily basis.  She has been taking baclofen and Tylenol for symptomatic relief.  She states her symptoms are worse when lying on her right side at night as well as when climbing steps.  She denies any groin pain at this time.  She denies any other new or worsening symptoms. ?Patient underwent hiatal hernia repair on 02/24/2022 performed by Dr. Kipp Brood.  She has noticed improvement in her symptoms since recovering from surgery. She has upcoming appointment scheduled on 04/01/2022 for postop follow-up at which time she hopes that she will be cleared to resume water therapy and stretching exercises daily. ? ? ? ?Activities of Daily Living:  ?Patient reports morning stiffness for all day. ?Patient Reports nocturnal pain.  ?Difficulty dressing/grooming: Denies ?Difficulty climbing stairs:  Reports ?Difficulty getting out of chair: Denies ?Difficulty using hands for taps, buttons, cutlery, and/or writing: Reports ? ?Review of Systems  ?Constitutional:  Positive for fatigue.  ?HENT:  Positive for mouth dryness and nose dryness. Negative for mouth sores.   ?Eyes:  Positive for dryness. Negative for pain and itching.  ?Respiratory:  Negative for shortness of breath and difficulty breathing.   ?Cardiovascular:  Negative for chest pain and palpitations.  ?Gastrointestinal:  Negative for blood in stool, constipation and diarrhea.  ?Endocrine: Negative for increased urination.  ?Genitourinary:  Negative for difficulty urinating.  ?Musculoskeletal:  Positive for joint pain, joint pain, myalgias, morning stiffness, muscle tenderness and myalgias. Negative for joint swelling.  ?Skin:  Negative for color change, rash and redness.  ?Allergic/Immunologic: Negative for susceptible to infections.  ?Neurological:  Negative for dizziness, numbness, headaches, memory loss and weakness.  ?Hematological:  Positive for bruising/bleeding tendency.  ?Psychiatric/Behavioral:  Negative for confusion.   ? ?PMFS History:  ?Patient Active Problem List  ? Diagnosis Date Noted  ? Hiatal hernia 02/24/2022  ? Heartburn   ? Cough 01/06/2019  ? Anxiety 01/05/2019  ? Left displaced femoral neck fracture ([place]) 01/05/2019  ? Hyponatremia 01/05/2019  ? Raynaud's phenomenon without gangrene 10/25/2016  ? Trochanteric bursitis of both hips 10/25/2016  ? Gastroesophageal reflux disease 10/25/2016  ? Former smoker, 10/25/2016  ? 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  HYPERCHOLESTEROLEMIA 11/18/2008  ? Depression 11/18/2008  ? IRRITABLE BOWEL SYNDROME 11/18/2008  ? Morphea 11/18/2008  ? Primary osteoarthritis of both hands 11/18/2008  ? Age related osteoporosis 11/18/2008  ? MIGRAINES, HX OF 11/18/2008  ?  ?Past Medical  History:  ?Diagnosis Date  ? Anxiety   ? 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 Brain bleed ([place])   ? Circumscribed scleroderma   ? Depression   ? Diverticulosis   ? GERD (gastroesophageal reflux disease)   ? Hyperlipidemia   ? IBS (irritable bowel syndrome)   ? Memory loss   ? Migraines   ? Osteoarthritis   ? Osteoporosis   ? Stroke [facility]) 2015  ?  ?Family History  ?Problem Relation Age of Onset  ? Heart disease Father   ? Liver cancer Paternal Aunt   ? Prostate cancer Paternal Grandfather   ? Colon cancer Neg Hx   ? Stomach cancer Neg Hx   ? Pancreatic cancer Neg Hx   ? Esophageal cancer Neg Hx   ? Rectal cancer Neg Hx   ? ?Past Surgical History:  ?Procedure Laterality Date  ? ANKLE FRACTURE SURGERY Left   ? ESOPHAGEAL MANOMETRY N/A 12/29/2021  ? Procedure: ESOPHAGEAL MANOMETRY (EM);  Surgeon: Doran Stabler, MD;  Location: [facility];  Service: Gastroenterology;  Laterality: N/A;  pt will stay on her PPI  ? ESOPHAGOGASTRODUODENOSCOPY N/A 02/24/2022  ? Procedure: ESOPHAGOGASTRODUODENOSCOPY (EGD);  Surgeon: Lajuana Matte, MD;  Location: [place];  Service: Thoracic;  Laterality: N/A;  ? TOTAL HIP ARTHROPLASTY Left 01/06/2019  ? Procedure: TOTAL HIP ARTHROPLASTY ANTERIOR APPROACH;  Surgeon: Dorna Leitz, MD;  Location: [facility];  Service: Orthopedics;  Laterality: Left;  ? TUBAL LIGATION    ? XI ROBOTIC ASSISTED HIATAL HERNIA REPAIR N/A 02/24/2022  ? Procedure: XI ROBOTIC ASSISTED HIATAL HERNIA REPAIR with Fundoplication using Myriad Matrix;  Surgeon: Lajuana Matte, MD;  Location: [place];  Service: Thoracic;  Laterality: N/A;  ? ?Social History  ? ?Social History Narrative  ? Daily caffeine, 2 glasses daily,   Right handed, Married, 2 sons. Retired. College.  ? ?Immunization History  ?Administered Date(s) Administered  ? PFIZER(Purple Top)SARS-COV-2 Vaccination 12/28/2019, 01/18/2020, 08/07/2020  ? Zoster Recombinat (Shingrix) 03/01/2018, 06/17/2018  ?  ? ?Objective: ?Vital  Signs: BP 123/82 (BP Location: Left Arm, Patient Position: Sitting, Cuff Size: Normal)   Pulse 78   Ht '5\' 4"'  (1.626 m)   Wt 158 lb 3.2 oz (71.8 kg)   BMI 27.15 kg/m?   ? ?Physical Exam ?Vitals and nursing note reviewed.  ?Constitutional:   ?   Appearance: She is well-developed.  ?HENT:  ?   Head: Normocephalic and atraumatic.  ?Eyes:  ?   Conjunctiva/sclera: Conjunctivae normal.  ?Cardiovascular:  ?   Rate and Rhythm: Normal rate and regular rhythm.  ?   Heart sounds: Normal heart sounds.  ?Pulmonary:  ?   Effort: Pulmonary effort is normal.  ?   Breath sounds: Normal breath sounds.  ?Abdominal:  ?   General: Bowel sounds are normal.  ?   Palpations: Abdomen is soft.  ?Musculoskeletal:  ?   Cervical back: Normal range of motion.  ?Skin: ?   General: Skin is warm and dry.  ?   Capillary Refill: Capillary refill takes less than 2 seconds.  ?   Comments: Extensive morphea lesions. ?No signs of sclerodactyly.  ?No digital ulcers or signs of gangrene  ?Neurological:  ?   Mental Status:  She is alert and oriented to person, place, and time.  ?Psychiatric:     ?   Behavior: Behavior normal.  ?  ? ?Musculoskeletal Exam: C-spine, thoracic spine, and lumbar spine good ROM.  No midline spinal tenderness or SI joint tenderness.  Shoulder joints, elbow joints, wrist joints, MCPs, PIPs, and DIPs good ROM with no synovitis.  Complete fist formation bilaterally.  PIP and DIP thickening consistent with osteoarthritis.  Subluxation of the right 1st MCP joint.  CMC joint thickening bilaterally.  Hip joints have good ROM with no groin pain.  Tenderness to palpation over both trochanteric bursa, right great than left.  Knee joints have good ROM with no warmth or effusion.  Ankle joints have good ROM with no tenderness or joint swelling.  ? ?CDAI Exam: ?CDAI Score: -- ?Patient Global: --; Provider Global: -- ?Swollen: --; Tender: -- ?Joint Exam 03/16/2022  ? ?No joint exam has been documented for this visit  ? ?There is currently no  information documented on the homunculus. Go to the Rheumatology activity and complete the homunculus joint exam. ? ?Investigation: ?No additional findings. ? ?Imaging: ?DG Chest 2 View ? ?Result Date: 02/23/2022 ?CLINI

## 2022-03-04 ENCOUNTER — Other Ambulatory Visit: Payer: Self-pay

## 2022-03-04 ENCOUNTER — Ambulatory Visit (INDEPENDENT_AMBULATORY_CARE_PROVIDER_SITE_OTHER): Payer: Self-pay | Admitting: Thoracic Surgery (Cardiothoracic Vascular Surgery)

## 2022-03-04 DIAGNOSIS — K449 Diaphragmatic hernia without obstruction or gangrene: Secondary | ICD-10-CM

## 2022-03-04 NOTE — Progress Notes (Signed)
?   ?  [address] ?      [place] [address] ?            [phone]      ? ?Patient: Home ?Provider: Office ?Consent for Telemedicine visit obtained. ? ?Today?s visit was completed via a real-time telehealth (see specific modality noted below). The patient/authorized person provided oral consent at the time of the visit to engage in a telemedicine encounter with the present provider at [facility]. The patient/authorized person was informed of the potential benefits, limitations, and risks of telemedicine. The patient/authorized person expressed understanding that the laws that protect confidentiality also apply to telemedicine. The patient/authorized person acknowledged understanding that telemedicine does not provide emergency services and that he or she would need to call 911 or proceed to the nearest hospital for help if such a need arose. ? ? Total time spent in the clinical discussion 10 minutes. ? Telehealth Modality: Phone visit (audio only) ? ?I had a telephone visit with [name].  She is 1 week out from a hiatal hernia repair with Nissen fundoplication.  Overall she is doing well.  She is tolerating her diet without any difficulty.  She has been taking her Protonix.  She denies any digestive or swallowing issues.  I will follow-up with her in 1 month with a chest x-ray. ? ?

## 2022-03-08 LAB — PAN-ANCA
ANCA SCREEN: NEGATIVE
Myeloperoxidase Abs: <1 AI (ref <1.0)
Serine Protease 3: <1 AI (ref <1.0)

## 2022-03-08 LAB — ANA: Anti Nuclear Antibody (ANA): NEGATIVE

## 2022-03-08 LAB — RNP ANTIBODY: Ribonucleic Protein(ENA) Antibody, IgG: <1 AI

## 2022-03-08 LAB — CK: Total CK: 71 U/L (ref 29–143)

## 2022-03-08 LAB — ANTI-SMITH ANTIBODY: ENA SM Ab Ser-aCnc: <1 AI

## 2022-03-08 LAB — C3 AND C4
C3 Complement: 198 mg/dL — ABNORMAL HIGH (ref 83–193)
C4 Complement: 41 mg/dL (ref 15–57)

## 2022-03-08 LAB — SJOGRENS SYNDROME-B EXTRACTABLE NUCLEAR ANTIBODY: SSB (La) (ENA) Antibody, IgG: <1 AI

## 2022-03-08 LAB — RHEUMATOID FACTOR: Rheumatoid fact SerPl-aCnc: <14 IU/mL (ref <14)

## 2022-03-08 LAB — CYCLIC CITRUL PEPTIDE ANTIBODY, IGG: Cyclic Citrullin Peptide Ab: <16 UNITS

## 2022-03-08 LAB — ANTI-DNA ANTIBODY, DOUBLE-STRANDED: ds DNA Ab: <1 IU/mL

## 2022-03-08 LAB — ANTI-SCLERODERMA ANTIBODY: Scleroderma (Scl-70) (ENA) Antibody, IgG: <1 AI

## 2022-03-08 LAB — SEDIMENTATION RATE: Sed Rate: 34 mm/h — ABNORMAL HIGH (ref 0–30)

## 2022-03-08 LAB — SJOGRENS SYNDROME-A EXTRACTABLE NUCLEAR ANTIBODY: SSA (Ro) (ENA) Antibody, IgG: <1 AI

## 2022-03-08 LAB — JO-1 ANTIBODY-IGG: Jo-1 Autoabs: <1 AI

## 2022-03-08 LAB — THIOPURINE METHYLTRANSFERASE (TPMT), RBC: Thiopurine Methyltransferase, RBC: 13 nmol/hr/mL RBC

## 2022-03-08 NOTE — Progress Notes (Signed)
All autoimmune labs are negative.  We will discuss results at the follow-up visit.

## 2022-03-16 ENCOUNTER — Ambulatory Visit: Payer: Medicare Other | Admitting: Physician Assistant

## 2022-03-16 ENCOUNTER — Other Ambulatory Visit: Payer: Self-pay | Admitting: Physician Assistant

## 2022-03-16 ENCOUNTER — Encounter: Payer: Self-pay | Admitting: Physician Assistant

## 2022-03-16 VITALS — BP 123/82 | HR 78 | Ht 64.0 in | Wt 158.2 lb

## 2022-03-16 DIAGNOSIS — Z8659 Personal history of other mental and behavioral disorders: Secondary | ICD-10-CM

## 2022-03-16 DIAGNOSIS — Z79899 Other long term (current) drug therapy: Secondary | ICD-10-CM | POA: Diagnosis not present

## 2022-03-16 DIAGNOSIS — L940 Localized scleroderma [morphea]: Secondary | ICD-10-CM | POA: Diagnosis not present

## 2022-03-16 DIAGNOSIS — M19042 Primary osteoarthritis, left hand: Secondary | ICD-10-CM

## 2022-03-16 DIAGNOSIS — Z8639 Personal history of other endocrine, nutritional and metabolic disease: Secondary | ICD-10-CM

## 2022-03-16 DIAGNOSIS — M7062 Trochanteric bursitis, left hip: Secondary | ICD-10-CM

## 2022-03-16 DIAGNOSIS — G8929 Other chronic pain: Secondary | ICD-10-CM

## 2022-03-16 DIAGNOSIS — M19041 Primary osteoarthritis, right hand: Secondary | ICD-10-CM

## 2022-03-16 DIAGNOSIS — M79671 Pain in right foot: Secondary | ICD-10-CM

## 2022-03-16 DIAGNOSIS — M7061 Trochanteric bursitis, right hip: Secondary | ICD-10-CM

## 2022-03-16 DIAGNOSIS — R9389 Abnormal findings on diagnostic imaging of other specified body structures: Secondary | ICD-10-CM

## 2022-03-16 DIAGNOSIS — K449 Diaphragmatic hernia without obstruction or gangrene: Secondary | ICD-10-CM

## 2022-03-16 DIAGNOSIS — M25511 Pain in right shoulder: Secondary | ICD-10-CM

## 2022-03-16 DIAGNOSIS — Z96642 Presence of left artificial hip joint: Secondary | ICD-10-CM

## 2022-03-16 DIAGNOSIS — Z8719 Personal history of other diseases of the digestive system: Secondary | ICD-10-CM

## 2022-03-16 DIAGNOSIS — I7300 Raynaud's syndrome without gangrene: Secondary | ICD-10-CM

## 2022-03-16 DIAGNOSIS — M810 Age-related osteoporosis without current pathological fracture: Secondary | ICD-10-CM

## 2022-03-16 DIAGNOSIS — Z8669 Personal history of other diseases of the nervous system and sense organs: Secondary | ICD-10-CM

## 2022-03-17 ENCOUNTER — Ambulatory Visit: Payer: Medicare Other | Admitting: Internal Medicine

## 2022-03-17 ENCOUNTER — Telehealth: Payer: Self-pay | Admitting: Internal Medicine

## 2022-03-17 ENCOUNTER — Encounter: Payer: Self-pay | Admitting: Internal Medicine

## 2022-03-17 VITALS — BP 120/78 | HR 78 | Temp 98.5°F | Ht 64.0 in | Wt 157.6 lb

## 2022-03-17 DIAGNOSIS — Z8679 Personal history of other diseases of the circulatory system: Secondary | ICD-10-CM | POA: Diagnosis not present

## 2022-03-17 DIAGNOSIS — K449 Diaphragmatic hernia without obstruction or gangrene: Secondary | ICD-10-CM | POA: Diagnosis not present

## 2022-03-17 DIAGNOSIS — Z7729 Contact with and (suspected ) exposure to other hazardous substances: Secondary | ICD-10-CM | POA: Diagnosis not present

## 2022-03-17 DIAGNOSIS — J849 Interstitial pulmonary disease, unspecified: Secondary | ICD-10-CM | POA: Diagnosis not present

## 2022-03-17 NOTE — Telephone Encounter (Signed)
Next Visit: 06/30/2022 ? ?Last Visit: 03/16/2022 ? ?Last Fill: 12/07/2021 ? ?Current Dose per office note on 03/16/2022: not discussed ? ?Okay to refill Baclofen?   ?

## 2022-03-17 NOTE — Patient Instructions (Addendum)
ICD-10-CM   ?1. ILD (interstitial lung disease) ([place])  J84.9   ?  ?2. Hiatal hernia  K44.9   ?  ?3. History of Raynaud's syndrome  Z86.79   ?  ? 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 ?Overall good prognosis expected ? ?Currently though purists might recommend biopsy I recommend we wait and watch ? ?Plan ? - do HRCT supine and prone in 5 months ?- do full PFT in 5 months ?- do walk test 03/17/2022 ?-get rid of bird and feather pillow and sofa (Addendum after she left) ? ?Followup ?- 5 months but after PFT and CT ?

## 2022-03-17 NOTE — Progress Notes (Signed)
? ? ? ? ?OV 03/17/2022 ? ?Subjective:  ?Patient ID: Heather Lindsey, female , DOB: [birth year] , age 76 y.o. , MRN: [id number] , ADDRESS: [address] ?[place] [id number] ?PCP Prince Solian, MD ?Patient Care Team: ?Prince Solian, MD as PCP - General (Internal Medicine) ? ?This Provider for this visit: Treatment Team:  ?Attending Provider: Brand Males, MD ? ? ? ?03/17/2022 -   ?Chief Complaint  ?Patient presents with  ? Consult  ?  Pt is being seen due to recent CT.  Pt has no current complaints.  ? ? ? ?HPI ?Heather Lindsey 76 y.o. -referred by primary care physician Dr. Elsworth Soho for evaluation of possible interstitial lung disease.  Also sees Dr. Estanislado Pandy in rheumatology for morphea.  She tells me that she has had acid reflux for the last several years.  She is also had previous Raynaud's but review the records indicate detailed connective tissue disease work-up is negative.  As part of this she was diagnosed have hiatal hernia and then she had a CT scan that showed significant hiatal hernia then on 02/24/2022 she underwent hiatal hernia surgery by Dr. Doy Hutching.  Specifically surgery was status post esophagoscopy and hiatal hernia repair with [facility] admission nisin fundoplication.  Before the surgery she was having cough and acid reflux.  Both the symptoms have resolved.  She has never had dyspnea on exertion except when climbing heavy set of stairs this thing is still persistent.  And it is only mild.  Overall she is doing well.  But she is here for the ILD.  She does do water aerobics 3 times a week before the surgery and she never had any shortness of breath for that.  Specifically ILD question as below ? ? ?[facility] Integrated Comprehensive ILD Questionnaire ? ?Symptoms:  ? ? ?SYMPTOM SCALE - ILD 03/17/2022  ?Current weight   ?O2 use ra  ?Shortness of Breath 0 -> 5 scale with 5 being worst (score 6 If unable to do)  ?At rest 0  ?Simple tasks - showers, clothes change, eating, shaving 0  ?Household  (dishes, doing bed, laundry) 0  ?Shopping 0  ?Walking level at own pace 0  ?Walking up Stairs 1.5  ?Total (30-36) Dyspnea Score 1.5  ?How bad is your cough? 2  ?How bad is your fatigue 3  ?How bad is nausea 0  ?How bad is vomiting? ? 0  ?How bad is diarrhea? 0  ?How bad is anxiety? 3  ?How bad is depression 3  ?Any chronic pain - if so where and how bad 3  ? ? ? ? ?Past Medical History :  ?-Morphea sees rheumatology ?Acid reflux with hiatal hernia status post Nissen fundoplication mid March 7619 by Dr. 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 ? ?ROS:  ?Positive for fatigue and arthralgia.  Positive for dry eyes.  Positive for Raynaud's.  6 connective tissue disease autoimmune antibody panel negative.  Positive for acid reflux but now controlled after surgery ? ?FAMILY HISTORY of LUNG DISEASE:  ?Denies family history of lung disease* ? ?PERSONAL EXPOSURE HISTORY:  ?-Started smoking 1966.  Quit smoking 1971.  10 to 15 cigarettes a day.  No marijuana use no cocaine use.  No intravenous drug use ? ?HOME  EXPOSURE and HOBBY DETAILS :  ?-[place].  The home itself is 75 years old she uses a steam iron  but there is no mildew in it.  She has had a cocktail for the last 28 years..  She does have feather pillow and sulfa ? ?OCCUPATIONAL HISTORY (122 questions) : ?Detail organic and inorganic antigen exposure history is negative.  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 ?PULMONARY TOXICITY HISTORY (27 items):  ?She is taking nitrofurantoin several x30 years ago.  She has been on methotrexate for morphea 15 or 20 years ago and tries to avoid it.  She was on some prednisone 5 years ago. ? ?INVESTIGATIONS: ? ? ? ?Simple office walk 185 feet x  3 laps goal with forehead probe 03/17/2022 ?   ?O2 used ra   ?Number laps completed 3   ?Comments about pace avg    ?Resting Pulse Ox/HR 100% and 79/min   ?Final Pulse Ox/HR 100% and 1051/min   ?Desaturated </= 88% no   ?Desaturated <= 3% points no   ?Got Tachycardic >/= 90/min yes   ?Symptoms at end of test No complaitns   ?Miscellaneous comments x   ? ? ?She has had a CT chest with contrast: I personally visualized this and showed it to her.  There is some ILD -there is a craniocaudal gradient.  It is very mild.  Is either postinflammatory or indeterminate for UIP pattern.  Any with a contrast CT was done in February 2023 before the hiatal hernia surgery ? ? ?PFT ? ?   ? View : No data to display.  ?  ?  ?  ? ? ? ? ? 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 ? ? reports that she quit smoking about 50 years ago. Her smoking use included cigarettes. She has a 2.00 pack-year smoking history. She has never been exposed to tobacco smoke. She has never used smokeless tobacco. ? ?Past Surgical History:  ?Procedure Laterality Date  ? ANKLE FRACTURE SURGERY Left   ? ESOPHAGEAL MANOMETRY N/A 12/29/2021  ? Procedure: ESOPHAGEAL MANOMETRY (EM);  Surgeon: Doran Stabler, MD;  [facility]: [facility];  Service: Gastroenterology;  Laterality: N/A;  pt will stay on her PPI  ? ESOPHAGOGASTRODUODENOSCOPY N/A 02/24/2022  ? Procedure: ESOPHAGOGASTRODUODENOSCOPY (EGD);  Surgeon: Lajuana Matte, MD;  Location: [place];  Service: Thoracic;  Laterality: N/A;  ? TOTAL HIP ARTHROPLASTY Left 01/06/2019  ? Procedure: TOTAL HIP ARTHROPLASTY ANTERIOR APPROACH;  Surgeon: Dorna Leitz, MD;  Location: [facility];  Service: Orthopedics;  Laterality: Left;  ? TUBAL LIGATION    ? XI ROBOTIC ASSISTED HIATAL HERNIA REPAIR N/A 02/24/2022  ? Procedure: XI ROBOTIC  ASSISTED HIATAL HERNIA REPAIR with Fundoplication using Myriad Matrix;  Surgeon: Lajuana Matte, MD;  Location: [facility];  Service: Thoracic;  Laterality: N/A;  ? ? ?Allergies  ?Allergen Reactions  ? Actonel [Risedronate]   ?  Severe chest pain  ? Alendronate Other (See Comments)  ?  Severe chest pain  ? Boniva [Ibandronic Acid]   ?  Chest pain  ? Lisinopril   ?  vertigo  ? ? ?Immunization History  ?Administered Date(s) Administered  ? Influenza Split 11/02/2009, 09/02/2010, 11/15/2011, 09/25/2012, 11/25/2013, 09/30/2014  ? Influenza, High Dose Seasonal PF 08/16/2017  ? Influenza, Quadrivalent, Recombinant, Inj, Pf 09/08/2018, 09/12/2019, 10/07/2020, 10/06/2021  ? Influenza,inj,Quad PF,6+ Mos 08/20/2015  ?  PFIZER(Purple Top)SARS-COV-2 Vaccination 12/28/2019, 01/18/2020, 08/07/2020  ? Pneumococcal Conjugate-13 11/25/2013  ? Tdap 10/15/2007  ? Zoster Recombinat (Shingrix) 03/01/2018, 06/17/2018  ? Zoster, Live 11/02/2009, 04/16/2013  ? ? ?Family History  ?Problem Relation Age of Onset  ? Heart disease Father   ? Liver cancer Paternal Aunt   ? Prostate cancer Paternal Grandfather   ? Colon cancer Neg Hx   ? Stomach cancer Neg Hx   ? Pancreatic cancer Neg Hx   ? Esophageal cancer Neg Hx   ? Rectal cancer Neg Hx   ? ? ? ?Current Outpatient Medications:  ?  acetaminophen (TYLENOL) 325 MG tablet, Take 2 tablets (650 mg total) by mouth every 6 (six) hours as needed for mild pain., Disp: , Rfl:  ?  ALPRAZolam (XANAX) 0.5 MG tablet, Take 0.25 mg by mouth at bedtime as needed for anxiety or sleep., Disp: , Rfl:  ?  baclofen (LIORESAL) 10 MG tablet, TAKE 1 TABLET BY MOUTH AT 8 AM  AND 2 PM AS NEEDED, Disp: 60 tablet, Rfl: 0 ?  calcium carbonate (TUMS - DOSED IN MG ELEMENTAL CALCIUM) 500 MG chewable tablet, Chew 3-4 tablets by mouth daily as needed for indigestion or heartburn., Disp: , Rfl:  ?  carboxymethylcellulose (REFRESH PLUS) 0.5 % SOLN, Place 1 drop into both eyes 3 (three) times daily as needed (dry eyes)., Disp: ,  Rfl:  ?  Cholecalciferol 50 MCG (2000 UT) CAPS, Take 2,000 Units by mouth daily., Disp: , Rfl:  ?  denosumab (PROLIA) 60 MG/ML SOSY injection, Inject 60 mg into the skin every 6 (six) months., Disp: , Rfl:  ?

## 2022-03-17 NOTE — Telephone Encounter (Signed)
Heather Lindsey ?[name] -let her know that after she left I was able to review the ILD question in more detail.  I did notice that she has a pet bird cockatiel.  Please ask if this is still true?  Also there is a feather pillows and blanket/sofa? ? ?All this can cause a type of interstitial lung disease called hypersensitive pneumonitis.  It it is in the best interest to get rid of these.  If she cannot get out of the bed at this point definitely work towards making changes to a feather pillow or blanket and sofa before the next visit ?

## 2022-03-21 NOTE — Telephone Encounter (Signed)
Attempted to call pt but unable to reach. Left message for her to return call. 

## 2022-03-21 NOTE — Telephone Encounter (Signed)
Called and spoke with pt letting her know the info per MR and she verbalized understanding. Pt said that she used to have a cockatiel but he died years ago. Pt said that she does have a sofa that has feathers in it. Stated she would take the info from MR into consideration. Nothing further needed. ?

## 2022-03-30 ENCOUNTER — Other Ambulatory Visit: Payer: Self-pay | Admitting: Thoracic Surgery (Cardiothoracic Vascular Surgery)

## 2022-03-30 DIAGNOSIS — K449 Diaphragmatic hernia without obstruction or gangrene: Secondary | ICD-10-CM

## 2022-04-01 ENCOUNTER — Ambulatory Visit (INDEPENDENT_AMBULATORY_CARE_PROVIDER_SITE_OTHER): Payer: Self-pay | Admitting: Thoracic Surgery (Cardiothoracic Vascular Surgery)

## 2022-04-01 ENCOUNTER — Ambulatory Visit
Admission: RE | Admit: 2022-04-01 | Discharge: 2022-04-01 | Disposition: A | Discharge status: Home / Self Care | Payer: Medicare Other | Source: Ambulatory Visit | Attending: Thoracic Surgery (Cardiothoracic Vascular Surgery) | Admitting: Thoracic Surgery (Cardiothoracic Vascular Surgery)

## 2022-04-01 VITALS — BP 143/87 | HR 71 | Resp 20 | Ht 64.0 in | Wt 155.0 lb

## 2022-04-01 DIAGNOSIS — K449 Diaphragmatic hernia without obstruction or gangrene: Secondary | ICD-10-CM

## 2022-04-01 NOTE — Progress Notes (Signed)
? ?   ?  [address] ?      Heather Lindsey [address] ?            [phone]       ? ?Heather Lindsey ?[facility] Record #[id number] ?Date of Birth: [birth year] ? ?Referring: Doran Stabler, MD ?Primary Care: Prince Solian, MD ?Primary Cardiologist:None ? ?Reason for visit:   follow-up ? ?History of Present Illness:     ?[name] in for 1 month follow-up appointment.  She states that she is doing much better.  She is not taking any reflux medication and denies any reflux symptoms.  She is tolerating a regular diet.  Her respiratory status is also improved. ? ?Physical Exam: ?BP (!) 143/87   Pulse 71   Resp 20   Ht '5\' 4"'$  (1.626 m)   Wt 155 lb (70.3 kg)   SpO2 95% Comment: RA  BMI 26.61 kg/m?  ? ?Alert NAD ?Abdomen, ND ?No peripheral edema ? ? ?Diagnostic Studies & Laboratory data: ?CXR: Clear ? ?  ? ?Assessment / Plan:   ?76 year old female status post hiatal hernia repair with fundoplication.  Overall she is doing quite well.  She is cleared from a surgical standpoint to resume all normal activities.  I will follow-up with her virtually in 3 months. ? ? ?Lucile Crater Haydn Hutsell ?04/01/2022 3:53 PM ? ? ? ? ? ? ?

## 2022-05-04 ENCOUNTER — Telehealth: Payer: Self-pay | Admitting: Gastroenterology

## 2022-05-04 NOTE — Telephone Encounter (Signed)
Patient called requested to speak with a nurse regarding her recall colonoscopy due and recently having a procedure for hiatal hernia.

## 2022-05-05 NOTE — Telephone Encounter (Signed)
Returned call to patient. She wanted to know if her recall colonoscopy had anything to do with her hiatal hernia. I informed pt that the two are not related. I informed the patient that this is her 5 year recall colonoscopy. Pt wanted to go ahead and schedule. Patient has been scheduled for an in person pre-visit appt on Thursday, 06/09/22 at 9 am. Pt advised to check in on the 2nd floor. Colonoscopy has been scheduled in the [facility] with Dr. Loletha Carrow on Thursday, 06/23/22 at 9:30 am. Pt is aware that she will need to arrive on the 4th floor by 8:30 am with a care partner. Pt verbalized understanding of all information and had no concerns at the end of the call.

## 2022-06-02 ENCOUNTER — Encounter: Payer: Self-pay | Admitting: Gastroenterology

## 2022-06-09 ENCOUNTER — Ambulatory Visit (AMBULATORY_SURGERY_CENTER): Payer: Self-pay | Admitting: *Deleted

## 2022-06-09 VITALS — Ht 63.0 in | Wt 154.0 lb

## 2022-06-09 DIAGNOSIS — Z86010 Personal history of colonic polyps: Secondary | ICD-10-CM

## 2022-06-09 MED ORDER — NA SULFATE-K SULFATE-MG SULF 17.5-3.13-1.6 GM/177ML PO SOLN: 1.0000 | Freq: Once | ORAL | 0 refills | Status: AC

## 2022-06-09 NOTE — Progress Notes (Signed)

## 2022-06-17 NOTE — Progress Notes (Signed)
Office Visit Note  Patient: Heather Lindsey             Date of Birth: [birth year]           MRN: [id number]             PCP: Prince Solian, MD Referring: Prince Solian, MD Visit Date: 06/30/2022 Occupation: '@GUAROCC' @  Subjective:  Other (Patient would like to consider restarting celebrex. Patient also reports burning sensation in left palm.)   History of Present Illness: Heather Lindsey is a 76 y.o. female with history of morphea, ILD, osteoarthritis and osteoporosis.  She states that she has been experiencing burning sensation in the palm of her left hand for the last 2 months.  She still has discomfort in her left shoulder.  She continues to have stiffness in her hands.  She has tenderness over the right trochanteric bursa which radiates to her right thigh intermittently.  She was evaluated by Dr. Chase Caller in April 2023.  At that time Dr. Chase Caller felt that her ILD symptoms are related to gastroesophageal reflux.  She will have a repeat high-resolution CT in the near future.  She has been getting Prolia injections through Dr. Dagmar Hait.  Activities of Daily Living:  Patient reports morning stiffness for all day. Patient Reports nocturnal pain.  Difficulty dressing/grooming: Denies Difficulty climbing stairs: Reports Difficulty getting out of chair: Denies Difficulty using hands for taps, buttons, cutlery, and/or writing: Reports  Review of Systems  Constitutional:  Positive for fatigue.  HENT:  Positive for mouth dryness. Negative for mouth sores.   Eyes:  Positive for dryness.  Respiratory:  Negative for shortness of breath.   Cardiovascular:  Negative for chest pain and palpitations.  Gastrointestinal:  Negative for blood in stool, constipation and diarrhea.  Endocrine: Negative for increased urination.  Genitourinary:  Negative for involuntary urination.  Musculoskeletal:  Positive for joint pain, joint pain, myalgias, morning stiffness, muscle tenderness and myalgias. Negative for  joint swelling and muscle weakness.  Skin:  Negative for color change, rash, hair loss and sensitivity to sunlight.  Allergic/Immunologic: Negative for susceptible to infections.  Neurological:  Negative for dizziness and headaches.  Hematological:  Negative for swollen glands.  Psychiatric/Behavioral:  Positive for depressed mood. Negative for sleep disturbance. The patient is not nervous/anxious.     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/02/2015   Bleeding in brain due to blood pressure disorder ([place]) 10/01/2014   HYPERCHOLESTEROLEMIA 11/18/2008   Depression 11/18/2008   IRRITABLE BOWEL SYNDROME 11/18/2008   Morphea 11/18/2008   Primary osteoarthritis of both hands 11/18/2008   Age related osteoporosis 11/18/2008   MIGRAINES, HX OF 11/18/2008    Past Medical History:  Diagnosis Date   Allergy    SEASONAL   Anxiety    Bleeding in brain due to blood pressure disorder ([place]) 10/01/2014   Brain bleed ([place])    Broken wrist    LEFT,2006   Cataract    BILATERAL NO SURGERY YET UPDATED 06/09/22   Circumscribed scleroderma    Depression    Diverticulosis    GERD (gastroesophageal reflux  disease)    Hyperlipidemia    IBS (irritable bowel syndrome)    Memory loss    Migraines    Osteoarthritis    Osteoporosis    Stroke ([place]) 2015    Family History  Problem Relation Age of Onset   Heart disease Father    Liver cancer Paternal Aunt    Prostate cancer Paternal Grandfather    Colon cancer Neg Hx    Stomach cancer Neg Hx    Pancreatic  cancer Neg Hx    Esophageal cancer Neg Hx    Rectal cancer Neg Hx    Colon polyps Neg Hx    Crohn's disease Neg Hx    Past Surgical History:  Procedure Laterality Date   ANKLE FRACTURE SURGERY Left    COLONOSCOPY     ESOPHAGEAL MANOMETRY N/A 12/29/2021   Procedure: ESOPHAGEAL MANOMETRY (EM);  Surgeon: Doran Stabler, MD;  [facility];  Service: Gastroenterology;  Laterality: N/A;  pt will stay on her PPI   ESOPHAGOGASTRODUODENOSCOPY N/A 02/24/2022   Procedure: ESOPHAGOGASTRODUODENOSCOPY (EGD);  Surgeon: Lajuana Matte, MD;  Location: [facility];  Service: Thoracic;  Laterality: N/A;   POLYPECTOMY     TOTAL HIP ARTHROPLASTY Left 01/06/2019   Procedure: TOTAL HIP ARTHROPLASTY ANTERIOR APPROACH;  Surgeon: Dorna Leitz, MD;  [facility];  Service: Orthopedics;  Laterality: Left;   TUBAL LIGATION     XI ROBOTIC ASSISTED HIATAL HERNIA REPAIR N/A 02/24/2022   Procedure: XI ROBOTIC ASSISTED HIATAL HERNIA REPAIR with Fundoplication using Myriad Matrix;  Surgeon: Lajuana Matte, MD;  [facility];  Service: Thoracic;  Laterality: N/A;   Social History   Social History Narrative   Daily caffeine, 2 glasses daily,   Right handed, Married, 2 sons. Retired. College.   Immunization History  Administered Date(s) Administered   Influenza Split 11/02/2009, 09/02/2010, 11/15/2011, 09/25/2012, 11/25/2013, 09/30/2014   Influenza, High Dose Seasonal PF 08/16/2017   Influenza, Quadrivalent, Recombinant, Inj, Pf 09/08/2018, 09/12/2019, 10/07/2020, 10/06/2021   Influenza,inj,Quad PF,6+ Mos 08/20/2015   PFIZER(Purple Top)SARS-COV-2 Vaccination 12/28/2019, 01/18/2020, 08/07/2020   Pneumococcal Conjugate-13 11/25/2013   Tdap 10/15/2007   Zoster Recombinat (Shingrix) 03/01/2018, 06/17/2018   Zoster, Live 11/02/2009, 04/16/2013     Objective: Vital Signs: BP 126/84 (BP Location: Right Arm, Patient Position: Sitting, Cuff Size: Normal)   Pulse 75   Ht '5\' 3"'  (1.6 m)   Wt 153  lb 6.4 oz (69.6 kg)   BMI 27.17 kg/m    Physical Exam Vitals and nursing note reviewed.  Constitutional:      Appearance: She is well-developed.  HENT:     Head: Normocephalic and atraumatic.  Eyes:     Conjunctiva/sclera: Conjunctivae normal.  Cardiovascular:     Rate and Rhythm: Normal rate and regular rhythm.     Heart sounds: Normal heart sounds.  Pulmonary:     Effort: Pulmonary effort is normal.     Breath sounds: Normal breath sounds.  Abdominal:     General: Bowel sounds are normal.     Palpations: Abdomen is soft.  Musculoskeletal:     Cervical back: Normal range of motion.  Lymphadenopathy:     Cervical: No cervical adenopathy.  Skin:    General: Skin is warm and dry.     Capillary Refill: Capillary refill takes less than 2 seconds.     Comments: She has extensive rash from morphea on her trunk and extremities.  Neurological:     Mental Status: She is alert and oriented  to person, place, and time.  Psychiatric:        Behavior: Behavior normal.      Musculoskeletal Exam: C-spine thoracic and lumbar spine were in good range of motion.  She has some tenderness in the lumbar paravertebral region.  Shoulder joints, elbow joints, wrist joints, MCPs PIPs and DIPs with good range of motion with no synovitis.  She had good range of motion of her left hip joint which is replaced.  She had tenderness on palpation of the right trochanteric bursa and IT band.  Knee joints in good range of motion without any warmth swelling or effusion.  There was no tenderness over ankles or MTPs.  CDAI Exam: CDAI Score: -- Patient Global: --; Provider Global: -- Swollen: --; Tender: -- Joint Exam 06/30/2022   No joint exam has been documented for this visit   There is currently no information documented on the homunculus. Go to the Rheumatology activity and complete the homunculus joint exam.  Investigation: No additional findings.  Imaging: No results found.  Recent Labs: Lab  Results  Component Value Date   WBC 11.2 (H) 02/25/2022   HGB 11.0 (L) 02/25/2022   PLT 218 02/25/2022   NA 135 02/25/2022   K 4.0 02/25/2022   CL 100 02/25/2022   CO2 25 02/25/2022   GLUCOSE 114 (H) 02/25/2022   BUN 8 02/25/2022   CREATININE 0.87 02/25/2022   BILITOT 0.4 02/22/2022   ALKPHOS 57 02/22/2022   AST 19 02/22/2022   ALT 15 02/22/2022   PROT 7.1 02/22/2022   ALBUMIN 3.6 02/22/2022   CALCIUM 8.3 (L) 02/25/2022   GFRAA 71 03/18/2021    Speciality Comments: No specialty comments available.  Procedures:  No procedures performed Allergies: Actonel [risedronate], Alendronate, and Boniva [ibandronic acid]   Assessment / Plan:     Visit Diagnoses: Morphea - Previously followed by Dr. Sharol Roussel at [facility].  She has extensive rash from morphea on her trunk and extremities.  Abnormal chest CT - Chest CT on 01/27/22: Findings in the lungs are concerning for potential interstitial lung disease.  Patient was evaluated by Dr. Chase Caller in April 2023.  He suspected that ILD is related to gastroesophageal reflux.  Patient had hiatal hernia repair in March 2023.  Her reflux symptoms have improved.  She has a follow-up appointment with Dr. Chase Caller and she is also scheduled for repeat high-resolution CT.  High risk medication use - Discontinued methotrexate in 2020 due to the concern for immunosuppression during the covid-19 pandemic.   Raynaud's phenomenon without gangrene-currently not active.  Chronic left shoulder pain-she has discomfort with range of motion.  Left hand paresthesia-she has been experiencing burning sensation in her left palm for the last 2 months.  I offered nerve conduction velocities but she declined.  Primary osteoarthritis of both hands-she has bilateral PIP and DIP thickening with no synovitis.  Status post total hip replacement, left-she had good range of motion without discomfort.  Trochanteric bursitis of right hip-she had tenderness on  palpation over right trochanteric bursa.  IT band stretches were demonstrated and handout was given.  Chronic bilateral lower back pain without sciatica-she has been experiencing some lower back discomfort.  She denies any sciatica.  The discomfort is mostly in the paravertebral region.  The symptoms improved with stretching.  I gave her a handout on back exercises.  Age-related osteoporosis without current pathological fracture - DEXA updated on 03/04/19: RFN BMD 0.558 with T-score -2.6.  She is on Prolia 60  mg sq injections every 6 months prescribed by Dr. Dagmar Hait.  Calcium rich diet and exercise were discussed.  Hiatal hernia - Chest CT performed on 01/27/2022: Small hiatal hernia. Status post hiatal hernia repair on 02/24/2022 performed by Dr. Kipp Brood.   Other medical problems are listed as follows:  History of hypercholesterolemia  History of gastroesophageal reflux (GERD)  History of IBS  History of migraine  History of depression  Orders: No orders of the defined types were placed in this encounter.  No orders of the defined types were placed in this encounter.    Follow-Up Instructions: Return in about 5 months (around 11/30/2022) for Morphea, ILD, OA, osteoporosis.   Bo Merino, MD  Note - This record has been created using Editor, commissioning.  Chart creation errors have been sought, but may not always  have been located. Such creation errors do not reflect on  the standard of medical care.

## 2022-06-21 ENCOUNTER — Encounter: Payer: Self-pay | Admitting: Certified Registered Nurse Anesthetist

## 2022-06-21 ENCOUNTER — Encounter: Payer: Self-pay | Admitting: Gastroenterology

## 2022-06-22 ENCOUNTER — Encounter: Payer: Self-pay | Admitting: Internal Medicine

## 2022-06-23 ENCOUNTER — Ambulatory Visit (AMBULATORY_SURGERY_CENTER): Payer: Medicare Other | Admitting: Gastroenterology

## 2022-06-23 ENCOUNTER — Encounter: Payer: Self-pay | Admitting: Gastroenterology

## 2022-06-23 VITALS — BP 113/56 | HR 73 | Temp 97.8°F | Resp 13 | Ht 63.0 in | Wt 154.0 lb

## 2022-06-23 DIAGNOSIS — Z86010 Personal history of colonic polyps: Secondary | ICD-10-CM | POA: Diagnosis not present

## 2022-06-23 DIAGNOSIS — Z09 Encounter for follow-up examination after completed treatment for conditions other than malignant neoplasm: Secondary | ICD-10-CM | POA: Diagnosis not present

## 2022-06-23 DIAGNOSIS — K635 Polyp of colon: Secondary | ICD-10-CM | POA: Diagnosis not present

## 2022-06-23 DIAGNOSIS — D122 Benign neoplasm of ascending colon: Secondary | ICD-10-CM

## 2022-06-23 MED ORDER — SODIUM CHLORIDE 0.9 % IV SOLN: 500.0000 mL | Freq: Once | INTRAVENOUS | Status: DC

## 2022-06-23 NOTE — Progress Notes (Signed)
Pt's states no medical or surgical changes since previsit or office visit. 

## 2022-06-23 NOTE — Progress Notes (Signed)
Called to room to assist during endoscopic procedure.  Patient ID and intended procedure confirmed with present staff. Received instructions for my participation in the procedure from the performing physician.  

## 2022-06-23 NOTE — Progress Notes (Signed)
Report given to PACU, vss 

## 2022-06-23 NOTE — Patient Instructions (Signed)
Thank you for coming in to see [place] today! Resume your diet and medications today. Return to normal daily activities tomorrow. Pamphlet given for hemorrhoidal banding.   YOU HAD AN ENDOSCOPIC PROCEDURE TODAY AT [facility]:   Refer to the procedure report that was given to you for any specific questions about what was found during the examination.  If the procedure report does not answer your questions, please call your gastroenterologist to clarify.  If you requested that your care partner not be given the details of your procedure findings, then the procedure report has been included in a sealed envelope for you to review at your convenience later.  YOU SHOULD EXPECT: Some feelings of bloating in the abdomen. Passage of more gas than usual.  Walking can help get rid of the air that was put into your GI tract during the procedure and reduce the bloating. If you had a lower endoscopy (such as a colonoscopy or flexible sigmoidoscopy) you may notice spotting of blood in your stool or on the toilet paper. If you underwent a bowel prep for your procedure, you may not have a normal bowel movement for a few days.  Please Note:  You might notice some irritation and congestion in your nose or some drainage.  This is from the oxygen used during your procedure.  There is no need for concern and it should clear up in a day or so.  SYMPTOMS TO REPORT IMMEDIATELY:  Following lower endoscopy (colonoscopy or flexible sigmoidoscopy):  Excessive amounts of blood in the stool  Significant tenderness or worsening of abdominal pains  Swelling of the abdomen that is new, acute  Fever of 100F or higher    For urgent or emergent issues, a gastroenterologist can be reached at any hour by calling [phone]. Do not use MyChart messaging for urgent concerns.    DIET:  We do recommend a small meal at first, but then you may proceed to your regular diet.  Drink plenty of fluids but you should avoid  alcoholic beverages for 24 hours.  ACTIVITY:  You should plan to take it easy for the rest of today and you should NOT DRIVE or use heavy machinery until tomorrow (because of the sedation medicines used during the test).    FOLLOW UP: Our staff will call the number listed on your records the next business day following your procedure.  We will call around 7:15- 8:00 am to check on you and address any questions or concerns that you may have regarding the information given to you following your procedure. If we do not reach you, we will leave a message.  If you develop any symptoms (ie: fever, flu-like symptoms, shortness of breath, cough etc.) before then, please call [phone].  If you test positive for Covid 19 in the 2 weeks post procedure, please call and report this information to [place].    If any biopsies were taken you will be contacted by phone or by letter within the next 1-3 weeks.  Please call us at [phone] if you have not heard about the biopsies in 3 weeks.    SIGNATURES/CONFIDENTIALITY: You and/or your care partner have signed paperwork which will be entered into your electronic medical record.  These signatures attest to the fact that that the information above on your After Visit Summary has been reviewed and is understood.  Full responsibility of the confidentiality of this discharge information lies with you and/or your care-partner.

## 2022-06-23 NOTE — Op Note (Signed)
[facility] Patient Name: Heather Lindsey Procedure Date: 06/23/2022 9:22 AM MRN: [id number] Endoscopist: Mallie Mussel L. Loletha Carrow , MD Age: 76 Referring MD:  Date of Birth: [birth year] Gender: Female Account #: [id number] Procedure:                Colonoscopy Indications:              High risk colon cancer surveillance: Personal                            history of sessile serrated colon polyp (less than                            10 mm in size) with no dysplasia                           cecal SSP May 2018 - no polyps 2009 Medicines:                Monitored Anesthesia Care Procedure:                Pre-Anesthesia Assessment:                           - Prior to the procedure, a History and Physical                            was performed, and patient medications and                            allergies were reviewed. The patient's tolerance of                            previous anesthesia was also reviewed. The risks                            and benefits of the procedure and the sedation                            options and risks were discussed with the patient.                            All questions were answered, and informed consent                            was obtained. Prior Anticoagulants: The patient has                            taken no previous anticoagulant or antiplatelet                            agents. ASA Grade Assessment: II - A patient with                            mild systemic disease. After reviewing the risks  and benefits, the patient was deemed in                            satisfactory condition to undergo the procedure.                           After obtaining informed consent, the colonoscope                            was passed under direct vision. Throughout the                            procedure, the patient's blood pressure, pulse, and                            oxygen saturations were monitored continuously.  The                            Olympus PCF-H190DL (#[id number]) Colonoscope was                            introduced through the anus and advanced to the the                            cecum, identified by appendiceal orifice and                            ileocecal valve. The colonoscopy was somewhat                            difficult due to multiple diverticula in the colon                            and a tortuous colon. Successful completion of the                            procedure was aided by using manual pressure,                            straightening and shortening the scope to obtain                            bowel loop reduction and water inflation. The                            patient tolerated the procedure well. The quality                            of the bowel preparation was fair in some areas and                            good in others after lavage. The ileocecal valve,  appendiceal orifice, and rectum were photographed.                            The bowel preparation used was SUPREP. Scope In: 9:41:43 AM Scope Out: 10:04:43 AM Scope Withdrawal Time: 0 hours 17 minutes 31 seconds  Total Procedure Duration: 0 hours 23 minutes 0 seconds  Findings:                 Prolapsed internal hemorrhoids were found on                            perianal exam. Also decreased resting sphincter                            tone.                           Three flat and semi-sessile polyps were found in                            the proximal ascending colon. The polyps were 2 to                            8 mm in size. These polyps were removed with a cold                            snare. Resection and retrieval were complete.                           Multiple diverticula were found in the left colon                            and right colon.                           Repeat examination of right colon under NBI                             performed.                           Internal hemorrhoids were found. The hemorrhoids                            were Grade III (internal hemorrhoids that prolapse                            but require manual reduction). - particularly                            posterior column                           The exam was otherwise without abnormality on  direct and retroflexion views. Complications:            No immediate complications. Estimated Blood Loss:     Estimated blood loss was minimal. Impression:               - Hemorrhoids found on perianal exam.                           - Three 2 to 8 mm polyps in the proximal ascending                            colon, removed with a cold snare. Resected and                            retrieved.                           - Diverticulosis in the left colon and in the right                            colon.                           - Internal hemorrhoids.                           - The examination was otherwise normal on direct                            and retroflexion views. Recommendation:           - Patient has a contact number available for                            emergencies. The signs and symptoms of potential                            delayed complications were discussed with the                            patient. Return to normal activities tomorrow.                            Written discharge instructions were provided to the                            patient.                           - Resume previous diet.                           - Continue present medications.                           - Await pathology results.                           -  Repeat colonoscopy is recommended for                            surveillance. The colonoscopy date will be                            determined after pathology results from today's                            exam become available for review. ---  split-dose                            PEG prep for next exam                           - Consider hemorrhoidal banding if symptomatic. [name], MD 06/23/2022 10:12:23 AM This report has been signed electronically.

## 2022-06-23 NOTE — Progress Notes (Signed)
History and Physical:  This patient presents for endoscopic testing for: Encounter Diagnosis  Name Primary?   Personal history of colonic polyps Yes    76 year old woman here for colonoscopy.  Subcentimeter cecal s/p May 2018, no polyps on last colonoscopy in 2009. Chronic IBS symptoms, no recent change, no rectal bleeding  Patient is otherwise without complaints or active issues today.   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   Brain bleed ([place])    Broken wrist    LEFT,2006   Cataract    BILATERAL NO SURGERY YET UPDATED 06/09/22   Circumscribed scleroderma    Depression    Diverticulosis    GERD (gastroesophageal reflux disease)    Hyperlipidemia    IBS (irritable bowel syndrome)    Memory loss    Migraines    Osteoarthritis    Osteoporosis    Stroke ([place]) 2015     Past Surgical History: Past Surgical History:  Procedure Laterality Date   ANKLE FRACTURE SURGERY Left    COLONOSCOPY     ESOPHAGEAL MANOMETRY N/A 12/29/2021   Procedure: ESOPHAGEAL MANOMETRY (EM);  Surgeon: Doran Stabler, MD;  Location: [facility];  Service: Gastroenterology;  Laterality: N/A;  pt will stay on her PPI   ESOPHAGOGASTRODUODENOSCOPY N/A 02/24/2022   Procedure: ESOPHAGOGASTRODUODENOSCOPY (EGD);  Surgeon: Lajuana Matte, MD;  Location: [facility];  Service: Thoracic;  Laterality: N/A;   POLYPECTOMY     TOTAL HIP ARTHROPLASTY Left 01/06/2019   Procedure: TOTAL HIP ARTHROPLASTY ANTERIOR APPROACH;  Surgeon: Dorna Leitz, MD;  Location: [facility];  Service: Orthopedics;  Laterality: Left;   TUBAL LIGATION     XI ROBOTIC ASSISTED HIATAL HERNIA REPAIR N/A 02/24/2022   Procedure: XI ROBOTIC ASSISTED HIATAL HERNIA REPAIR with Fundoplication using Myriad Matrix;  Surgeon: Lajuana Matte, MD;  Location: [facility];  Service: Thoracic;  Laterality: N/A;     Allergies: Allergies  Allergen Reactions   Actonel [Risedronate]     Severe chest pain   Alendronate Other (See Comments)    Severe chest pain   Boniva [Ibandronic Acid]     Chest pain    Outpatient Meds: Current Outpatient Medications  Medication Sig Dispense Refill   acetaminophen (TYLENOL) 325 MG tablet Take 2 tablets (650 mg total) by mouth every 6 (six) hours as needed for mild pain.     ALPRAZolam (XANAX) 0.5 MG tablet Take 0.25 mg by mouth at bedtime as needed for anxiety or sleep.     baclofen (LIORESAL) 10 MG tablet TAKE 1 TABLET BY MOUTH AT 8 AM  AND 2 PM AS NEEDED 60 tablet 0   carboxymethylcellulose (REFRESH PLUS) 0.5 % SOLN Place 1 drop into both eyes 3 (three) times daily as needed (dry eyes).     fluticasone (FLONASE) 50 MCG/ACT nasal spray Place 1 spray into both nostrils daily.     hyoscyamine (LEVSIN) 0.125 MG tablet Take 0.125 mg by mouth every 4 (four) hours as needed for bladder spasms or cramping.     Multiple Vitamin (MULTI-VITAMIN DAILY PO) Take 1 tablet by mouth daily.     venlafaxine XR (EFFEXOR-XR) 150 MG 24 hr capsule Take 150 mg by mouth daily with breakfast.     Cholecalciferol 50 MCG (2000 UT) CAPS Take 2,000 Units by mouth daily.     denosumab (PROLIA) 60 MG/ML SOSY injection Inject  60 mg into the skin every 6 (six) months.     diclofenac Sodium (VOLTAREN) 1 % GEL Apply 1 application. topically daily as needed (pain).     loratadine (CLARITIN) 10 MG tablet Take 10 mg by mouth daily as needed for allergies.     Olopatadine HCl 0.2 % SOLN Place 1 drop into both eyes daily.     Current Facility-Administered Medications  Medication Dose Route Frequency Provider Last Rate Last Admin   0.9 %  sodium chloride infusion  500 mL Intravenous Once Doran Stabler, MD          ___________________________________________________________________ Objective   Exam:  BP 120/72   Pulse 67   Temp 97.8 F (36.6 C) (Temporal)   Resp 18   Ht '5\' 3"'  (1.6  m)   Wt 154 lb (69.9 kg)   SpO2 100%   BMI 27.28 kg/m   CV: RRR without murmur, S1/S2 Resp: clear to auscultation bilaterally, normal RR and effort noted GI: soft, no tenderness, with active bowel sounds.   Assessment: Encounter Diagnosis  Name Primary?   Personal history of colonic polyps Yes     Plan: Colonoscopy  The benefits and risks of the planned procedure were described in detail with the patient or (when appropriate) their health care proxy.  Risks were outlined as including, but not limited to, bleeding, infection, perforation, adverse medication reaction leading to cardiac or pulmonary decompensation, pancreatitis (if ERCP).  The limitation of incomplete mucosal visualization was also discussed.  No guarantees or warranties were given.    The patient is appropriate for an endoscopic procedure in the ambulatory setting.   - Wilfrid Lund, MD

## 2022-06-24 ENCOUNTER — Telehealth: Payer: Self-pay

## 2022-06-24 NOTE — Telephone Encounter (Signed)
  Follow up Call-     06/23/2022    8:38 AM 10/13/2021   10:07 AM  Call back number  Post procedure Call Back phone  # [phone]  Permission to leave phone message Yes Yes     Patient questions:  Do you have a fever, pain , or abdominal swelling? No. Pain Score  0 *  Have you tolerated food without any problems? Yes.    Have you been able to return to your normal activities? Yes.    Do you have any questions about your discharge instructions: Diet   No. Medications  No. Follow up visit  No.  Do you have questions or concerns about your Care? No.  Actions: * If pain score is 4 or above: No action needed, pain <4.

## 2022-06-27 ENCOUNTER — Encounter: Payer: Self-pay | Admitting: Gastroenterology

## 2022-06-30 ENCOUNTER — Ambulatory Visit: Payer: Medicare Other | Admitting: Rheumatology

## 2022-06-30 ENCOUNTER — Encounter: Payer: Self-pay | Admitting: Rheumatology

## 2022-06-30 VITALS — BP 126/84 | HR 75 | Ht 63.0 in | Wt 153.4 lb

## 2022-06-30 DIAGNOSIS — I7300 Raynaud's syndrome without gangrene: Secondary | ICD-10-CM

## 2022-06-30 DIAGNOSIS — K449 Diaphragmatic hernia without obstruction or gangrene: Secondary | ICD-10-CM

## 2022-06-30 DIAGNOSIS — Z8639 Personal history of other endocrine, nutritional and metabolic disease: Secondary | ICD-10-CM

## 2022-06-30 DIAGNOSIS — G8929 Other chronic pain: Secondary | ICD-10-CM

## 2022-06-30 DIAGNOSIS — Z8659 Personal history of other mental and behavioral disorders: Secondary | ICD-10-CM

## 2022-06-30 DIAGNOSIS — M25512 Pain in left shoulder: Secondary | ICD-10-CM

## 2022-06-30 DIAGNOSIS — Z8669 Personal history of other diseases of the nervous system and sense organs: Secondary | ICD-10-CM

## 2022-06-30 DIAGNOSIS — M7061 Trochanteric bursitis, right hip: Secondary | ICD-10-CM

## 2022-06-30 DIAGNOSIS — R9389 Abnormal findings on diagnostic imaging of other specified body structures: Secondary | ICD-10-CM | POA: Diagnosis not present

## 2022-06-30 DIAGNOSIS — L940 Localized scleroderma [morphea]: Secondary | ICD-10-CM | POA: Diagnosis not present

## 2022-06-30 DIAGNOSIS — Z79899 Other long term (current) drug therapy: Secondary | ICD-10-CM

## 2022-06-30 DIAGNOSIS — M545 Low back pain, unspecified: Secondary | ICD-10-CM

## 2022-06-30 DIAGNOSIS — M19041 Primary osteoarthritis, right hand: Secondary | ICD-10-CM

## 2022-06-30 DIAGNOSIS — M19042 Primary osteoarthritis, left hand: Secondary | ICD-10-CM

## 2022-06-30 DIAGNOSIS — R202 Paresthesia of skin: Secondary | ICD-10-CM

## 2022-06-30 DIAGNOSIS — M810 Age-related osteoporosis without current pathological fracture: Secondary | ICD-10-CM

## 2022-06-30 DIAGNOSIS — Z96642 Presence of left artificial hip joint: Secondary | ICD-10-CM

## 2022-06-30 DIAGNOSIS — Z8719 Personal history of other diseases of the digestive system: Secondary | ICD-10-CM

## 2022-06-30 NOTE — Patient Instructions (Signed)
Back Exercises The following exercises strengthen the muscles that help to support the trunk (torso) and back. They also help to keep the lower back flexible. Doing these exercises can help to prevent or lessen existing low back pain. If you have back pain or discomfort, try doing these exercises 2-3 times each day or as told by your health care provider. As your pain improves, do them once each day, but increase the number of times that you repeat the steps for each exercise (do more repetitions). To prevent the recurrence of back pain, continue to do these exercises once each day or as told by your health care provider. Do exercises exactly as told by your health care provider and adjust them as directed. It is normal to feel mild stretching, pulling, tightness, or discomfort as you do these exercises, but you should stop right away if you feel sudden pain or your pain gets worse. Exercises Single knee to chest Repeat these steps 3-5 times for each leg: Lie on your back on a firm bed or the floor with your legs extended. Bring one knee to your chest. Your other leg should stay extended and in contact with the floor. Hold your knee in place by grabbing your knee or thigh with both hands and hold. Pull on your knee until you feel a gentle stretch in your lower back or buttocks. Hold the stretch for 10-30 seconds. Slowly release and straighten your leg.  Pelvic tilt Repeat these steps 5-10 times: Lie on your back on a firm bed or the floor with your legs extended. Bend your knees so they are pointing toward the ceiling and your feet are flat on the floor. Tighten your lower abdominal muscles to press your lower back against the floor. This motion will tilt your pelvis so your tailbone points up toward the ceiling instead of pointing to your feet or the floor. With gentle tension and even breathing, hold this position for 5-10 seconds.  Cat-cow Repeat these steps until your lower back becomes  more flexible: Get into a hands-and-knees position on a firm bed or the floor. Keep your hands under your shoulders, and keep your knees under your hips. You may place padding under your knees for comfort. Let your head hang down toward your chest. Contract your abdominal muscles and point your tailbone toward the floor so your lower back becomes rounded like the back of a cat. Hold this position for 5 seconds. Slowly lift your head, let your abdominal muscles relax, and point your tailbone up toward the ceiling so your back forms a sagging arch like the back of a cow. Hold this position for 5 seconds.  Press-ups Repeat these steps 5-10 times: Lie on your abdomen (face-down) on a firm bed or the floor. Place your palms near your head, about shoulder-width apart. Keeping your back as relaxed as possible and keeping your hips on the floor, slowly straighten your arms to raise the top half of your body and lift your shoulders. Do not use your back muscles to raise your upper torso. You may adjust the placement of your hands to make yourself more comfortable. Hold this position for 5 seconds while you keep your back relaxed. Slowly return to lying flat on the floor.  Bridges Repeat these steps 10 times: Lie on your back on a firm bed or the floor. Bend your knees so they are pointing toward the ceiling and your feet are flat on the floor. Your arms should be flat   at your sides, next to your body. Tighten your buttocks muscles and lift your buttocks off the floor until your waist is at almost the same height as your knees. You should feel the muscles working in your buttocks and the back of your thighs. If you do not feel these muscles, slide your feet 1-2 inches (2.5-5 cm) farther away from your buttocks. Hold this position for 3-5 seconds. Slowly lower your hips to the starting position, and allow your buttocks muscles to relax completely. If this exercise is too easy, try doing it with your arms  crossed over your chest. Abdominal crunches Repeat these steps 5-10 times: Lie on your back on a firm bed or the floor with your legs extended. Bend your knees so they are pointing toward the ceiling and your feet are flat on the floor. Cross your arms over your chest. Tip your chin slightly toward your chest without bending your neck. Tighten your abdominal muscles and slowly raise your torso high enough to lift your shoulder blades a tiny bit off the floor. Avoid raising your torso higher than that because it can put too much stress on your lower back and does not help to strengthen your abdominal muscles. Slowly return to your starting position.  Back lifts Repeat these steps 5-10 times: Lie on your abdomen (face-down) with your arms at your sides, and rest your forehead on the floor. Tighten the muscles in your legs and your buttocks. Slowly lift your chest off the floor while you keep your hips pressed to the floor. Keep the back of your head in line with the curve in your back. Your eyes should be looking at the floor. Hold this position for 3-5 seconds. Slowly return to your starting position.  Contact a health care provider if: Your back pain or discomfort gets much worse when you do an exercise. Your worsening back pain or discomfort does not lessen within 2 hours after you exercise. If you have any of these problems, stop doing these exercises right away. Do not do them again unless your health care provider says that you can. Get help right away if: You develop sudden, severe back pain. If this happens, stop doing the exercises right away. Do not do them again unless your health care provider says that you can. This information is not intended to replace advice given to you by your health care provider. Make sure you discuss any questions you have with your health care provider. Document Revised: 05/25/2021 Document Reviewed: 02/10/2021 Elsevier Patient Education  Waterflow Band Syndrome Rehab Ask your health care provider which exercises are safe for you. Do exercises exactly as told by your health care provider and adjust them as directed. It is normal to feel mild stretching, pulling, tightness, or discomfort as you do these exercises. Stop right away if you feel sudden pain or your pain gets significantly worse. Do not begin these exercises until told by your health care provider. Stretching and range-of-motion exercises These exercises warm up your muscles and joints and improve the movement and flexibility of your hip and pelvis. Quadriceps stretch, prone  Lie on your abdomen (prone position) on a firm surface, such as a bed or padded floor. Bend your left / right knee and reach back to hold your ankle or pant leg. If you cannot reach your ankle or pant leg, loop a belt around your foot and grab the belt instead. Gently pull your heel toward your buttocks. Your knee  should not slide out to the side. You should feel a stretch in the front of your thigh and knee (quadriceps). Hold this position for __________ seconds. Repeat __________ times. Complete this exercise __________ times a day. Iliotibial band stretch An iliotibial band is a strong band of muscle tissue that runs from the outer side of your hip to the outer side of your thigh and knee. Lie on your side with your left / right leg in the top position. Bend both of your knees and grab your left / right ankle. Stretch out your bottom arm to help you balance. Slowly bring your top knee back so your thigh goes behind your trunk. Slowly lower your top leg toward the floor until you feel a gentle stretch on the outside of your left / right hip and thigh. If you do not feel a stretch and your knee will not fall farther, place the heel of your other foot on top of your knee and pull your knee down toward the floor with your foot. Hold this position for __________ seconds. Repeat __________ times.  Complete this exercise __________ times a day. Strengthening exercises These exercises build strength and endurance in your hip and pelvis. Endurance is the ability to use your muscles for a long time, even after they get tired. Straight leg raises, side-lying This exercise strengthens the muscles that rotate the leg at the hip and move it away from your body (hip abductors). Lie on your side with your left / right leg in the top position. Lie so your head, shoulder, hip, and knee line up. You may bend your bottom knee to help you balance. Roll your hips slightly forward so your hips are stacked directly over each other and your left / right knee is facing forward. Tense the muscles in your outer thigh and lift your top leg 4-6 inches (10-15 cm). Hold this position for __________ seconds. Slowly lower your leg to return to the starting position. Let your muscles relax completely before doing another repetition. Repeat __________ times. Complete this exercise __________ times a day. Leg raises, prone This exercise strengthens the muscles that move the hips backward (hip extensors). Lie on your abdomen (prone position) on your bed or a firm surface. You can put a pillow under your hips if that is more comfortable for your lower back. Bend your left / right knee so your foot is straight up in the air. Squeeze your buttocks muscles and lift your left / right thigh off the bed. Do not let your back arch. Tense your thigh muscle as hard as you can without increasing any knee pain. Hold this position for __________ seconds. Slowly lower your leg to return to the starting position and allow it to relax completely. Repeat __________ times. Complete this exercise __________ times a day. Hip hike Stand sideways on a bottom step. Stand on your left / right leg with your other foot unsupported next to the step. You can hold on to a railing or wall for balance if needed. Keep your knees straight and your  torso square. Then lift your left / right hip up toward the ceiling. Slowly let your left / right hip lower toward the floor, past the starting position. Your foot should get closer to the floor. Do not lean or bend your knees. Repeat __________ times. Complete this exercise __________ times a day. This information is not intended to replace advice given to you by your health care provider. Make sure you  discuss any questions you have with your health care provider. Document Revised: 02/05/2020 Document Reviewed: 02/05/2020 Elsevier Patient Education  [place].

## 2022-07-01 ENCOUNTER — Ambulatory Visit (INDEPENDENT_AMBULATORY_CARE_PROVIDER_SITE_OTHER): Payer: Medicare Other | Admitting: Thoracic Surgery (Cardiothoracic Vascular Surgery)

## 2022-07-01 ENCOUNTER — Telehealth: Payer: Medicare Other | Admitting: Thoracic Surgery (Cardiothoracic Vascular Surgery)

## 2022-07-01 ENCOUNTER — Encounter: Payer: Self-pay | Admitting: Gastroenterology

## 2022-07-01 DIAGNOSIS — K449 Diaphragmatic hernia without obstruction or gangrene: Secondary | ICD-10-CM

## 2022-07-01 NOTE — Progress Notes (Signed)
     [address]       [place] [address]             [phone]       Patient: Home Provider: Office Consent for Telemedicine visit obtained.  Today's visit was completed via a real-time telehealth (see specific modality noted below). The patient/authorized person provided oral consent at the time of the visit to engage in a telemedicine encounter with the present provider at [facility]. The patient/authorized person was informed of the potential benefits, limitations, and risks of telemedicine. The patient/authorized person expressed understanding that the laws that protect confidentiality also apply to telemedicine. The patient/authorized person acknowledged understanding that telemedicine does not provide emergency services and that he or she would need to call 911 or proceed to the nearest hospital for help if such a need arose.   Total time spent in the clinical discussion 10 minutes.  Telehealth Modality: Phone visit (audio only)  I had a telephone visit with Heather Lindsey.  She is s/p hiatal hernia repair with fundoplication.  Overall she is doing well.  She denies any reflux or dysphagia.  She continues to follow-up with her gastroenterologist.  She will touch base with [place] if there is any issues in the future.  [name] Bary Leriche

## 2022-07-19 ENCOUNTER — Other Ambulatory Visit (HOSPITAL_COMMUNITY): Payer: Self-pay | Admitting: *Deleted

## 2022-07-20 ENCOUNTER — Ambulatory Visit (HOSPITAL_COMMUNITY)
Admission: RE | Admit: 2022-07-20 | Discharge: 2022-07-20 | Disposition: A | Discharge status: Home / Self Care | Payer: Medicare Other | Source: Ambulatory Visit | Attending: Internal Medicine | Admitting: Internal Medicine

## 2022-07-20 ENCOUNTER — Ambulatory Visit
Admission: RE | Admit: 2022-07-20 | Discharge: 2022-07-20 | Disposition: A | Discharge status: Home / Self Care | Payer: Medicare Other | Source: Ambulatory Visit | Attending: Internal Medicine | Admitting: Internal Medicine

## 2022-07-20 DIAGNOSIS — M810 Age-related osteoporosis without current pathological fracture: Secondary | ICD-10-CM | POA: Diagnosis present

## 2022-07-20 DIAGNOSIS — J849 Interstitial pulmonary disease, unspecified: Secondary | ICD-10-CM

## 2022-07-20 MED ORDER — DENOSUMAB 60 MG/ML ~~LOC~~ SOSY
60.0000 mg | PREFILLED_SYRINGE | Freq: Once | SUBCUTANEOUS | Status: AC
Start: 2022-07-20 — End: 2022-07-20
  Administered 2022-07-20: 60 mg via SUBCUTANEOUS

## 2022-07-20 MED ORDER — DENOSUMAB 60 MG/ML ~~LOC~~ SOSY
PREFILLED_SYRINGE | SUBCUTANEOUS | Status: AC
  Filled 2022-07-20: qty 1

## 2022-07-21 NOTE — Progress Notes (Signed)
Ucnhanged fibrosis. Will see her 08/18/22 after PFT. Wil not be callin in with results

## 2022-08-10 ENCOUNTER — Ambulatory Visit: Payer: Medicare Other | Admitting: Gastroenterology

## 2022-08-14 IMAGING — RF DG ESOPHAGUS
3 series · 11 of 11 positions shown · IV contrast (omnipaque)
Comparison: None.

CLINICAL DATA: Post Nissen fundoplication

EXAM:
ESOPHOGRAM/BARIUM SWALLOW
TECHNIQUE: Single contrast examination was performed using water soluble
Omnipaque 300.
FLUOROSCOPY:
Radiation Exposure Index (as provided by the fluoroscopic device):
3.1 mGy mGy Kerma

[Series 1: cp_standard · 0.52mm/px · 4 of 71 frames shown (1 of 3)]
[frame 11/71]
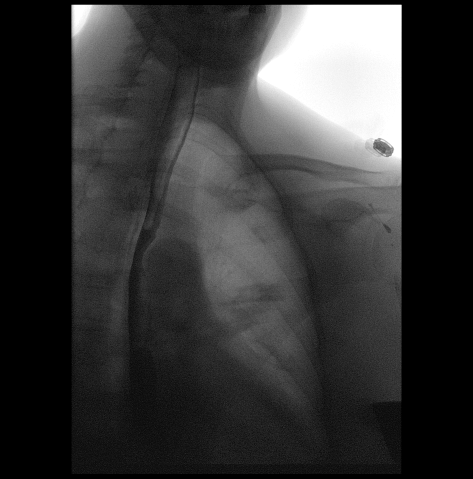
[frame 15/71]
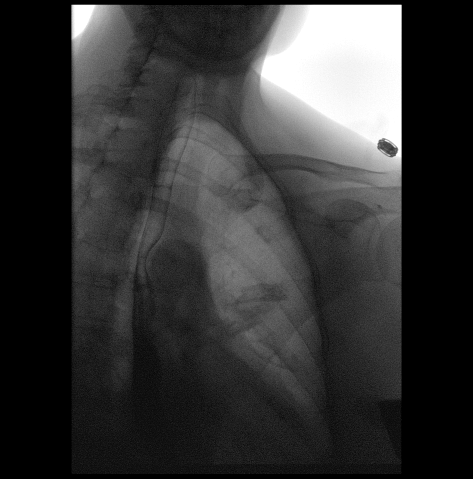
[frame 36/71]
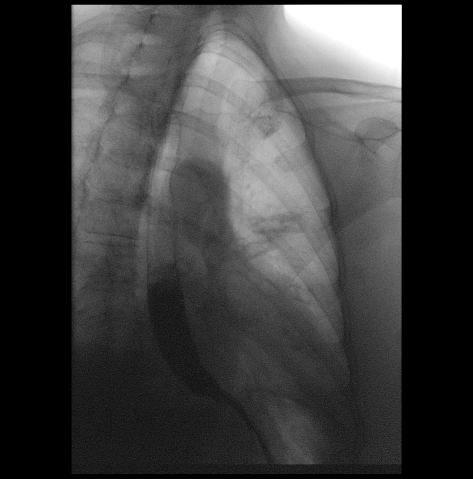
[frame 61/71]
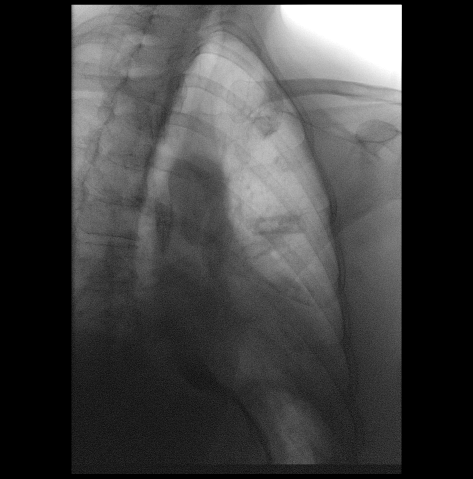

[Series 2: cp_standard · 0.52mm/px · 4 of 122 frames shown (2 of 3)]
[frame 4/122]
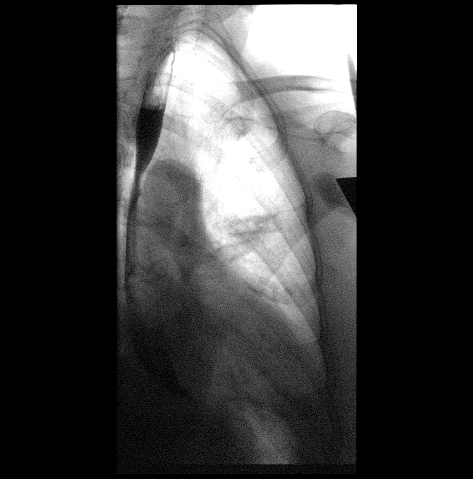
[frame 19/122]
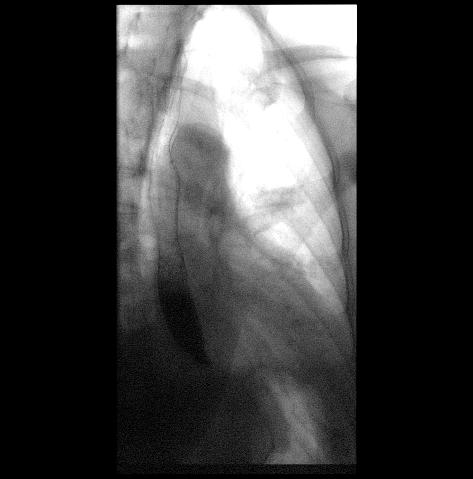
[frame 62/122]
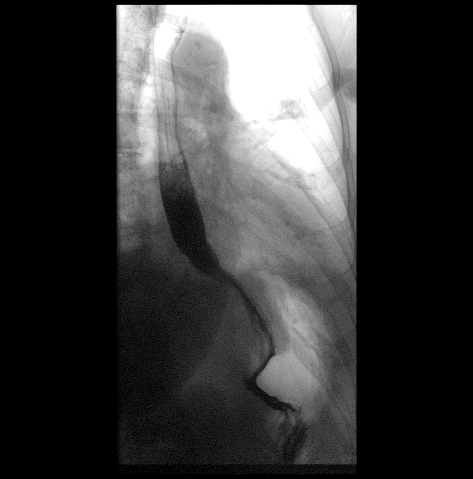
[frame 104/122]
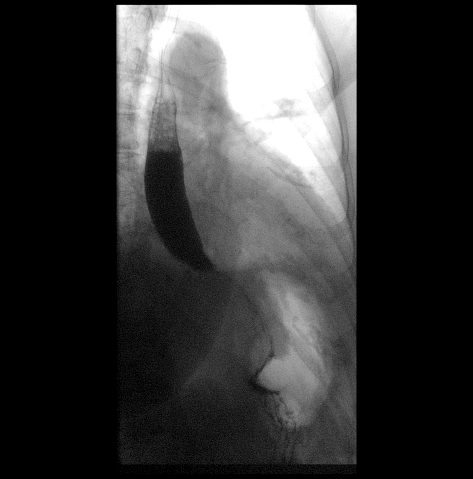

[Series 3: cp_standard · 0.52mm/px · 3 of 29 frames shown (3 of 3)]
[frame 5/29]
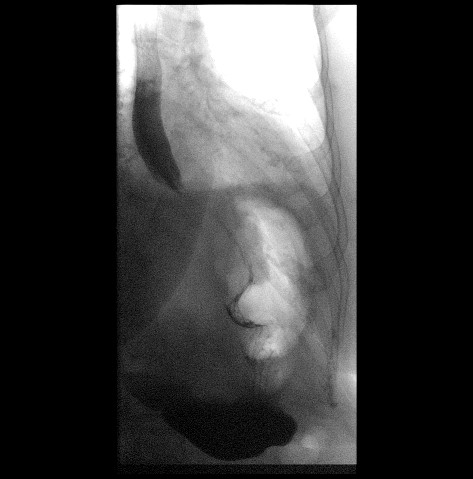
[frame 15/29]
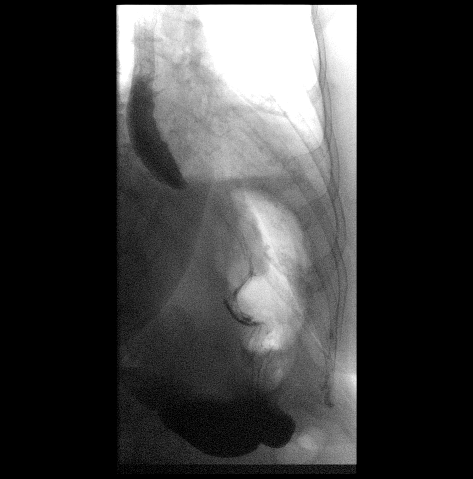
[frame 25/29]
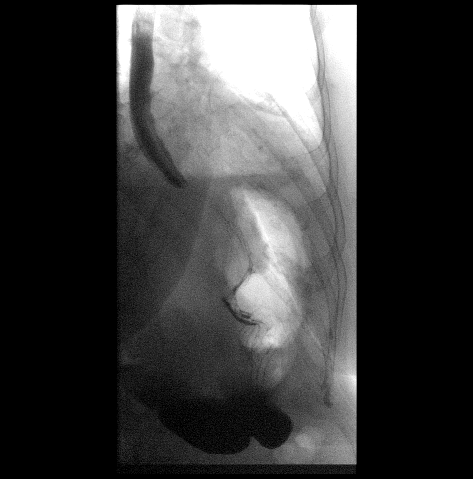

[11 of 11 positions shown; findings below may reference images not displayed]

FINDINGS: Patient swallowed contrast without difficulty. There is narrowing at
the gastroesophageal junction with mild holdup of contrast passage.
No hiatal hernia. No apparent gastroesophageal reflux. No evidence
of contrast extravasation.
IMPRESSION: Narrowing at the gastroesophageal junction with mild holdup of
contrast likely reflecting expected changes of fundoplication with
postoperative edema. No leak.

## 2022-08-18 ENCOUNTER — Ambulatory Visit (INDEPENDENT_AMBULATORY_CARE_PROVIDER_SITE_OTHER): Payer: Medicare Other | Admitting: Internal Medicine

## 2022-08-18 ENCOUNTER — Encounter: Payer: Self-pay | Admitting: Internal Medicine

## 2022-08-18 ENCOUNTER — Telehealth: Payer: Self-pay | Admitting: Internal Medicine

## 2022-08-18 ENCOUNTER — Ambulatory Visit: Payer: Medicare Other | Admitting: Internal Medicine

## 2022-08-18 VITALS — BP 122/70 | HR 53 | Temp 97.9°F | Ht 62.5 in | Wt 152.0 lb

## 2022-08-18 DIAGNOSIS — Z9889 Other specified postprocedural states: Secondary | ICD-10-CM

## 2022-08-18 DIAGNOSIS — R0989 Other specified symptoms and signs involving the circulatory and respiratory systems: Secondary | ICD-10-CM

## 2022-08-18 DIAGNOSIS — K449 Diaphragmatic hernia without obstruction or gangrene: Secondary | ICD-10-CM

## 2022-08-18 DIAGNOSIS — J849 Interstitial pulmonary disease, unspecified: Secondary | ICD-10-CM | POA: Diagnosis not present

## 2022-08-18 DIAGNOSIS — Z8679 Personal history of other diseases of the circulatory system: Secondary | ICD-10-CM

## 2022-08-18 LAB — PULMONARY FUNCTION TEST
DL/VA % pred: 67 %
DL/VA: 2.81 ml/min/mmHg/L
DLCO cor % pred: 71 %
DLCO cor: 12.89 ml/min/mmHg
DLCO unc % pred: 71 %
DLCO unc: 12.89 ml/min/mmHg
FEF 25-75 Post: 2.36 L/sec
FEF 25-75 Pre: 2.49 L/sec
FEF2575-%Change-Post: -5 %
FEF2575-%Pred-Post: 150 %
FEF2575-%Pred-Pre: 158 %
FEV1-%Change-Post: -1 %
FEV1-%Pred-Post: 118 %
FEV1-%Pred-Pre: 119 %
FEV1-Post: 2.33 L
FEV1-Pre: 2.36 L
FEV1FVC-%Change-Post: 0 %
FEV1FVC-%Pred-Pre: 109 %
FEV6-%Change-Post: -1 %
FEV6-%Pred-Post: 113 %
FEV6-%Pred-Pre: 115 %
FEV6-Post: 2.83 L
FEV6-Pre: 2.88 L
FEV6FVC-%Change-Post: 0 %
FEV6FVC-%Pred-Post: 105 %
FEV6FVC-%Pred-Pre: 105 %
FVC-%Change-Post: -1 %
FVC-%Pred-Post: 107 %
FVC-%Pred-Pre: 109 %
FVC-Post: 2.83 L
FVC-Pre: 2.88 L
Post FEV1/FVC ratio: 82 %
Post FEV6/FVC ratio: 100 %
Pre FEV1/FVC ratio: 82 %
Pre FEV6/FVC Ratio: 100 %
RV % pred: 80 %
RV: 1.79 L
TLC % pred: 101 %
TLC: 4.91 L

## 2022-08-18 NOTE — Telephone Encounter (Signed)
Thanks for the note.  I will talk with her when she sees me soon for hemorrhoidal banding.  Generally, repeat pH testing would not be needed unless there was question of recurrence patient on symptoms and upper GI series was unrevealing.  - HD

## 2022-08-18 NOTE — Patient Instructions (Addendum)
ILD (interstitial lung disease) ([place]) Hiatal hernia Status post Nissen fundoplication History of Raynaud's syndrome   - mild/early ILD present - unclear if this is IPF v non-IPF;  - GERD is a MAJOR risk factor and surgery helps stabilize lungs - doubt morphea and raynaud are risk facotrs for you  -if you hve feather related pillow or sofar it is a risk factor - forgot to check with you last time  plan - given recent surgery will hold off on any lung biopsy at this point in time - instead will monitor closely   - if progresses need to consider anti-fibrotic - pirfenidone v nintedanib - will need to nesure GERD is under full good control   - will message [facility] to see if we need to do redp pH probe study - repeat spirometry and dlco in 3 months  - get RSV, flu and covid vaccines in fall 2023 - encourage weight loss   - talk to PCP about ozempic; indicated in BMI >= 27 which you are - refer exercise program if you are interested  - pulmonary rehab -get rid of bird and feather pillow and sofa If you hae it at home   [place]  Plan  - ensure you get covid testing at home   Followup - 3 months but after spirome /dlco  - symptoms score and walk test at followu

## 2022-08-18 NOTE — Progress Notes (Signed)
PFT done today. 

## 2022-08-18 NOTE — Progress Notes (Signed)
OV 03/17/2022  Subjective:  Patient ID: Heather Lindsey, female , DOB: January 14, 1946 , age 76 y.o. , MRN: [id number] , ADDRESS: [place] [id number] PCP Prince Solian, MD Patient Care Team: Prince Solian, MD as PCP - General (Internal Medicine)  This Provider for this visit: Treatment Team:  Attending Provider: Brand Males, MD    03/17/2022 -   Chief Complaint  Patient presents with   Consult    Pt is being seen due to recent CT.  Pt has no current complaints.     HPI Heather Lindsey 76 y.o. -referred by primary care physician Dr. Elsworth Soho for evaluation of possible interstitial lung disease.  Also sees Dr. Estanislado Pandy in rheumatology for morphea.  She tells me that she has had acid reflux for the last several years.  She is also had previous Raynaud's but review the records indicate detailed connective tissue disease work-up is negative.  As part of this she was diagnosed have hiatal hernia and then she had a CT scan that showed significant hiatal hernia then on 02/24/2022 she underwent hiatal hernia surgery by Dr. Doy Hutching.  Specifically surgery was status post esophagoscopy and hiatal hernia repair with [facility] admission nisin fundoplication.  Before the surgery she was having cough and acid reflux.  Both the symptoms have resolved.  She has never had dyspnea on exertion except when climbing heavy set of stairs this thing is still persistent.  And it is only mild.  Overall she is doing well.  But she is here for the ILD.  She does do water aerobics 3 times a week before the surgery and she never had any shortness of breath for that.  Specifically ILD question as below   [place] Comprehensive ILD Questionnaire  Symptoms:      Past Medical History :  -Morphea sees rheumatology Acid reflux with hiatal hernia status post Nissen fundoplication mid March 6568 by Dr. Kipp Brood Has osteoarthritis Has acid reflux controlled after hiatal hernia  surgery 2015 right frontal lobe brain bleed 1968 infectious mononucleosis Raynaud NOS   ROS:  Positive for fatigue and arthralgia.  Positive for dry eyes.  Positive for Raynaud's.  6 connective tissue disease autoimmune antibody panel negative.  Positive for acid reflux but now controlled after surgery  FAMILY HISTORY of LUNG DISEASE:  Denies family history of lung disease*  PERSONAL EXPOSURE HISTORY:  -Started smoking 1966.  Quit smoking 1971.  10 to 15 cigarettes a day.  No marijuana use no cocaine use.  No intravenous drug use  HOME  EXPOSURE and HOBBY DETAILS :  [place].  The home itself is 75 years old she uses a steam iron but there is no mildew in it.  . Pt said that she used to have a cockatiel but he died years ago. Pt said that she does have a sofa that has feathers in it. ..  She does have feather pillow and sulfa  OCCUPATIONAL HISTORY (122 questions) : Detail organic and inorganic antigen exposure history is negative.  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  She has been on methotrexate for morphea 15 or 20 years ago and tries to avoid it.  She was on some prednisone 5 years ago.  INVESTIGATIONS:   She has had a CT chest with contrast: I personally visualized  this and showed it to her.  There is some ILD -there is a craniocaudal gradient.  It is very mild.  Is either postinflammatory or indeterminate for UIP pattern.  Any with a contrast CT was done in February 2023 before the hiatal hernia surgery   OV 08/18/2022  Subjective:  Patient ID: Heather Lindsey, female , DOB: [birth year] , age 76 y.o. , MRN: [id number] , ADDRESS: [place] [address] PCP Prince Solian, MD Patient Care Team: Prince Solian, MD as PCP - General (Internal Medicine)  This Provider for this visit: Treatment Team:   Attending Provider: Brand Males, MD    08/18/2022 -   Chief Complaint  Patient presents with   Follow-up    PFT performed today.  Pt denies any complaints of SOB. Does have an occasional cough.     HPI Heather Lindsey 76 y.o. -returns for follow-up.  She presents with her husband who is a retired TEFL teacher person who is involved with hemophilia medications.  She continues to feel stable.  She has very minimal shortness of breath with exertion.  She has some cough just like last time but she tells me it is actually acute and she feels she has allergies for the last few days.  She has not tested herself for COVID.  Currently there is widespread COVID in the community.  There is no fever or chills or other COVID symptoms.  In any event she is here for her ILD work-up.  She is not on any proton pump inhibitors.  Her pulmonary function test essentially normal except for a very slight reduction in DLCO.  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  Her serologic work-up was negative.      SYMPTOM SCALE - ILD 03/17/2022 08/18/2022   Current weight    O2 use ra ra  Shortness of Breath 0 -> 5 scale with 5 being worst (score 6 If unable to do)   At rest 0 0  Simple tasks - showers, clothes change, eating, shaving 0 00  Household (dishes, doing bed, laundry) 0 0  Shopping 0 0  Walking level at own pace 0 0  Walking up Stairs 1.5 1  Total (30-36) Dyspnea Score 1.5 1  How bad is your cough? 2 2  How bad is your fatigue 3 3  How bad is nausea 0 0  How bad is vomiting?  0 0  How bad is diarrhea? 0 0  How bad is anxiety? 3 2.5  How bad is depression 3 2  Any chronic pain - if so where and how bad 3 x       Simple office walk 185 feet x  3 laps goal with forehead probe 03/17/2022    O2 used ra   Number laps completed 3   Comments about pace avg   Resting Pulse Ox/HR 100% and 79/min   Final Pulse  Ox/HR 100% and 1051/min   Desaturated </= 88% no   Desaturated <= 3% points no   Got Tachycardic >/= 90/min yes   Symptoms at end of test No complaitns   Miscellaneous comments x    PFT     Latest Ref Rng & Units 08/18/2022    8:57 AM  PFT Results  FVC-Pre L 2.88  P  FVC-Predicted Pre % 109  P  FVC-Post L 2.83  P  FVC-Predicted Post % 107  P  Pre FEV1/FVC % %  82  P  Post FEV1/FCV % % 82  P  FEV1-Pre L 2.36  P  FEV1-Predicted Pre % 119  P  FEV1-Post L 2.33  P  DLCO uncorrected ml/min/mmHg 12.89  P  DLCO UNC% % 71  P  DLCO corrected ml/min/mmHg 12.89  P  DLCO COR %Predicted % 71  P  DLVA Predicted % 67  P  TLC L 4.91  P  TLC % Predicted % 101  P  RV % Predicted % 80  P    P Preliminary result   HRCT 07/20/22  Narrative & Impression  CLINICAL DATA:  Interstitial lung disease, former smoker   EXAM: CT CHEST WITHOUT CONTRAST   TECHNIQUE: Multidetector CT imaging of the chest was performed following the standard protocol without intravenous contrast. High resolution imaging of the lungs, as well as inspiratory and expiratory imaging, was performed.   RADIATION DOSE REDUCTION: This exam was performed according to the departmental dose-optimization program which includes automated exposure control, adjustment of the mA and/or kV according to patient size and/or use of iterative reconstruction technique.   COMPARISON:  01/27/2022   FINDINGS: Cardiovascular: Scattered aortic atherosclerosis. Normal heart size. No pericardial effusion.   Mediastinum/Nodes: No enlarged mediastinal, hilar, or axillary lymph nodes. Small hiatal hernia. Thyroid gland, trachea, and esophagus demonstrate no significant findings.   Lungs/Pleura: Unchanged mild pulmonary fibrosis in a pattern with apical to basal gradient, featuring irregular peripheral interstitial opacity, septal thickening, traction bronchiectasis, and minimal subpleural bronchiolectasis at the bilateral lung bases. No  significant air trapping on expiratory phase imaging. No pleural effusion or pneumothorax.   Upper Abdomen: No acute abnormality.   Musculoskeletal: No chest wall abnormality. No acute osseous findings.   IMPRESSION: 1. Unchanged mild pulmonary fibrosis in a pattern with apical to basal gradient, featuring irregular peripheral interstitial opacity, septal thickening, traction bronchiectasis, and minimal subpleural bronchiolectasis at the bilateral lung bases. 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 [place], Iss 5, [phone], Aug 12 2017. 2. Small hiatal hernia.   Aortic Atherosclerosis (ICD10-I70.0).     Electronically Signed   By: Delanna Ahmadi M.D.   On: 07/21/2022 11:16       IMMUNOLOGY   Latest Reference Range & Units 03/02/22 13:38  Anti Nuclear Antibody (ANA) NEGATIVE  NEGATIVE  ANCA SCREEN Negative  Negative  Cyclic Citrullin Peptide Ab UNITS <16  ds DNA Ab IU/mL <1  Myeloperoxidase Abs <1.0 AI <1.0  Serine Protease 3 <1.0 AI <1.0  RA Latex Turbid. 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    reports that she quit smoking about 50 years ago. Her smoking use included cigarettes. She has a 2.00 pack-year smoking history.  She has been exposed to tobacco smoke. She has never used smokeless tobacco.  Past Surgical History:  Procedure Laterality Date   ANKLE FRACTURE SURGERY Left    COLONOSCOPY     ESOPHAGEAL MANOMETRY N/A 12/29/2021   Procedure: ESOPHAGEAL MANOMETRY (EM);  Surgeon: Doran Stabler, MD;  Location: [facility];  Service: Gastroenterology;  Laterality: N/A;  pt will stay on her PPI   ESOPHAGOGASTRODUODENOSCOPY N/A 02/24/2022   Procedure: ESOPHAGOGASTRODUODENOSCOPY (EGD);  Surgeon: Lajuana Matte, MD;  Location: [facility];  Service: Thoracic;  Laterality: N/A;   POLYPECTOMY     TOTAL HIP ARTHROPLASTY Left 01/06/2019   Procedure: TOTAL HIP ARTHROPLASTY ANTERIOR APPROACH;  Surgeon: Dorna Leitz, MD;  Location: [facility];  Service: Orthopedics;  Laterality: Left;   TUBAL LIGATION     XI ROBOTIC ASSISTED HIATAL HERNIA REPAIR N/A 02/24/2022   Procedure: XI ROBOTIC ASSISTED HIATAL HERNIA REPAIR with Fundoplication using Myriad Matrix;  Surgeon: Lajuana Matte, MD;  Location: [facility] OR;  Service: Thoracic;  Laterality: N/A;    Allergies  Allergen Reactions   Actonel [Risedronate]     Severe chest pain   Alendronate Other (See Comments)    Severe chest pain   Boniva [Ibandronic Acid]     Chest pain    Immunization History  Administered Date(s) Administered   Influenza Split 11/02/2009, 09/02/2010, 11/15/2011, 09/25/2012, 11/25/2013, 09/30/2014   Influenza, High Dose Seasonal PF 08/16/2017   Influenza, Quadrivalent, Recombinant, Inj, Pf 09/08/2018, 09/12/2019, 10/07/2020, 10/06/2021   Influenza,inj,Quad PF,6+ Mos 08/20/2015   PFIZER(Purple Top)SARS-COV-2 Vaccination 12/28/2019, 01/18/2020, 08/07/2020   Pneumococcal Conjugate-13 11/25/2013   Tdap 10/15/2007   Zoster Recombinat (Shingrix) 03/01/2018, 06/17/2018   Zoster, Live 11/02/2009, 04/16/2013    Family  History  Problem Relation Age of Onset   Heart disease Father    Liver cancer Paternal Aunt    Prostate cancer Paternal Grandfather    Colon cancer Neg Hx    Stomach cancer Neg Hx    Pancreatic cancer Neg Hx    Esophageal cancer Neg Hx    Rectal cancer Neg Hx    Colon polyps Neg Hx    Crohn's disease Neg Hx      Current Outpatient Medications:    acetaminophen (TYLENOL) 325 MG tablet, Take 2 tablets (650 mg total) by mouth every 6 (six) hours as needed for mild pain., Disp: , Rfl:    ALPRAZolam (XANAX) 0.5 MG tablet, Take 0.25 mg by mouth at bedtime as needed for anxiety or sleep., Disp: , Rfl:    baclofen (LIORESAL) 10 MG tablet, TAKE 1 TABLET BY MOUTH AT 8 AM  AND 2 PM AS NEEDED, Disp: 60 tablet, Rfl: 0   carboxymethylcellulose (REFRESH PLUS) 0.5 % SOLN, Place 1 drop into both eyes 3 (three) times daily as needed (dry eyes)., Disp: , Rfl:    Cholecalciferol 50 MCG (2000 UT) CAPS, Take 2,000 Units by mouth daily., Disp: , Rfl:    denosumab (PROLIA) 60 MG/ML SOSY injection, Inject 60 mg into the skin every 6 (six) months., Disp: , Rfl:    diclofenac Sodium (VOLTAREN) 1 % GEL, Apply 1 application. topically daily as needed (pain)., Disp: , Rfl:    fluticasone (FLONASE) 50 MCG/ACT nasal spray, Place 1 spray into both nostrils daily., Disp: , Rfl:    hyoscyamine (LEVSIN) 0.125 MG tablet, Take 0.125 mg by mouth every 4 (four) hours as needed for bladder spasms or cramping., Disp: , Rfl:    loratadine (CLARITIN) 10 MG tablet, Take 10 mg  by mouth daily as needed for allergies., Disp: , Rfl:    Multiple Vitamin (MULTI-VITAMIN DAILY PO), Take 1 tablet by mouth daily., Disp: , Rfl:    Olopatadine HCl 0.2 % SOLN, Place 1 drop into both eyes daily., Disp: , Rfl:    venlafaxine XR (EFFEXOR-XR) 150 MG 24 hr capsule, Take 150 mg by mouth daily with breakfast., Disp: , Rfl:       Objective:   Vitals:   08/18/22 0949  BP: 122/70  Pulse: (!) 53  Temp: 97.9 F (36.6 C)  TempSrc: Oral  SpO2:  99%  Weight: 152 lb (68.9 kg)  Height: 5' 2.5" (1.588 m)    Estimated body mass index is 27.36 kg/m as calculated from the following:   Height as of this encounter: 5' 2.5" (1.588 m).   Weight as of this encounter: 152 lb (68.9 kg).  '@WEIGHTCHANGE' @  Filed Weights   08/18/22 0949  Weight: 152 lb (68.9 kg)     Physical Exam    General: No distress. Looks well. Overweight. FLast affect Neuro: Alert and Oriented x 3. GCS 15. Speech normal Psych: Pleasant Resp:  Barrel Chest - no.  Wheeze - no, Crackles - ? bast, No overt respiratory distress CVS: Normal heart sounds. Murmurs - no Ext: Stigmata of Connective Tissue Disease - no HEENT: Normal upper airway. PEERL +. No post nasal drip        Assessment:       ICD-10-CM   1. ILD (interstitial lung disease) (HCC)  J84.9 Pulmonary function test    2. Hiatal hernia  K44.9     3. Status post Nissen fundoplication  Z61.096     4. History of Raynaud's syndrome  Z86.79     5. Sniffles  R09.89          Plan:     Patient Instructions  ILD (interstitial lung disease) ([place]) Hiatal hernia Status post Nissen fundoplication History of Raynaud's syndrome   - mild/early ILD present - unclear if this is IPF v non-IPF;  - GERD is a MAJOR risk factor and surgery helps stabilize lungs - doubt morphea and raynaud are risk facotrs for you  -if you hve feather related pillow or sofar it is a risk factor - forgot to check with you last time  plan - given recent surgery will hold off on any lung biopsy at this point in time - instead will monitor closely   - if progresses need to consider anti-fibrotic - pirfenidone v nintedanib - will need to nesure GERD is under full good control   - will message D [facility] to see if we need to do redp pH probe study - repeat spirometry and dlco in 3 months  - get RSV, flu and covid vaccines in fall 2023 - encourage weight loss   - talk to PCP about ozempic; indicated in BMI >= 27 which you  are - refer exercise program if you are interested  - pulmonary rehab -get rid of bird and feather pillow and sofa If you hae it at home   [place]  - ensure you get covid testing at home   Followup - 3 months but after spirome /dlco  - symptoms score and walk test at followu    SIGNATURE    Dr. Brand Males, M.D., F.C.C.P,  Pulmonary and Critical Care Medicine Staff Physician, [place] Director - Interstitial Lung Disease  Program  [facility] [place] [facility] [place]  [facility] [place], [place], [address]  Pager: [phone], If no answer or between  15:00h - 7:00h: call [phone] Telephone: [phone]  10:40 AM 08/18/2022

## 2022-08-18 NOTE — Telephone Encounter (Signed)
Hi Heather Lindsey is our mutual patient.  She has early ILD.  I am glad she had Nissen fundoplication and improved control of acid reflux.  This actually can help stabilize the risk for progression of her ILD.  Having said that I am wondering if she still has some residual acid reflux and if pH probe study would be indicated.  This is because she is not on any PPI at the moment.  Thank you for consideration.  Please do not hesitate to ask me any questions at any time.    SIGNATURE    Dr. Brand Males, M.D., F.C.C.P,  Pulmonary and Critical Care Medicine Staff Physician, [facility] Director - Interstitial Lung Disease  Program  Medical Director - [place] [facility] Pul[facility] [place] at [place], [place], [address]  NPI Number:  NPI #[id number] [facility] Number: [id number]  Pager: [phone], If no answer  -[place] or [facility] [place] Telephone (clinical office): [phone] Telephone (research): [phone]  10:34 AM 08/18/2022

## 2022-09-13 ENCOUNTER — Ambulatory Visit: Payer: Medicare Other | Admitting: Gastroenterology

## 2022-09-13 ENCOUNTER — Encounter: Payer: Self-pay | Admitting: Gastroenterology

## 2022-09-13 VITALS — BP 130/72 | HR 81 | Ht 63.0 in | Wt 153.0 lb

## 2022-09-13 DIAGNOSIS — K2100 Gastro-esophageal reflux disease with esophagitis, without bleeding: Secondary | ICD-10-CM | POA: Diagnosis not present

## 2022-09-13 DIAGNOSIS — K449 Diaphragmatic hernia without obstruction or gangrene: Secondary | ICD-10-CM

## 2022-09-13 DIAGNOSIS — K648 Other hemorrhoids: Secondary | ICD-10-CM | POA: Diagnosis not present

## 2022-09-13 NOTE — Patient Instructions (Signed)
_______________________________________________________  If you are age 75 or older, your body mass index should be between 23-30. Your Body mass index is 27.1 kg/m. If this is out of the aforementioned range listed, please consider follow up with your Primary Care Provider.  If you are age 71 or younger, your body mass index should be between 19-25. Your Body mass index is 27.1 kg/m. If this is out of the aformentioned range listed, please consider follow up with your Primary Care Provider.   ________________________________________________________  The [place] [facility] providers would like to encourage you to use [facility] to communicate with providers for non-urgent requests or questions.  Due to long hold times on the telephone, sending your provider a message by [facility] may be a faster and more efficient way to get a response.  Please allow 48 business hours for a response.  Please remember that this is for non-urgent requests.  _______________________________________________________,  HEMORRHOID BANDING PROCEDURE    FOLLOW-UP CARE   The procedure you have had should have been relatively painless since the banding of the area involved does not have nerve endings and there is no pain sensation.  The rubber band cuts off the blood supply to the hemorrhoid and the band may fall off as soon as 48 hours after the banding (the band may occasionally be seen in the toilet bowl following a bowel movement). You may notice a temporary feeling of fullness in the rectum which should respond adequately to plain Tylenol or Motrin.  Following the banding, avoid strenuous exercise that evening and resume full activity the next day.  A sitz bath (soaking in a warm tub) or bidet is soothing, and can be useful for cleansing the area after bowel movements.     To avoid constipation, take two tablespoons of natural wheat bran, natural oat bran, flax, Benefiber or any over the counter fiber supplement and increase  your water intake to 7-8 glasses daily.    Unless you have been prescribed anorectal medication, do not put anything inside your rectum for two weeks: No suppositories, enemas, fingers, etc.  Occasionally, you may have more bleeding than usual after the banding procedure.  This is often from the untreated hemorrhoids rather than the treated one.  Don't be concerned if there is a tablespoon or so of blood.  If there is more blood than this, lie flat with your bottom higher than your head and apply an ice pack to the area. If the bleeding does not stop within a half an hour or if you feel faint, call our office at [phone] or go to the emergency room.  Problems are not common; however, if there is a substantial amount of bleeding, severe pain, chills, fever or difficulty passing urine (very rare) or other problems, you should call us at [phone] or report to the nearest emergency room.  Do not stay seated continuously for more than 2-3 hours for a day or two after the procedure.  Tighten your buttock muscles 10-15 times every two hours and take 10-15 deep breaths every 1-2 hours.  Do not spend more than a few minutes on the toilet if you cannot empty your bowel; instead re-visit the toilet at a later time.    It was a pleasure to see you today!  Thank you for trusting me with your gastrointestinal care!

## 2022-09-13 NOTE — Progress Notes (Signed)
[facility] Progress Note  Chief Complaint: Internal hemorrhoids and reflux  Subjective  History:  Heather Lindsey was primarily here today for hemorrhoidal banding due to frequent hemorrhoidal bleeding (see colonoscopy report for findings). Her pulmonologist also requested reevaluation of her underlying reflux with concerns that may have significantly contributed to her ILD.  ROS: Cardiovascular:  no chest pain Respiratory: no dyspnea She denies cough, heartburn or regurgitation ever since the surgery. The patient's Past Medical, Family and Social History were reviewed and are on file in the EMR. Pulmonary clinic note 08/18/2022 discussion of ILD notes that prior to her reflux surgery Heather Lindsey was having cough and heartburn but that both symptoms have resolved.  Her pulmonologist wondered if she needed repeat pH testing to ensure good control of the reflux.   Objective:  Med list reviewed  Current Outpatient Medications:    acetaminophen (TYLENOL) 325 MG tablet, Take 2 tablets (650 mg total) by mouth every 6 (six) hours as needed for mild pain., Disp: , Rfl:    ALPRAZolam (XANAX) 0.5 MG tablet, Take 0.25 mg by mouth at bedtime as needed for anxiety or sleep., Disp: , Rfl:    baclofen (LIORESAL) 10 MG tablet, TAKE 1 TABLET BY MOUTH AT 8 AM  AND 2 PM AS NEEDED, Disp: 60 tablet, Rfl: 0   carboxymethylcellulose (REFRESH PLUS) 0.5 % SOLN, Place 1 drop into both eyes 3 (three) times daily as needed (dry eyes)., Disp: , Rfl:    Cholecalciferol 50 MCG (2000 UT) CAPS, Take 2,000 Units by mouth daily., Disp: , Rfl:    denosumab (PROLIA) 60 MG/ML SOSY injection, Inject 60 mg into the skin every 6 (six) months., Disp: , Rfl:    diclofenac Sodium (VOLTAREN) 1 % GEL, Apply 1 application. topically daily as needed (pain)., Disp: , Rfl:    fluticasone (FLONASE) 50 MCG/ACT nasal spray, Place 1 spray into both nostrils daily., Disp: , Rfl:    hyoscyamine (LEVSIN) 0.125 MG tablet, Take 0.125 mg by mouth  every 4 (four) hours as needed for bladder spasms or cramping., Disp: , Rfl:    loratadine (CLARITIN) 10 MG tablet, Take 10 mg by mouth daily as needed for allergies., Disp: , Rfl:    Multiple Vitamin (MULTI-VITAMIN DAILY PO), Take 1 tablet by mouth daily., Disp: , Rfl:    Olopatadine HCl 0.2 % SOLN, Place 1 drop into both eyes daily., Disp: , Rfl:    venlafaxine XR (EFFEXOR-XR) 150 MG 24 hr capsule, Take 150 mg by mouth daily with breakfast., Disp: , Rfl:    Vital signs in last 24 hrs: Vitals:   09/13/22 0903  BP: 130/72  Pulse: 81   Wt Readings from Last 3 Encounters:  09/13/22 153 lb (69.4 kg)  08/18/22 152 lb (68.9 kg)  06/30/22 153 lb 6.4 oz (69.6 kg)    Physical Exam  Well-appearing  Cardiac: Regular without murmur,  no peripheral edema Pulm: clear to auscultation bilaterally, normal RR and effort noted Abdomen: soft, soft and nontender, with active bowel sounds. No guarding or palpable hepatosplenomegaly.   Labs:   ___________________________________________ Radiologic studies:  Narrative & Impression CLINICAL DATA:  Post Nissen fundoplication   EXAM: ESOPHOGRAM/BARIUM SWALLOW   TECHNIQUE: Single contrast examination was performed using water soluble Omnipaque 300.   FLUOROSCOPY: Radiation Exposure Index (as provided by the fluoroscopic device): 3.1 mGy mGy Kerma   COMPARISON:  None.   FINDINGS: Patient swallowed contrast without difficulty. There is narrowing at the gastroesophageal junction with mild holdup of  contrast passage. No hiatal hernia. No apparent gastroesophageal reflux. No evidence of contrast extravasation.   IMPRESSION: Narrowing at the gastroesophageal junction with mild holdup of contrast likely reflecting expected changes of fundoplication with postoperative edema. No leak.     Electronically Signed   By: Macy Mis M.D.   On: 02/25/2022  08:59  ____________________________________________ Other:   _____________________________________________ Assessment & Plan  Assessment: Encounter Diagnoses  Name Primary?   Gastroesophageal reflux disease with esophagitis without hemorrhage Yes   Hiatal hernia    Bleeding internal hemorrhoids    She has no residual reflux symptoms after the surgical hernia repair and fundoplication, and does not need further reflux related testing at this point.  She will let me know if she develops recurrent symptoms.  After that discussion she then had hemorrhoidal banding.    _____________________________ PROCEDURE NOTE: The patient presents with symptomatic grade 2  hemorrhoids, requesting rubber band ligation of his/her hemorrhoidal disease.  All risks, benefits and alternative forms of therapy were described and informed consent was obtained.  DRE revealed: Normal   The anorectum was pre-medicated with 0.125% NTG and lubricant. The decision was made to band the RP internal hemorrhoids, and the [place] was used to perform band ligation without complication.  Digital anorectal examination was then performed to assure proper positioning of the band, and to adjust the banded tissue as required.  The patient was discharged home without pain or other issues.  Dietary and behavioral recommendations were given and along with follow-up instructions.     The following adjunctive treatments were recommended:  None  The patient will return 2 to 3 weeks for  follow-up and possible additional banding as required. No complications were encountered and the patient tolerated the procedure well.  Wilfrid Lund, MD

## 2022-09-18 IMAGING — CR DG CHEST 2V
2 series · 2 of 2 positions shown · non-contrast
Comparison: Chest x-ray dated February 22, 2022

CLINICAL DATA: Hiatal hernia

EXAM:
CHEST - 2 VIEW

[w chest pa]
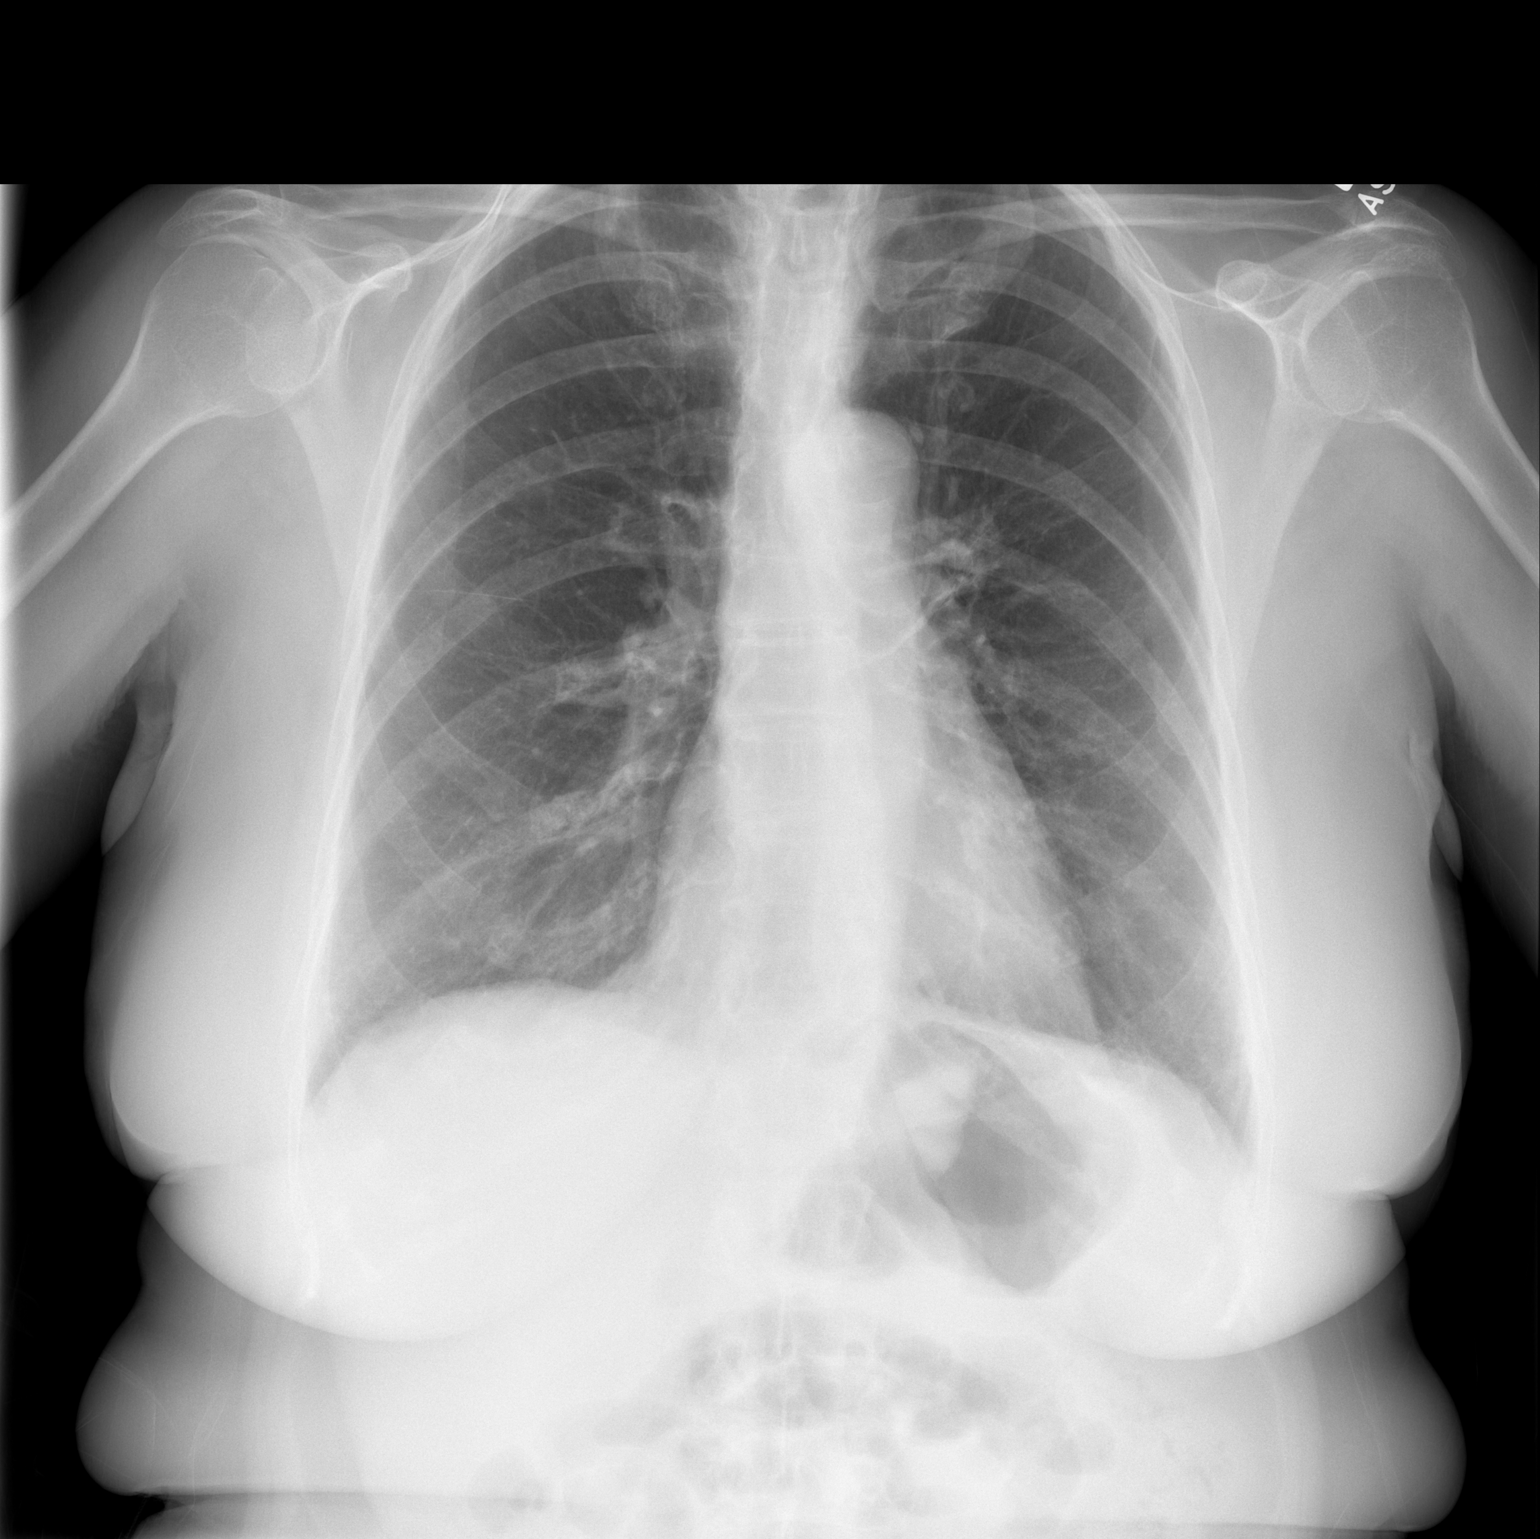

[w chest lat]
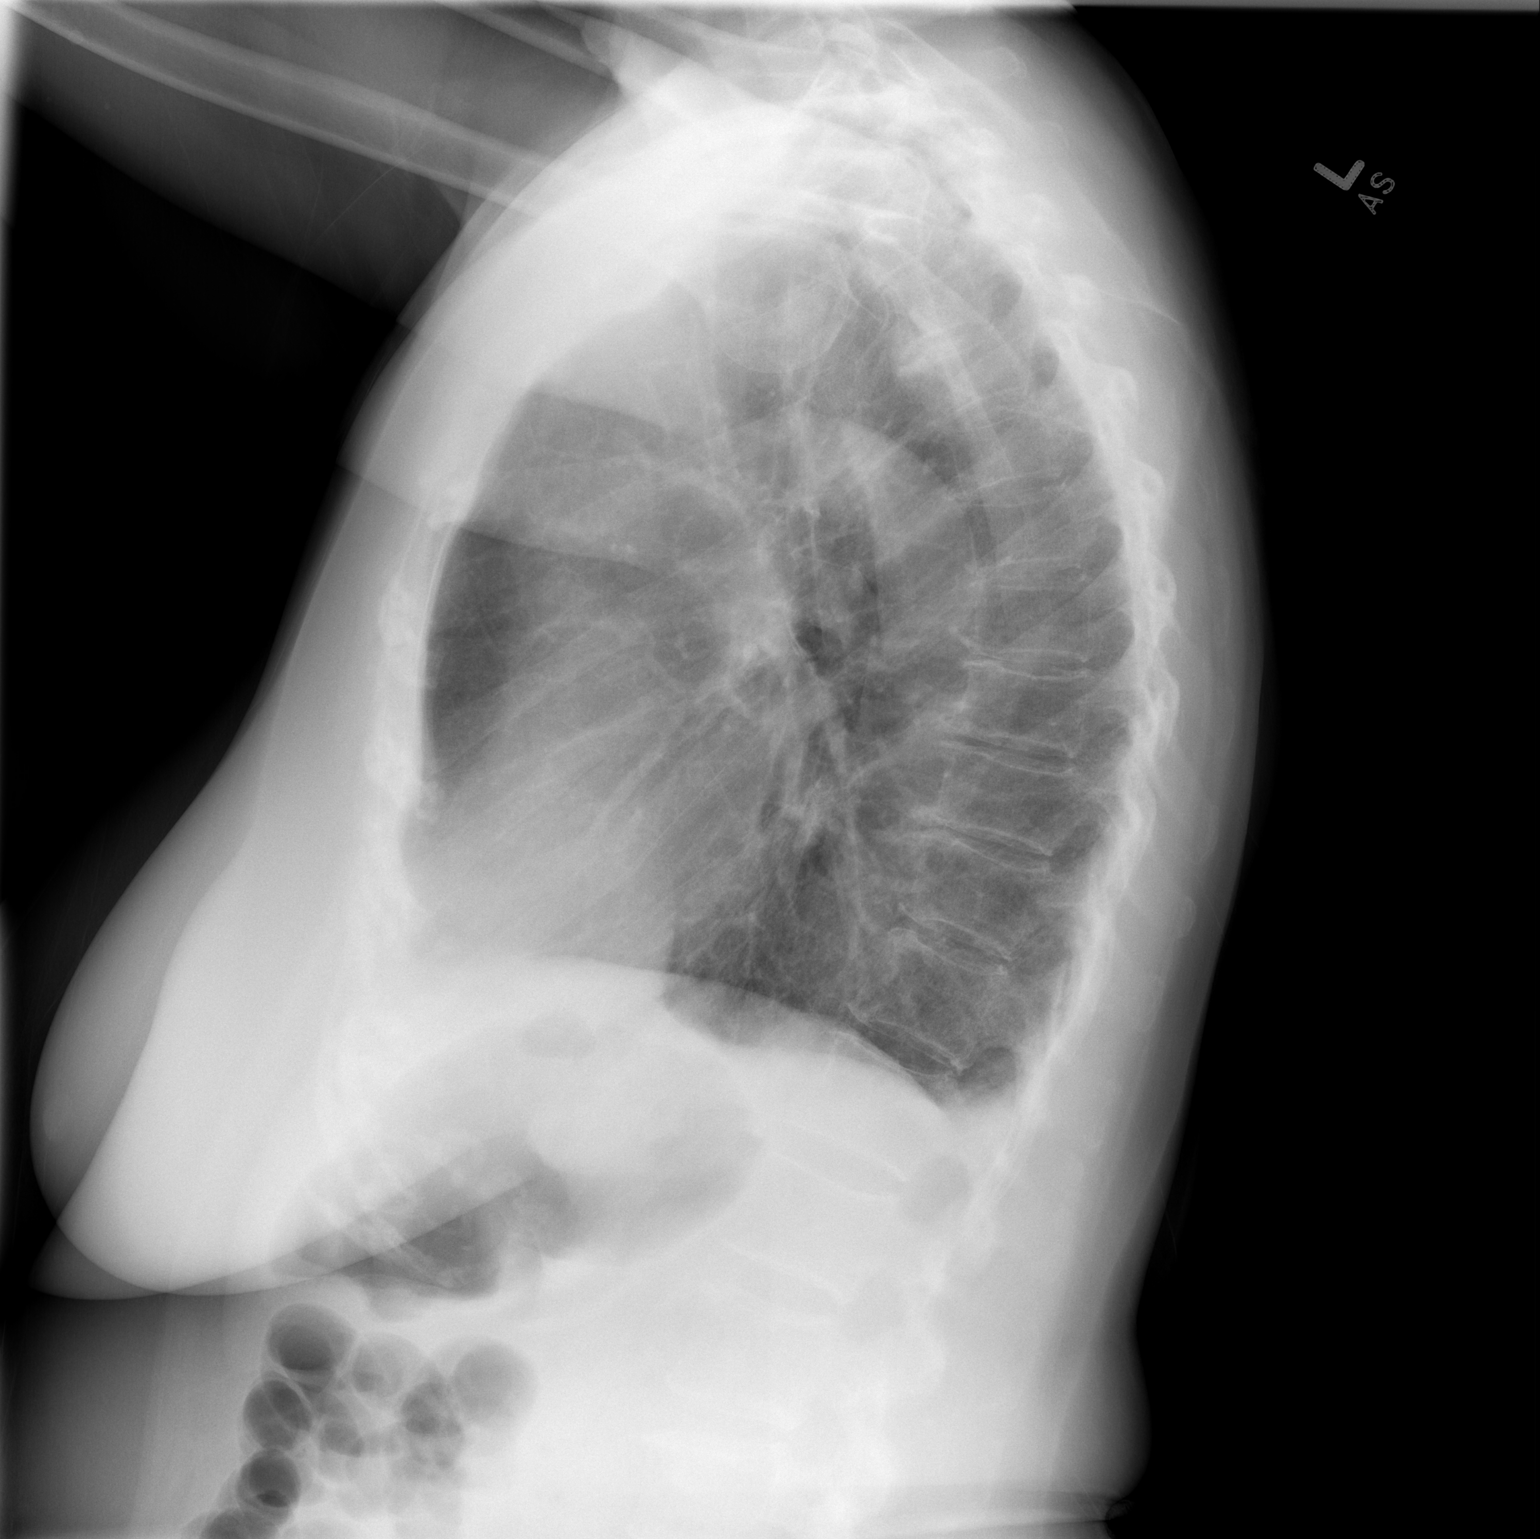

[2 of 2 positions shown; findings below may reference images not displayed]

FINDINGS: The heart size and mediastinal contours are within normal limits. No
visible hiatal hernia. Blunting of the costophrenic angles on
lateral view, favored to be due to bochdalek hernias when compared
with prior CT. Mild bibasilar atelectasis. Lungs otherwise clear.
The visualized skeletal structures are unremarkable.
IMPRESSION: 1. No active cardiopulmonary disease.
2. No radiographic evidence of hiatal hernia.

## 2022-10-25 ENCOUNTER — Telehealth: Payer: Self-pay | Admitting: *Deleted

## 2022-10-25 NOTE — Telephone Encounter (Signed)
Labs received from:[facility]  Drawn on:10/14/2022  Reviewed by:Hazel Sams, PA-C  Labs drawn:CBC, CMP, Lipid Panel, Hgb A1C  Results: RBC 4.0    HCT 34.9    MCH 32.1    RDW 12.2    Cholesterol 274    HDL 69  Patient is on Baclofen 10 mg po BID PRN.

## 2022-10-26 ENCOUNTER — Ambulatory Visit: Payer: Medicare Other | Admitting: Gastroenterology

## 2022-10-26 ENCOUNTER — Encounter: Payer: Self-pay | Admitting: Gastroenterology

## 2022-10-26 VITALS — BP 116/80 | HR 79 | Ht 63.0 in | Wt 151.0 lb

## 2022-10-26 DIAGNOSIS — K648 Other hemorrhoids: Secondary | ICD-10-CM

## 2022-10-26 NOTE — Progress Notes (Signed)
PROCEDURE NOTE: The patient presents with symptomatic grade 2  hemorrhoids, requesting rubber band ligation of his/her hemorrhoidal disease.  All risks, benefits and alternative forms of therapy were described and informed consent was obtained.  DRE revealed: nml   The anorectum was pre-medicated with 0.125% NTG and lubricant. The decision was made to band the RA internal hemorrhoids, and the [facility] System was used to perform band ligation without complication.  Digital anorectal examination was then performed to assure proper positioning of the band, and to adjust the banded tissue as required.  The patient was discharged home without pain or other issues.  Dietary and behavioral recommendations were given and along with follow-up instructions.     The following adjunctive treatments were recommended:  none  The patient will return prn  for  follow-up and possible additional banding as required. No complications were encountered and the patient tolerated the procedure well.  - Rodnesha Elie Danis, MD  

## 2022-10-26 NOTE — Patient Instructions (Signed)
_______________________________________________________  If you are age 75 or older, your body mass index should be between 23-30. Your Body mass index is 26.75 kg/m. If this is out of the aforementioned range listed, please consider follow up with your Primary Care Provider.  If you are age 52 or younger, your body mass index should be between 19-25. Your Body mass index is 26.75 kg/m. If this is out of the aformentioned range listed, please consider follow up with your Primary Care Provider.   ________________________________________________________  The [facility] providers would like to encourage you to use [facility] to communicate with providers for non-urgent requests or questions.  Due to long hold times on the telephone, sending your provider a message by [facility] may be a faster and more efficient way to get a response.  Please allow 48 business hours for a response.  Please remember that this is for non-urgent requests.  _______________________________________________________  Heather Lindsey PROCEDURE    FOLLOW-UP CARE   The procedure you have had should have been relatively painless since the banding of the area involved does not have nerve endings and there is no pain sensation.  The rubber band cuts off the blood supply to the hemorrhoid and the band may fall off as soon as 48 hours after the banding (the band may occasionally be seen in the toilet bowl following a bowel movement). You may notice a temporary feeling of fullness in the rectum which should respond adequately to plain Tylenol or Motrin.  Following the banding, avoid strenuous exercise that evening and resume full activity the next day.  A sitz bath (soaking in a warm tub) or bidet is soothing, and can be useful for cleansing the area after bowel movements.     To avoid constipation, take two tablespoons of natural wheat bran, natural oat bran, flax, Benefiber or any over the counter fiber supplement and increase  your water intake to 7-8 glasses daily.    Unless you have been prescribed anorectal medication, do not put anything inside your rectum for two weeks: No suppositories, enemas, fingers, etc.  Occasionally, you may have more bleeding than usual after the banding procedure.  This is often from the untreated hemorrhoids rather than the treated one.  Don't be concerned if there is a tablespoon or so of blood.  If there is more blood than this, lie flat with your bottom higher than your head and apply an ice pack to the area. If the bleeding does not stop within a half an hour or if you feel faint, call our office at [phone] or go to the emergency room.  Problems are not common; however, if there is a substantial amount of bleeding, severe pain, chills, fever or difficulty passing urine (very rare) or other problems, you should call us at [phone] or report to the nearest emergency room.  Do not stay seated continuously for more than 2-3 hours for a day or two after the procedure.  Tighten your buttock muscles 10-15 times every two hours and take 10-15 deep breaths every 1-2 hours.  Do not spend more than a few minutes on the toilet if you cannot empty your bowel; instead re-visit the toilet at a later time.   It was a pleasure to see you today!  Thank you for trusting me with your gastrointestinal care!

## 2022-11-14 ENCOUNTER — Telehealth: Payer: Self-pay | Admitting: Internal Medicine

## 2022-11-14 NOTE — Telephone Encounter (Signed)
Attempted to call pt but unable to reach. Left message for her to return call. 

## 2022-11-14 NOTE — Telephone Encounter (Signed)
Keep it. There is a 50% chance she gets covid. On morning of visit and PFT 12/7 - if she is feeling well -+ she should do an antigetn tets ->if this is negative she can come do PFT and vist -> otherwise reschedule. If she is nervous about this algorithm - she can just postpone the whole thing to Jan/Fev

## 2022-11-14 NOTE — Telephone Encounter (Signed)
Noted. Nothing further needed. 

## 2022-11-14 NOTE — Telephone Encounter (Signed)
Pt called the office about her upcoming appts scheduled 12/7. Pt said that her spouse tested positive for covid 12/4.  Pt has taken a covid test and so far she is not testing positive. Pt does not have any symptoms. Pt said that her spouse is beginning the antiviral to help treat the covid.  Due to her spouse testing positive for covid, pt wants to know if it would be okay for her to keep her upcoming appts including PFT and OV or if she should reschedule.  MR, please advise.

## 2022-11-17 ENCOUNTER — Ambulatory Visit: Payer: Medicare Other | Admitting: Internal Medicine

## 2022-11-17 ENCOUNTER — Ambulatory Visit (INDEPENDENT_AMBULATORY_CARE_PROVIDER_SITE_OTHER): Payer: Medicare Other | Admitting: Internal Medicine

## 2022-11-17 ENCOUNTER — Encounter: Payer: Self-pay | Admitting: Internal Medicine

## 2022-11-17 VITALS — BP 116/70 | HR 86 | Ht 63.0 in | Wt 155.6 lb

## 2022-11-17 DIAGNOSIS — J849 Interstitial pulmonary disease, unspecified: Secondary | ICD-10-CM

## 2022-11-17 DIAGNOSIS — Z8679 Personal history of other diseases of the circulatory system: Secondary | ICD-10-CM

## 2022-11-17 DIAGNOSIS — Z9889 Other specified postprocedural states: Secondary | ICD-10-CM

## 2022-11-17 DIAGNOSIS — Z7185 Encounter for immunization safety counseling: Secondary | ICD-10-CM

## 2022-11-17 DIAGNOSIS — K449 Diaphragmatic hernia without obstruction or gangrene: Secondary | ICD-10-CM

## 2022-11-17 LAB — PULMONARY FUNCTION TEST
DL/VA % pred: 74 %
DL/VA: 3.08 ml/min/mmHg/L
DLCO cor % pred: 78 %
DLCO cor: 14.26 ml/min/mmHg
DLCO unc % pred: 78 %
DLCO unc: 14.26 ml/min/mmHg
FEF 25-75 Pre: 2.85 L/sec
FEF2575-%Pred-Pre: 181 %
FEV1-%Pred-Pre: 124 %
FEV1-Pre: 2.46 L
FEV1FVC-%Pred-Pre: 112 %
FEV6-%Pred-Pre: 116 %
FEV6-Pre: 2.92 L
FEV6FVC-%Pred-Pre: 105 %
FVC-%Pred-Pre: 111 %
FVC-Pre: 2.93 L
Pre FEV1/FVC ratio: 84 %
Pre FEV6/FVC Ratio: 100 %

## 2022-11-17 NOTE — Progress Notes (Signed)
Spirometry and Dlco done today. 

## 2022-11-17 NOTE — Patient Instructions (Addendum)
ILD (interstitial lung disease) ([place]) Hiatal hernia Status post Nissen fundoplication History of Raynaud's syndrome   - mild/early ILD since feb 2023 in setting of hiatal hernia and GERD; - unclear if this is IPF v non-IPF;   - -s/p Nissen in March 2023 nd glad you got rid of feather pillos - currently 11/17/2022 - stable v PFT improved  plan - given stability (and controlling risk - gerd and feather pillow)  will monitor you closely instead of biopsy or anti-fibrotic -Consider getting Pneumovax on your own [we do not have stock] -  47month do spiomtery and dlco    Followup - 6 months but after spirome /dlco  - symptoms score and walk test at followup  - can decide HRCT followup at time of next visit

## 2022-11-17 NOTE — Progress Notes (Deleted)
Office Visit Note  Patient: Heather Lindsey             Date of Birth: 12-08-1946           MRN: [id number]             PCP: Prince Solian, MD Referring: Prince Solian, MD Visit Date: 11/29/2022 Occupation: _0 @  Subjective:  No chief complaint on file.   History of Present Illness: Heather Lindsey is a 76 y.o. female ***   Activities of Daily Living:  Patient reports morning stiffness for *** {minute/hour:19697}.   Patient {ACTIONS;DENIES/REPORTS:21021675::"Denies"} nocturnal pain.  Difficulty dressing/grooming: {ACTIONS;DENIES/REPORTS:21021675::"Denies"} Difficulty climbing stairs: {ACTIONS;DENIES/REPORTS:21021675::"Denies"} Difficulty getting out of chair: {ACTIONS;DENIES/REPORTS:21021675::"Denies"} Difficulty using hands for taps, buttons, cutlery, and/or writing: {ACTIONS;DENIES/REPORTS:21021675::"Denies"}  No Rheumatology ROS completed.   PMFS History:  Patient Active Problem List   Diagnosis Date Noted  . Hiatal hernia 02/24/2022  . Heartburn   . Cough 01/06/2019  . Anxiety 01/05/2019  . Left displaced femoral neck fracture ([place]) 01/05/2019  . Hyponatremia 01/05/2019  . Raynaud's phenomenon without gangrene 10/25/2016  . Trochanteric bursitis of both hips 10/25/2016  . Gastroesophageal reflux disease 10/25/2016  . Former smoker, 10/25/2016  . 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 HYPERCHOLESTEROLEMIA 11/18/2008  . Depression 11/18/2008  . IRRITABLE BOWEL SYNDROME 11/18/2008  . Morphea 11/18/2008  . Primary osteoarthritis of both hands 11/18/2008  . Age related osteoporosis 11/18/2008  . MIGRAINES, HX OF 11/18/2008    Past Medical History:  Diagnosis Date  . Allergy    SEASONAL  . Anxiety   . 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 Brain bleed ([place])   . Broken wrist    LEFT,2006  . Cataract     BILATERAL NO SURGERY YET UPDATED 06/09/22  . Circumscribed scleroderma   . Depression   . Diverticulosis   . GERD (gastroesophageal reflux disease)   . Hyperlipidemia   . IBS (irritable bowel syndrome)   . Memory loss   . Migraines   . Osteoarthritis   . Osteoporosis   . Stroke [facility]) 2015    Family History  Problem Relation Age of Onset  . Heart disease Father   . Liver cancer Paternal Aunt   . Prostate cancer Paternal Grandfather   . Colon cancer Neg Hx   . Stomach cancer Neg Hx   . Pancreatic cancer Neg Hx   . Esophageal cancer Neg Hx   . Rectal cancer Neg Hx   . Colon polyps Neg Hx   . Crohn's disease Neg Hx    Past Surgical History:  Procedure Laterality Date  . ANKLE FRACTURE SURGERY Left   . COLONOSCOPY    . ESOPHAGEAL MANOMETRY N/A 12/29/2021   Procedure: ESOPHAGEAL MANOMETRY (EM);  Surgeon: Doran Stabler, MD;  Location: [facility] ENDOSCOPY;  Service: Gastroenterology;  Laterality: N/A;  pt will stay on her PPI  . ESOPHAGOGASTRODUODENOSCOPY N/A 02/24/2022   Procedure: ESOPHAGOGASTRODUODENOSCOPY (EGD);  Surgeon: Lajuana Matte, MD;  Location: [facility] OR;  Service: Thoracic;  Laterality: N/A;  . POLYPECTOMY    . TOTAL HIP ARTHROPLASTY Left 01/06/2019   Procedure: TOTAL HIP ARTHROPLASTY ANTERIOR APPROACH;  Surgeon: Dorna Leitz, MD;  Location: [facility] ORS;  Service: Orthopedics;  Laterality: Left;  . TUBAL LIGATION    . XI ROBOTIC ASSISTED HIATAL HERNIA REPAIR N/A 02/24/2022   Procedure: XI ROBOTIC ASSISTED HIATAL HERNIA REPAIR with Fundoplication using  Myriad Matrix;  Surgeon: Lajuana Matte, MD;  Location: [facility] OR;  Service: Thoracic;  Laterality: N/A;   Social History   Social History Narrative   Daily caffeine, 2 glasses daily,   Right handed, Married, 2 sons. Retired. College.   Immunization History  Administered Date(s) Administered  . Influenza Split 11/02/2009, 09/02/2010, 11/15/2011, 09/25/2012, 11/25/2013, 09/30/2014  . Influenza, High Dose Seasonal PF  08/16/2017  . Influenza, Quadrivalent, Recombinant, Inj, Pf 09/08/2018, 09/12/2019, 10/07/2020, 10/06/2021  . Influenza,inj,Quad PF,6+ Mos 08/20/2015  . PFIZER(Purple Top)SARS-COV-2 Vaccination 12/28/2019, 01/18/2020, 08/07/2020  . Pneumococcal Conjugate-13 11/25/2013  . Tdap 10/15/2007  . Zoster Recombinat (Shingrix) 03/01/2018, 06/17/2018  . Zoster, Live 11/02/2009, 04/16/2013     Objective: Vital Signs: There were no vitals taken for this visit.   Physical Exam   Musculoskeletal Exam: ***  CDAI Exam: CDAI Score: -- Patient Global: --; Provider Global: -- Swollen: --; Tender: -- Joint Exam 11/29/2022   No joint exam has been documented for this visit   There is currently no information documented on the homunculus. Go to the Rheumatology activity and complete the homunculus joint exam.  Investigation: No additional findings.  Imaging: No results found.  Recent Labs: Lab Results  Component Value Date   WBC 11.2 (H) 02/25/2022   HGB 11.0 (L) 02/25/2022   PLT 218 02/25/2022   NA 135 02/25/2022   K 4.0 02/25/2022   CL 100 02/25/2022   CO2 25 02/25/2022   GLUCOSE 114 (H) 02/25/2022   BUN 8 02/25/2022   CREATININE 0.87 02/25/2022   BILITOT 0.4 02/22/2022   ALKPHOS 57 02/22/2022   AST 19 02/22/2022   ALT 15 02/22/2022   PROT 7.1 02/22/2022   ALBUMIN 3.6 02/22/2022   CALCIUM 8.3 (L) 02/25/2022   GFRAA 71 03/18/2021    Speciality Comments: No specialty comments available.  Procedures:  No procedures performed Allergies: Actonel [risedronate], Alendronate, and Boniva [ibandronic acid]   Assessment / Plan:     Visit Diagnoses: No diagnosis found.  Orders: No orders of the defined types were placed in this encounter.  No orders of the defined types were placed in this encounter.   Face-to-face time spent with patient was *** minutes. Greater than 50% of time was spent in counseling and coordination of care.  Follow-Up Instructions: No follow-ups on  file.   Earnestine Mealing, CMA  Note - This record has been created using Editor, commissioning.  Chart creation errors have been sought, but may not always  have been located. Such creation errors do not reflect on  the standard of medical care.

## 2022-11-17 NOTE — Progress Notes (Addendum)
OV 03/17/2022  Subjective:  Patient ID: Heather Lindsey, female , DOB: 1946/02/02 , age 76 y.o. , MRN: [id number] , ADDRESS: [place] [address] PCP Prince Solian, MD Patient Care Team: Prince Solian, MD as PCP - General (Internal Medicine)  This Provider for this visit: Treatment Team:  Attending Provider: Brand Males, MD    03/17/2022 -   Chief Complaint  Patient presents with   Consult    Pt is being seen due to recent CT.  Pt has no current complaints.     HPI Heather Lindsey 76 y.o. -referred by primary care physician Dr. Dagmar Hait for evaluation of possible interstitial lung disease.  Also sees Dr. Estanislado Pandy in rheumatology for morphea.  She tells me that she has had acid reflux for the last several years.  She is also had previous Raynaud's but review the records indicate detailed connective tissue disease work-up is negative.  As part of this she was diagnosed have hiatal hernia and then she had a CT scan that showed significant hiatal hernia then on 02/24/2022 she underwent hiatal hernia surgery by Dr. Doy Hutching.  Specifically surgery was status post esophagoscopy and hiatal hernia repair with [facility] admission nisin fundoplication.  Before the surgery she was having cough and acid reflux.  Both the symptoms have resolved.  She has never had dyspnea on exertion except when climbing heavy set of stairs this thing is still persistent.  And it is only mild.  Overall she is doing well.  But she is here for the ILD.  She does do water aerobics 3 times a week before the surgery and she never had any shortness of breath for that.  Specifically ILD question as below   [place] Comprehensive ILD Questionnaire  Symptoms:      Past Medical History :  -Morphea sees rheumatology Acid reflux with hiatal hernia status post Nissen fundoplication mid March 0932 by Dr. Kipp Brood Has osteoarthritis Has acid reflux controlled after hiatal hernia  surgery 2015 right frontal lobe brain bleed 1968 infectious mononucleosis Raynaud NOS   ROS:  Positive for fatigue and arthralgia.  Positive for dry eyes.  Positive for Raynaud's.  6 connective tissue disease autoimmune antibody panel negative.  Positive for acid reflux but now controlled after surgery  FAMILY HISTORY of LUNG DISEASE:  Denies family history of lung disease*  PERSONAL EXPOSURE HISTORY:  -Started smoking 1966.  Quit smoking 1971.  10 to 15 cigarettes a day.  No marijuana use no cocaine use.  No intravenous drug use  HOME  EXPOSURE and HOBBY DETAILS :  -[place].  The home itself is 75 years old she uses a steam iron but there is no mildew in it.  . Pt said that she used to have a cockatiel but he died years ago. Pt said that she does have a sofa that has feathers in it. ..  She does have feather pillow and sulfa  OCCUPATIONAL HISTORY (122 questions) : Detail organic and inorganic antigen exposure history is negative.  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  She has been on methotrexate for morphea 15 or 20 years ago and tries to avoid it.  She was on some prednisone 5 years ago.  INVESTIGATIONS:   She has had a CT chest with contrast: I personally visualized  this and showed it to her.  There is some ILD -there is a craniocaudal gradient.  It is very mild.  Is either postinflammatory or indeterminate for UIP pattern.  Any with a contrast CT was done in February 2023 before the hiatal hernia surgery   OV 08/18/2022  Subjective:  Patient ID: Heather Lindsey, female , DOB: Oct 27, 1946 , age 76 y.o. , MRN: [id number] , ADDRESS: [place] [address] PCP Prince Solian, MD Patient Care Team: Prince Solian, MD as PCP - General (Internal Medicine)  This Provider for this visit: Treatment Team:   Attending Provider: Brand Males, MD    08/18/2022 -   Chief Complaint  Patient presents with   Follow-up    PFT performed today.  Pt denies any complaints of SOB. Does have an occasional cough.     HPI Heather Lindsey 76 y.o. -returns for follow-up.  She presents with her husband who is a retired TEFL teacher person who is involved with hemophilia medications.  She continues to feel stable.  She has very minimal shortness of breath with exertion.  She has some cough just like last time but she tells me it is actually acute and she feels she has allergies for the last few days.  She has not tested herself for COVID.  Currently there is widespread COVID in the community.  There is no fever or chills or other COVID symptoms.  In any event she is here for her ILD work-up.  She is not on any proton pump inhibitors.  Her pulmonary function test essentially normal except for a very slight reduction in DLCO.  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  Her serologic work-up was negative.    HRCT 07/20/22  Narrative & Impression  CLINICAL DATA:  Interstitial lung disease, former smoker   EXAM: CT CHEST WITHOUT CONTRAST   TECHNIQUE: Multidetector CT imaging of the chest was performed following the standard protocol without intravenous contrast. High resolution imaging of the lungs, as well as inspiratory and expiratory imaging, was performed.   RADIATION DOSE REDUCTION: This exam was performed according to the departmental dose-optimization program which includes automated exposure control, adjustment of the mA and/or kV according to patient size and/or use of iterative reconstruction technique.   COMPARISON:  01/27/2022   FINDINGS: Cardiovascular: Scattered aortic atherosclerosis. Normal heart size. No pericardial effusion.   Mediastinum/Nodes: No enlarged mediastinal, hilar, or axillary  lymph nodes. Small hiatal hernia. Thyroid gland, trachea, and esophagus demonstrate no significant findings.   Lungs/Pleura: Unchanged mild pulmonary fibrosis in a pattern with apical to basal gradient, featuring irregular peripheral interstitial opacity, septal thickening, traction bronchiectasis, and minimal subpleural bronchiolectasis at the bilateral lung bases. No significant air trapping on expiratory phase imaging. No pleural effusion or pneumothorax.   Upper Abdomen: No acute abnormality.   Musculoskeletal: No chest wall abnormality. No acute osseous findings.   IMPRESSION: 1. Unchanged mild pulmonary fibrosis in a pattern with apical to basal gradient, featuring irregular peripheral interstitial opacity, septal thickening, traction bronchiectasis, and minimal subpleural bronchiolectasis at the bilateral lung bases. 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 [place], Iss 5, [phone], Aug 12 2017. 2. Small hiatal hernia.   Aortic Atherosclerosis (ICD10-I70.0).     Electronically Signed   By: Delanna Ahmadi M.D.   On: 07/21/2022 11:16  IMMUNOLOGY   Latest Reference Range & Units 03/02/22 13:38  Anti Nuclear Antibody (ANA) NEGATIVE  NEGATIVE  ANCA SCREEN Negative  Negative  Cyclic Citrullin Peptide Ab UNITS <16  ds DNA Ab IU/mL <1  Myeloperoxidase Abs <1.0 AI <1.0  Serine Protease 3 <1.0 AI <1.0  RA Latex Turbid. <14 IU/mL <14  ENA SM Ab Ser-aCnc <1.0 NEG AI <1.0 NEG  C3 Complement 83 - 193 mg/dL 198 (H)  C4 Complement 15 - 57 mg/dL 41  Ribonucleic Protein(ENA) Antibody, IgG <1.0 NEG AI <1.0 NEG  SSA (Ro) (ENA) Antibody, IgG <1.0 NEG AI <1.0 NEG  SSB (La) (ENA) Antibody, IgG <1.0 NEG AI <1.0 NEG  Scleroderma (Scl-70) (ENA) Antibody, IgG <1.0 NEG AI <1.0 NEG  (H): Data is abnormally high    OV  11/17/2022  Subjective:  Patient ID: Heather Lindsey, female , DOB: [birth year] , age 89 y.o. , MRN: [id number] , ADDRESS: [place] [address] PCP Prince Solian, MD Patient Care Team: Prince Solian, MD as PCP - General (Internal Medicine)  This Provider for this visit: Treatment Team:  Attending Provider: Brand Males, MD    11/17/2022 -   Chief Complaint  Patient presents with   Follow-up    PFT performed today.  Pt states she has been doing okay since last visit and denies any complaints.     HPI Heather Lindsey 76 y.o. -returns for follow-up.  Since her last visit she did have COVID but she is doing well overall.  Symptom score is stable very minimal symptoms dyspnea score is just 1.  Her pulmonary function test shows stability/improvement.  Walking desaturation test was stable except for some tachycardia with walking.  There are no new complaints.  She has gotten rid of feather pillow.  Acid reflux under control.  She wants to have her Pneumovax vaccine.  She is only had Prevnar according to the chart.  She specifically asked for Pneumovax.  WE do not have stock   SYMPTOM SCALE - ILD 03/17/2022 08/18/2022  11/17/2022   Current weight     O2 use ra ra ra  Shortness of Breath 0 -> 5 scale with 5 being worst (score 6 If unable to do)    At rest 0 0 0  Simple tasks - showers, clothes change, eating, shaving 0 00 0  Household (dishes, doing bed, laundry) 0 0 0  Shopping 0 0 0  Walking level at own pace 0 0 0  Walking up Stairs 1._0 Total (30-36) Dyspnea Score 1._1 How bad is your cough? 2 2 0  How bad is your fatigue _2 How bad is nausea 0 0 0  How bad is vomiting?  0 0 0  How bad is diarrhea? 0 0 0  How bad is anxiety? 3 2.5 1.5  How bad is depression 3 2 1.5 "SAD" wintder  Any chronic pain - if so where and how bad 3 x        Simple office walk 185 feet x  3 laps goal with forehead probe 03/17/2022  11/17/2022   O2 used ra ra  Number  laps completed 3 3  Comments about pace avg   Resting Pulse Ox/HR 100% and 79/min 100% and HR 86  Final Pulse Ox/HR 100% and 1051/min 100% and HR 115  Desaturated </= 88% no non  Desaturated <= 3% points no no  Got Tachycardic >/= 90/min yes yes  Symptoms  at end of test No complaitns No compalitns  Miscellaneous comments x x    CT Chest data  No results found.    PFT     Latest Ref Rng & Units 11/17/2022    1:01 PM 08/18/2022    8:57 AM  PFT Results  FVC-Pre L 2.93  P 2.88  P  FVC-Predicted Pre % 111  P 109  P  FVC-Post L  2.83  P  FVC-Predicted Post %  107  P  Pre FEV1/FVC % % 84  P 82  P  Post FEV1/FCV % %  82  P  FEV1-Pre L 2.46  P 2.36  P  FEV1-Predicted Pre % 124  P 119  P  FEV1-Post L  2.33  P  DLCO uncorrected ml/min/mmHg 14.26  P 12.89  P  DLCO UNC% % 78  P 71  P  DLCO corrected ml/min/mmHg 14.26  P 12.89  P  DLCO COR %Predicted % 78  P 71  P  DLVA Predicted % 74  P 67  P  TLC L  4.91  P  TLC % Predicted %  101  P  RV % Predicted %  80  P    P Preliminary result       has a past medical history of Allergy, Anxiety, 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   reports that she quit smoking about 50 years ago. Her smoking use included cigarettes. She has a 2.00 pack-year smoking history. She has been exposed to tobacco smoke. She has never used smokeless tobacco.  Past Surgical History:  Procedure Laterality Date   ANKLE FRACTURE SURGERY Left    COLONOSCOPY     ESOPHAGEAL MANOMETRY N/A 12/29/2021   Procedure: ESOPHAGEAL MANOMETRY (EM);  Surgeon: Doran Stabler, MD;  [facility]: [facility];  Service: Gastroenterology;  Laterality: N/A;  pt will stay on her PPI    ESOPHAGOGASTRODUODENOSCOPY N/A 02/24/2022   Procedure: ESOPHAGOGASTRODUODENOSCOPY (EGD);  Surgeon: Lajuana Matte, MD;  Location: [place] [facility];  Service: Thoracic;  Laterality: N/A;   POLYPECTOMY     TOTAL HIP ARTHROPLASTY Left 01/06/2019   Procedure: TOTAL HIP ARTHROPLASTY ANTERIOR APPROACH;  Surgeon: Dorna Leitz, MD;  Location: [facility];  Service: Orthopedics;  Laterality: Left;   TUBAL LIGATION     XI ROBOTIC ASSISTED HIATAL HERNIA REPAIR N/A 02/24/2022   Procedure: XI ROBOTIC ASSISTED HIATAL HERNIA REPAIR with Fundoplication using Myriad Matrix;  Surgeon: Lajuana Matte, MD;  [facility];  Service: Thoracic;  Laterality: N/A;    Allergies  Allergen Reactions   Actonel [Risedronate]     Severe chest pain   Alendronate Other (See Comments)    Severe chest pain   Boniva [Ibandronic Acid]     Chest pain    Immunization History  Administered Date(s) Administered   Influenza Split 11/02/2009, 09/02/2010, 11/15/2011, 09/25/2012, 11/25/2013, 09/30/2014   Influenza, High Dose Seasonal PF 08/16/2017, 10/21/2022   Influenza, Quadrivalent, Recombinant, Inj, Pf 09/08/2018, 09/12/2019, 10/07/2020, 10/06/2021   Influenza,inj,Quad PF,6+ Mos 08/20/2015   PFIZER(Purple Top)SARS-COV-2 Vaccination 12/28/2019, 01/18/2020, 08/07/2020   Pneumococcal Conjugate-13 11/25/2013   Tdap 10/15/2007   Zoster Recombinat (Shingrix) 03/01/2018, 06/17/2018   Zoster, Live 11/02/2009, 04/16/2013    Family History  Problem Relation Age of Onset   Heart disease Father  Liver cancer Paternal Aunt    Prostate cancer Paternal Grandfather    Colon cancer Neg Hx    Stomach cancer Neg Hx    Pancreatic cancer Neg Hx    Esophageal cancer Neg Hx    Rectal cancer Neg Hx    Colon polyps Neg Hx    Crohn's disease Neg Hx      Current Outpatient Medications:    ABILIFY 2 MG tablet, Take 2 mg by mouth daily., Disp: , Rfl:    acetaminophen (TYLENOL) 325 MG tablet, Take 2 tablets (650 mg total) by mouth every  6 (six) hours as needed for mild pain., Disp: , Rfl:    ALPRAZolam (XANAX) 0.5 MG tablet, Take 0.25 mg by mouth at bedtime as needed for anxiety or sleep., Disp: , Rfl:    baclofen (LIORESAL) 10 MG tablet, TAKE 1 TABLET BY MOUTH AT 8 AM  AND 2 PM AS NEEDED, Disp: 60 tablet, Rfl: 0   carboxymethylcellulose (REFRESH PLUS) 0.5 % SOLN, Place 1 drop into both eyes 3 (three) times daily as needed (dry eyes)., Disp: , Rfl:    Cholecalciferol 50 MCG (2000 UT) CAPS, Take 2,000 Units by mouth daily., Disp: , Rfl:    denosumab (PROLIA) 60 MG/ML SOSY injection, Inject 60 mg into the skin every 6 (six) months., Disp: , Rfl:    diclofenac Sodium (VOLTAREN) 1 % GEL, Apply 1 application. topically daily as needed (pain)., Disp: , Rfl:    fluticasone (FLONASE) 50 MCG/ACT nasal spray, Place 1 spray into both nostrils daily., Disp: , Rfl:    hyoscyamine (LEVSIN) 0.125 MG tablet, Take 0.125 mg by mouth every 4 (four) hours as needed for bladder spasms or cramping., Disp: , Rfl:    loratadine (CLARITIN) 10 MG tablet, Take 10 mg by mouth daily as needed for allergies., Disp: , Rfl:    Multiple Vitamin (MULTI-VITAMIN DAILY PO), Take 1 tablet by mouth daily., Disp: , Rfl:    Olopatadine HCl 0.2 % SOLN, Place 1 drop into both eyes daily., Disp: , Rfl:    venlafaxine XR (EFFEXOR-XR) 150 MG 24 hr capsule, Take 150 mg by mouth daily with breakfast., Disp: , Rfl:       Objective:   Vitals:   11/17/22 1327  BP: 116/70  Pulse: 86  SpO2: 100%  Weight: 155 lb 9.6 oz (70.6 kg)  Height: _0  (1.6 m)    Estimated body mass index is 27.56 kg/m as calculated from the following:   Height as of this encounter: _1  (1.6 m).   Weight as of this encounter: 155 lb 9.6 oz (70.6 kg).  _2 @  Filed Weights   11/17/22 1327  Weight: 155 lb 9.6 oz (70.6 kg)     Physical Exam General: No distress. Looks well Neuro: Alert and Oriented x 3. GCS 15. Speech normal Psych: Pleasant Resp:  Barrel Chest - no.  Wheeze -  no, Crackles - no, No overt respiratory distress CVS: Normal heart sounds. Murmurs - no Ext: Stigmata of Connective Tissue Disease - no HEENT: Normal upper airway. PEERL +. No post nasal drip        Assessment:       ICD-10-CM   1. ILD (interstitial lung disease) ([place])  J84.9     2. Hiatal hernia  K44.9     3. History of Raynaud's syndrome  Z86.79     4. Status post Nissen fundoplication  U63.335     5. Vaccine counseling  Z71.85  She has very mild/early ILD that this related to acid reflux hiatal hernia.  Other risk factors being exposure to bird feather pillow.  She is gotten rid of the bird feather.  Her acid reflux under control.  Her pulmonary function test is better.  The further natural history is not known.  Given her minimal symptoms and preserved lung function and control of risk factors we took a shared decision making just to monitor the situation as opposed to starting antifibrotic's [high pretest probability of IPF is when we would do this] or doing lung biopsy.  Will reassess again in 6 months and then decide on the CT scan.  There is a small but real possibility that her ILD changes resolved with acid reflux control but I highly doubt it.  Will just have to see at follow-up.    Plan:     Patient Instructions  ILD (interstitial lung disease) ([place]) Hiatal hernia Status post Nissen fundoplication History of Raynaud's syndrome   - mild/early ILD since feb 2023 in setting of hiatal hernia and GERD; - unclear if this is IPF v non-IPF;   - -s/p Nissen in March 2023 nd glad you got rid of feather pillos - currently 11/17/2022 - stable v PFT improved  plan - given stability (and controlling risk - gerd and feather pillow)  will monitor you closely instead of biopsy or anti-fibrotic -Consider getting Pneumovax on your own [we do not have stock] -  29month do spiomtery and dlco    Followup - 6 months but after spirome /dlco  - symptoms score and walk test at  followup  - can decide HRCT followup at time of next visit    SIGNATURE    Dr. MBrand Males M.D., F.C.C.P,  Pulmonary and Critical Care Medicine Staff Physician, CGlencoeDirector - Interstitial Lung Disease  Program  Pulmonary [place] [id number] Pager: [phone] If no answer or between  15:00h - 7:00h: call [phone] Telephone: [phone]  2:13 PM 11/17/2022

## 2022-11-29 ENCOUNTER — Ambulatory Visit: Payer: Medicare Other | Admitting: Physician Assistant

## 2022-11-29 DIAGNOSIS — M19042 Primary osteoarthritis, left hand: Secondary | ICD-10-CM

## 2022-11-29 DIAGNOSIS — Z8659 Personal history of other mental and behavioral disorders: Secondary | ICD-10-CM

## 2022-11-29 DIAGNOSIS — Z96642 Presence of left artificial hip joint: Secondary | ICD-10-CM

## 2022-11-29 DIAGNOSIS — Z79899 Other long term (current) drug therapy: Secondary | ICD-10-CM

## 2022-11-29 DIAGNOSIS — Z8639 Personal history of other endocrine, nutritional and metabolic disease: Secondary | ICD-10-CM

## 2022-11-29 DIAGNOSIS — K449 Diaphragmatic hernia without obstruction or gangrene: Secondary | ICD-10-CM

## 2022-11-29 DIAGNOSIS — M810 Age-related osteoporosis without current pathological fracture: Secondary | ICD-10-CM

## 2022-11-29 DIAGNOSIS — Z8669 Personal history of other diseases of the nervous system and sense organs: Secondary | ICD-10-CM

## 2022-11-29 DIAGNOSIS — R202 Paresthesia of skin: Secondary | ICD-10-CM

## 2022-11-29 DIAGNOSIS — R9389 Abnormal findings on diagnostic imaging of other specified body structures: Secondary | ICD-10-CM

## 2022-11-29 DIAGNOSIS — M7061 Trochanteric bursitis, right hip: Secondary | ICD-10-CM

## 2022-11-29 DIAGNOSIS — L940 Localized scleroderma [morphea]: Secondary | ICD-10-CM

## 2022-11-29 DIAGNOSIS — Z8719 Personal history of other diseases of the digestive system: Secondary | ICD-10-CM

## 2022-11-29 DIAGNOSIS — G8929 Other chronic pain: Secondary | ICD-10-CM

## 2022-11-29 DIAGNOSIS — I7300 Raynaud's syndrome without gangrene: Secondary | ICD-10-CM

## 2022-12-22 NOTE — Progress Notes (Deleted)
Office Visit Note  Patient: Heather Lindsey             Date of Birth: [birth year]           MRN: [id number]             PCP: Prince Solian, MD Referring: Prince Solian, MD Visit Date: 01/04/2023 Occupation: '@GUAROCC'$ @  Subjective:  No chief complaint on file.   History of Present Illness: Heather Lindsey is a 77 y.o. female ***     Activities of Daily Living:  Patient reports morning stiffness for *** {minute/hour:19697}.   Patient {ACTIONS;DENIES/REPORTS:21021675::"Denies"} nocturnal pain.  Difficulty dressing/grooming: {ACTIONS;DENIES/REPORTS:21021675::"Denies"} Difficulty climbing stairs: {ACTIONS;DENIES/REPORTS:21021675::"Denies"} Difficulty getting out of chair: {ACTIONS;DENIES/REPORTS:21021675::"Denies"} Difficulty using hands for taps, buttons, cutlery, and/or writing: {ACTIONS;DENIES/REPORTS:21021675::"Denies"}  No Rheumatology ROS completed.   PMFS History:  Patient Active Problem List   Diagnosis Date Noted   Hiatal hernia 02/24/2022   Heartburn    Cough 01/06/2019   Anxiety 01/05/2019   Left displaced femoral neck fracture ([place]) 01/05/2019   Hyponatremia 01/05/2019   Raynaud's phenomenon without gangrene 10/25/2016   Trochanteric bursitis of both hips 10/25/2016   Gastroesophageal reflux disease 10/25/2016   Former smoker, 10/25/2016   Intracranial hemorrhage, spontaneous subarachnoid, idiopathic, chronic ([place]) 09/02/2015   Bleeding in brain due to blood pressure disorder ([place]) 10/01/2014   HYPERCHOLESTEROLEMIA 11/18/2008   Depression 11/18/2008   IRRITABLE BOWEL SYNDROME 11/18/2008   Morphea 11/18/2008   Primary osteoarthritis of both hands 11/18/2008   Age related osteoporosis 11/18/2008   MIGRAINES, HX OF 11/18/2008    Past Medical History:  Diagnosis Date   Allergy    SEASONAL   Anxiety    Bleeding in brain due to blood pressure disorder ([place]) 10/01/2014   Brain bleed ([place])    Broken wrist    LEFT,2006   Cataract    BILATERAL NO SURGERY YET  UPDATED 06/09/22   Circumscribed scleroderma    Depression    Diverticulosis    GERD (gastroesophageal reflux disease)    Hyperlipidemia    IBS (irritable bowel syndrome)    Memory loss    Migraines    Osteoarthritis    Osteoporosis    Stroke ([place]) 2015    Family History  Problem Relation Age of Onset   Heart disease Father    Liver cancer Paternal Aunt    Prostate cancer Paternal Grandfather    Colon cancer Neg Hx    Stomach cancer Neg Hx    Pancreatic cancer Neg Hx    Esophageal cancer Neg Hx    Rectal cancer Neg Hx    Colon polyps Neg Hx    Crohn's disease Neg Hx    Past Surgical History:  Procedure Laterality Date   ANKLE FRACTURE SURGERY Left    COLONOSCOPY     ESOPHAGEAL MANOMETRY N/A 12/29/2021   Procedure: ESOPHAGEAL MANOMETRY (EM);  Surgeon: Doran Stabler, MD;  [facility];  Service: Gastroenterology;  Laterality: N/A;  pt will stay on her PPI   ESOPHAGOGASTRODUODENOSCOPY N/A 02/24/2022   Procedure: ESOPHAGOGASTRODUODENOSCOPY (EGD);  Surgeon: Lajuana Matte, MD;  Location: [place];  Service: Thoracic;  Laterality: N/A;   POLYPECTOMY     TOTAL HIP ARTHROPLASTY Left 01/06/2019   Procedure: TOTAL HIP ARTHROPLASTY ANTERIOR APPROACH;  Surgeon: Dorna Leitz, MD;  [facility];  Service: Orthopedics;  Laterality: Left;   TUBAL LIGATION     XI ROBOTIC ASSISTED HIATAL HERNIA REPAIR N/A 02/24/2022   Procedure: XI ROBOTIC ASSISTED HIATAL HERNIA REPAIR with  Fundoplication using Myriad Matrix;  Surgeon: Lajuana Matte, MD;  Location[facility] OR;  Service: Thoracic;  Laterality: N/A;   Social History   Social History Narrative   Daily caffeine, 2 glasses daily,   Right handed, Married, 2 sons. Retired. College.   Immunization History  Administered Date(s) Administered   Influenza Split 11/02/2009, 09/02/2010, 11/15/2011, 09/25/2012, 11/25/2013, 09/30/2014   Influenza, High Dose Seasonal PF 08/16/2017, 10/21/2022   Influenza, Quadrivalent,  Recombinant, Inj, Pf 09/08/2018, 09/12/2019, 10/07/2020, 10/06/2021   Influenza,inj,Quad PF,6+ Mos 08/20/2015   PFIZER(Purple Top)SARS-COV-2 Vaccination 12/28/2019, 01/18/2020, 08/07/2020   Pneumococcal Conjugate-13 11/25/2013   Tdap 10/15/2007   Zoster Recombinat (Shingrix) 03/01/2018, 06/17/2018   Zoster, Live 11/02/2009, 04/16/2013     Objective: Vital Signs: There were no vitals taken for this visit.   Physical Exam   Musculoskeletal Exam: ***  CDAI Exam: CDAI Score: -- Patient Global: --; Provider Global: -- Swollen: --; Tender: -- Joint Exam 01/04/2023   No joint exam has been documented for this visit   There is currently no information documented on the homunculus. Go to the Rheumatology activity and complete the homunculus joint exam.  Investigation: No additional findings.  Imaging: No results found.  Recent Labs: Lab Results  Component Value Date   WBC 11.2 (H) 02/25/2022   HGB 11.0 (L) 02/25/2022   PLT 218 02/25/2022   NA 135 02/25/2022   K 4.0 02/25/2022   CL 100 02/25/2022   CO2 25 02/25/2022   GLUCOSE 114 (H) 02/25/2022   BUN 8 02/25/2022   CREATININE 0.87 02/25/2022   BILITOT 0.4 02/22/2022   ALKPHOS 57 02/22/2022   AST 19 02/22/2022   ALT 15 02/22/2022   PROT 7.1 02/22/2022   ALBUMIN 3.6 02/22/2022   CALCIUM 8.3 (L) 02/25/2022   GFRAA 71 03/18/2021    Speciality Comments: No specialty comments available.  Procedures:  No procedures performed Allergies: Actonel [risedronate], Alendronate, and Boniva [ibandronic acid]   Assessment / Plan:     Visit Diagnoses: No diagnosis found.  Orders: No orders of the defined types were placed in this encounter.  No orders of the defined types were placed in this encounter.   Face-to-face time spent with patient was *** minutes. Greater than 50% of time was spent in counseling and coordination of care.  Follow-Up Instructions: No follow-ups on file.   Earnestine Mealing, CMA  Note - This  record has been created using Editor, commissioning.  Chart creation errors have been sought, but may not always  have been located. Such creation errors do not reflect on  the standard of medical care.

## 2023-01-04 ENCOUNTER — Ambulatory Visit: Payer: Medicare Other | Admitting: Physician Assistant

## 2023-01-04 DIAGNOSIS — M545 Low back pain, unspecified: Secondary | ICD-10-CM

## 2023-01-04 DIAGNOSIS — G8929 Other chronic pain: Secondary | ICD-10-CM

## 2023-01-04 DIAGNOSIS — M810 Age-related osteoporosis without current pathological fracture: Secondary | ICD-10-CM

## 2023-01-04 DIAGNOSIS — Z96642 Presence of left artificial hip joint: Secondary | ICD-10-CM

## 2023-01-04 DIAGNOSIS — R202 Paresthesia of skin: Secondary | ICD-10-CM

## 2023-01-04 DIAGNOSIS — Z8719 Personal history of other diseases of the digestive system: Secondary | ICD-10-CM

## 2023-01-04 DIAGNOSIS — M7061 Trochanteric bursitis, right hip: Secondary | ICD-10-CM

## 2023-01-04 DIAGNOSIS — L940 Localized scleroderma [morphea]: Secondary | ICD-10-CM

## 2023-01-04 DIAGNOSIS — Z8669 Personal history of other diseases of the nervous system and sense organs: Secondary | ICD-10-CM

## 2023-01-04 DIAGNOSIS — Z8659 Personal history of other mental and behavioral disorders: Secondary | ICD-10-CM

## 2023-01-04 DIAGNOSIS — I7300 Raynaud's syndrome without gangrene: Secondary | ICD-10-CM

## 2023-01-04 DIAGNOSIS — Z79899 Other long term (current) drug therapy: Secondary | ICD-10-CM

## 2023-01-04 DIAGNOSIS — R9389 Abnormal findings on diagnostic imaging of other specified body structures: Secondary | ICD-10-CM

## 2023-01-04 DIAGNOSIS — Z8639 Personal history of other endocrine, nutritional and metabolic disease: Secondary | ICD-10-CM

## 2023-01-04 DIAGNOSIS — K449 Diaphragmatic hernia without obstruction or gangrene: Secondary | ICD-10-CM

## 2023-01-04 DIAGNOSIS — M19042 Primary osteoarthritis, left hand: Secondary | ICD-10-CM

## 2023-01-11 NOTE — Progress Notes (Unsigned)
Office Visit Note  Patient: Heather Lindsey             Date of Birth: [birth year]           MRN: [id number]             PCP: Prince Solian, MD Referring: Prince Solian, MD Visit Date: 01/24/2023 Occupation: '@GUAROCC'$ @  Subjective:  Trochanteric bursitis, right worse than left  History of Present Illness: Heather Lindsey is a 77 y.o. female with history of morphea.  Patient is not currently taking any immunosuppressive agents.  She discontinued methotrexate in 2020.  For morphea patches are unchanged.  She denies any new lesions recently.  She was last evaluated by Dr. Sharol Roussel on 10/31/2018 for management of morphea.  She denies any symptoms of raynaud's phenomenon.   Patient was evaluated by her PCP Dr. Dagmar Hait yesterday on 01/23/2023.  She had updated lab work on 01/16/2023.  She is no longer taking celebrex.  She continues to have persistent discomfort on the lateral aspect of both hips consistent with bursitis.  Her pain has been most severe on the right side.  It often wakes her up at night while she is side sleeping.  She has tried performing stretching exercises in the past and continues to go to water aerobics 3 days a week.  She would like to try a cortisone injection prior to restarting celebrex.      Activities of Daily Living:  Patient reports morning stiffness in both hands.   Patient Reports nocturnal pain.  Difficulty dressing/grooming: Denies Difficulty climbing stairs: Denies Difficulty getting out of chair: Denies Difficulty using hands for taps, buttons, cutlery, and/or writing: Reports  Review of Systems  Constitutional:  Positive for fatigue.  HENT:  Positive for mouth dryness. Negative for mouth sores.   Eyes:  Positive for dryness.  Respiratory: Negative.  Negative for shortness of breath.   Cardiovascular: Negative.  Negative for chest pain and palpitations.  Gastrointestinal: Negative.  Negative for blood in stool, constipation and diarrhea.  Endocrine: Negative.   Negative for increased urination.  Genitourinary: Negative.  Negative for involuntary urination.  Musculoskeletal:  Positive for joint pain, joint pain, joint swelling, myalgias, morning stiffness, muscle tenderness and myalgias. Negative for gait problem and muscle weakness.  Skin: Negative.  Negative for color change, rash, hair loss and sensitivity to sunlight.  Allergic/Immunologic: Negative.  Negative for susceptible to infections.  Neurological: Negative.  Negative for dizziness and headaches.  Hematological: Negative.  Negative for swollen glands.  Psychiatric/Behavioral:  Positive for depressed mood. Negative for sleep disturbance. The patient is not nervous/anxious.     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[place]) 10/01/2014   HYPERCHOLESTEROLEMIA 11/18/2008   Depression 11/18/2008   IRRITABLE BOWEL SYNDROME 11/18/2008   Morphea 11/18/2008   Primary osteoarthritis of both hands 11/18/2008   Age related osteoporosis 11/18/2008   MIGRAINES, HX OF 11/18/2008    Past Medical History:  Diagnosis Date   Allergy    SEASONAL   Anxiety    Bleeding  in brain due to blood pressure disorder ([place]) 10/01/2014   Brain bleed ([place])    Broken wrist    LEFT,2006   Cataract    BILATERAL NO SURGERY YET UPDATED 06/09/22   Circumscribed scleroderma    Depression    Diverticulosis    GERD (gastroesophageal reflux disease)    Hyperlipidemia    IBS (irritable bowel syndrome)    Memory loss     Migraines    Osteoarthritis    Osteoporosis    Stroke ([place]) 2015    Family History  Problem Relation Age of Onset   Heart disease Father    Liver cancer Paternal Aunt    Prostate cancer Paternal Grandfather    Colon cancer Neg Hx    Stomach cancer Neg Hx    Pancreatic cancer Neg Hx    Esophageal cancer Neg Hx    Rectal cancer Neg Hx    Colon polyps Neg Hx    Crohn's disease Neg Hx    Past Surgical History:  Procedure Laterality Date   ANKLE FRACTURE SURGERY Left    COLONOSCOPY     ESOPHAGEAL MANOMETRY N/A 12/29/2021   Procedure: ESOPHAGEAL MANOMETRY (EM);  Surgeon: Doran Stabler, MD;  Location: [facility];  Service: Gastroenterology;  Laterality: N/A;  pt will stay on her PPI   ESOPHAGOGASTRODUODENOSCOPY N/A 02/24/2022   Procedure: ESOPHAGOGASTRODUODENOSCOPY (EGD);  Surgeon: Lajuana Matte, MD;  Location: [facility];  Service: Thoracic;  Laterality: N/A;   POLYPECTOMY     TOTAL HIP ARTHROPLASTY Left 01/06/2019   Procedure: TOTAL HIP ARTHROPLASTY ANTERIOR APPROACH;  Surgeon: Dorna Leitz, MD;  Location: [facility];  Service: Orthopedics;  Laterality: Left;   TUBAL LIGATION     XI ROBOTIC ASSISTED HIATAL HERNIA REPAIR N/A 02/24/2022   Procedure: XI ROBOTIC ASSISTED HIATAL HERNIA REPAIR with Fundoplication using Myriad Matrix;  Surgeon: Lajuana Matte, MD;  Location: [facility];  Service: Thoracic;  Laterality: N/A;   Social History   Social History Narrative   Daily caffeine, 2 glasses daily,   Right handed, Married, 2 sons. Retired. College.   Immunization History  Administered Date(s) Administered   Influenza Split 11/02/2009, 09/02/2010, 11/15/2011, 09/25/2012, 11/25/2013, 09/30/2014   Influenza, High Dose Seasonal PF 08/16/2017, 10/21/2022   Influenza, Quadrivalent, Recombinant, Inj, Pf 09/08/2018, 09/12/2019, 10/07/2020, 10/06/2021   Influenza,inj,Quad PF,6+ Mos 08/20/2015   PFIZER(Purple Top)SARS-COV-2 Vaccination 12/28/2019, 01/18/2020, 08/07/2020   Pneumococcal  Conjugate-13 11/25/2013   Tdap 10/15/2007   Zoster Recombinat (Shingrix) 03/01/2018, 06/17/2018   Zoster, Live 11/02/2009, 04/16/2013     Objective: Vital Signs: BP 122/76 (BP Location: Left Arm, Patient Position: Sitting, Cuff Size: Normal)   Pulse 93   Resp 16   Ht '5\' 3"'$  (1.6 m)   Wt 154 lb 3.2 oz (69.9 kg)   BMI 27.32 kg/m    Physical Exam Vitals and nursing note reviewed.  Constitutional:      Appearance: She is well-developed.  HENT:     Head: Normocephalic and atraumatic.  Eyes:     Conjunctiva/sclera: Conjunctivae normal.  Cardiovascular:     Rate and Rhythm: Normal rate and regular rhythm.     Heart sounds: Normal heart sounds.  Pulmonary:     Effort: Pulmonary effort is normal.     Breath sounds: Normal breath sounds.  Abdominal:     General: Bowel sounds are normal.     Palpations: Abdomen is soft.  Musculoskeletal:     Cervical back: Normal range of motion.  Skin:  General: Skin is warm and dry.     Capillary Refill: Capillary refill takes less than 2 seconds.     Comments: Morphea on trunk and extremities.  No digital ulcers or signs of sclerodactyly.   Neurological:     Mental Status: She is alert and oriented to person, place, and time.  Psychiatric:        Behavior: Behavior normal.      Musculoskeletal Exam: C-spine, thoracic spine, lumbar spine have good range of motion.  Shoulder joints, elbow joints, wrist joints, MCPs, PIPs, DIPs have good range of motion with no synovitis.  Left hip replacement has good range of motion with no groin pain.  She has tenderness over bilateral trochanteric bursa, right greater than left.  Tenderness along the right IT band noted.  Both knee joints have good range of motion with no warmth or effusion.  Ankle joints have good range of motion with no tenderness or joint swelling.  CDAI Exam: CDAI Score: -- Patient Global: --; Provider Global: -- Swollen: --; Tender: -- Joint Exam 01/24/2023   No joint exam has  been documented for this visit   There is currently no information documented on the homunculus. Go to the Rheumatology activity and complete the homunculus joint exam.  Investigation: No additional findings.  Imaging: No results found.  Recent Labs: Lab Results  Component Value Date   WBC 11.2 (H) 02/25/2022   HGB 11.0 (L) 02/25/2022   PLT 218 02/25/2022   NA 135 02/25/2022   K 4.0 02/25/2022   CL 100 02/25/2022   CO2 25 02/25/2022   GLUCOSE 114 (H) 02/25/2022   BUN 8 02/25/2022   CREATININE 0.87 02/25/2022   BILITOT 0.4 02/22/2022   ALKPHOS 57 02/22/2022   AST 19 02/22/2022   ALT 15 02/22/2022   PROT 7.1 02/22/2022   ALBUMIN 3.6 02/22/2022   CALCIUM 8.3 (L) 02/25/2022   GFRAA 71 03/18/2021    Speciality Comments: No specialty comments available.  Procedures:  Large Joint Inj: R greater trochanter on 01/24/2023 3:43 PM Indications: pain Details: 27 G 1.5 in needle, lateral approach  Arthrogram: No  Medications: 1.5 mL lidocaine 1 %; 40 mg triamcinolone acetonide 40 MG/ML Aspirate: 0 mL Outcome: tolerated well, no immediate complications Procedure, treatment alternatives, risks and benefits explained, specific risks discussed. Consent was given by the patient. Immediately prior to procedure a time out was called to verify the correct patient, procedure, equipment, support staff and site/side marked as required. Patient was prepped and draped in the usual sterile fashion.     Allergies: Actonel [risedronate], Alendronate, and Boniva [ibandronic acid]     Assessment / Plan:     Visit Diagnoses: Morphea - Last seen by Dr. Sharol Roussel on 10/31/18.   Unchanged-trunk and extremities.  Not currently taking any immunosuppressive agents.  Discontinued methotrexate in 2020-no new or worsening symptoms since then.   No signs of sclerodactyly.  No symptoms of raynaud's phenomenon recently.   Lab work from 03/02/22 was reviewed today in the office: CK 71, ESR 34, RF negative,  anti-CCP negative, ANA negative, SCL 70 negative, RNP negative, Smith negative, Ro negative, La negative, double-stranded and negative, C3 198, C4 41, anti-Jo1 negative, pan ANCA negative. Lab work from 01/16/2023 was reviewed today in the office: Creatinine 0.8, GFR 69.9, AST 18, ALT 11, white blood cell count 4.88, red blood cell count 4.0, hemoglobin 13.0, platelet count 196, TSH 1.68, hemoglobin A1c 5.2%. She does not require immunosuppressive therapy at this time.  Advised patient to notify us if she develops any new or worsening symptoms.  She will follow up in 5 months or sooner if needed.    Abnormal chest CT - Chest CT on 01/27/22: Findings in the lungs are concerning for potential interstitial lung disease. Reviewed office visit note from Dr. 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  Stable PFTs improved.  Plan to continue to monitor closely instead of proceeding with a biopsy or antifibrotic agents. PCV20 administered on 01/23/23.  Up to date on vaccines per Dr. Dagmar Hait.    High risk medication use - Discontinued methotrexate in 2020 due to the concern for immunosuppression during the covid-19 pandemic.  She is not currently taking any immunosuppressive agents.   Raynaud's phenomenon without gangrene: Not currently active.  No signs of sclerodactyly noted. No digital ulcers.  No longer taking norvasc.   Left hand paresthesia: Asymptomatic currently.   Chronic left shoulder pain: Good ROM of the left shoulder with no discomfort at this time.   Primary osteoarthritis of both hands: She has PIP and DIP thickening consistent with osteoarthritis of both hands.  No inflammation noted.   Status post total hip replacement, left: Doing well.  No groin pain currently.    Trochanteric bursitis of right hip -She presents today with ongoing pain on the lateral aspect of the right hip consistent with trochanteric bursitis.  She has been experiencing  pain when lying on her right side at night.  She also has discomfort if sitting in the car for prolonged periods of time.  On examination she has good range of motion of the right hip joint with no groin pain.  She has tenderness over the right trochanteric bursa and along the right IT band.  Different treatment options were discussed today including physical therapy, home exercises, and a cortisone injection.  Patient requested a right trochanter bursa cortisone injection today.  She tolerated the procedure well.  Procedure note was completed above.  Aftercare was discussed.  She was advised to notify us if her symptoms persist or worsen.  She was given a handout of exercises to start performing once her symptoms have improved.  Plan: Large Joint Inj: R greater trochanter  Age-related osteoporosis without current pathological fracture - DEXA updated on 03/04/19: RFN BMD 0.558 with T-score -2.6. updated DEXA on 10/11/21 per Dr. Danna Hefty noted. D[facility]As updated at [facility].  She is on Prolia 60 mg sq injections every 6 months prescribed by Dr. Dagmar Hait.  She is taking vitamin D 2000 units daily.    Hiatal hernia - Chest CT performed on 01/27/2022: Small hiatal hernia. Status post hiatal hernia repair on 02/24/2022 performed by Dr. Kipp Brood.  Other medical conditions are listed as follows:   History of hypercholesterolemia: Crestor 5 mg daily.   History of IBS  History of gastroesophageal reflux (GERD)  History of depression  History of migraine  Orders: Orders Placed This Encounter  Procedures   Large Joint Inj: R greater trochanter   Meds ordered this encounter  Medications   baclofen (LIORESAL) 10 MG tablet    Sig: Take 1 tablet (10 mg total) by mouth daily as needed for muscle spasms.    Dispense:  60 tablet    Refill:  0     Follow-Up Instructions: Return in about 5 months (around 06/24/2023) for Morphea.   Ofilia Neas, PA-C  Note - This record has been created using Dragon software.  Chart  creation  errors have been sought, but may not always  have been located. Such creation errors do not reflect on  the standard of medical care.

## 2023-01-24 ENCOUNTER — Ambulatory Visit: Payer: Medicare Other | Attending: Physician Assistant | Admitting: Physician Assistant

## 2023-01-24 ENCOUNTER — Encounter: Payer: Self-pay | Admitting: Physician Assistant

## 2023-01-24 VITALS — BP 122/76 | HR 93 | Resp 16 | Ht 63.0 in | Wt 154.2 lb

## 2023-01-24 DIAGNOSIS — M19041 Primary osteoarthritis, right hand: Secondary | ICD-10-CM

## 2023-01-24 DIAGNOSIS — R9389 Abnormal findings on diagnostic imaging of other specified body structures: Secondary | ICD-10-CM | POA: Diagnosis not present

## 2023-01-24 DIAGNOSIS — M7061 Trochanteric bursitis, right hip: Secondary | ICD-10-CM | POA: Diagnosis not present

## 2023-01-24 DIAGNOSIS — Z96642 Presence of left artificial hip joint: Secondary | ICD-10-CM

## 2023-01-24 DIAGNOSIS — L940 Localized scleroderma [morphea]: Secondary | ICD-10-CM | POA: Diagnosis not present

## 2023-01-24 DIAGNOSIS — Z79899 Other long term (current) drug therapy: Secondary | ICD-10-CM

## 2023-01-24 DIAGNOSIS — M19042 Primary osteoarthritis, left hand: Secondary | ICD-10-CM

## 2023-01-24 DIAGNOSIS — G8929 Other chronic pain: Secondary | ICD-10-CM

## 2023-01-24 DIAGNOSIS — Z8669 Personal history of other diseases of the nervous system and sense organs: Secondary | ICD-10-CM

## 2023-01-24 DIAGNOSIS — K449 Diaphragmatic hernia without obstruction or gangrene: Secondary | ICD-10-CM

## 2023-01-24 DIAGNOSIS — I7300 Raynaud's syndrome without gangrene: Secondary | ICD-10-CM

## 2023-01-24 DIAGNOSIS — M25512 Pain in left shoulder: Secondary | ICD-10-CM

## 2023-01-24 DIAGNOSIS — Z8659 Personal history of other mental and behavioral disorders: Secondary | ICD-10-CM

## 2023-01-24 DIAGNOSIS — R202 Paresthesia of skin: Secondary | ICD-10-CM

## 2023-01-24 DIAGNOSIS — Z8639 Personal history of other endocrine, nutritional and metabolic disease: Secondary | ICD-10-CM

## 2023-01-24 DIAGNOSIS — M810 Age-related osteoporosis without current pathological fracture: Secondary | ICD-10-CM

## 2023-01-24 DIAGNOSIS — M545 Low back pain, unspecified: Secondary | ICD-10-CM

## 2023-01-24 DIAGNOSIS — Z8719 Personal history of other diseases of the digestive system: Secondary | ICD-10-CM

## 2023-01-24 MED ORDER — TRIAMCINOLONE ACETONIDE 40 MG/ML IJ SUSP
40.0000 mg | INTRAMUSCULAR | Status: AC | PRN
  Administered 2023-01-24: 40 mg via INTRA_ARTICULAR

## 2023-01-24 MED ORDER — LIDOCAINE HCL 1 % IJ SOLN
1.5000 mL | INTRAMUSCULAR | Status: AC | PRN
  Administered 2023-01-24: 1.5 mL

## 2023-01-24 MED ORDER — BACLOFEN 10 MG PO TABS: 10.0000 mg | ORAL_TABLET | Freq: Every day | ORAL | 0 refills | Status: DC | PRN

## 2023-01-24 NOTE — Patient Instructions (Signed)
Iliotibial Band Syndrome Rehab Ask your health care provider which exercises are safe for you. Do exercises exactly as told by your health care provider and adjust them as directed. It is normal to feel mild stretching, pulling, tightness, or discomfort as you do these exercises. Stop right away if you feel sudden pain or your pain gets significantly worse. Do not begin these exercises until told by your health care provider. Stretching and range-of-motion exercises These exercises warm up your muscles and joints and improve the movement and flexibility of your hip and pelvis. Quadriceps stretch, prone  Lie on your abdomen (prone position) on a firm surface, such as a bed or padded floor. Bend your left / right knee and reach back to hold your ankle or pant leg. If you cannot reach your ankle or pant leg, loop a belt around your foot and grab the belt instead. Gently pull your heel toward your buttocks. Your knee should not slide out to the side. You should feel a stretch in the front of your thigh and knee (quadriceps). Hold this position for __________ seconds. Repeat __________ times. Complete this exercise __________ times a day. Iliotibial band stretch An iliotibial band is a strong band of muscle tissue that runs from the outer side of your hip to the outer side of your thigh and knee. Lie on your side with your left / right leg in the top position. Bend both of your knees and grab your left / right ankle. Stretch out your bottom arm to help you balance. Slowly bring your top knee back so your thigh goes behind your trunk. Slowly lower your top leg toward the floor until you feel a gentle stretch on the outside of your left / right hip and thigh. If you do not feel a stretch and your knee will not fall farther, place the heel of your other foot on top of your knee and pull your knee down toward the floor with your foot. Hold this position for __________ seconds. Repeat __________ times.  Complete this exercise __________ times a day. Strengthening exercises These exercises build strength and endurance in your hip and pelvis. Endurance is the ability to use your muscles for a long time, even after they get tired. Straight leg raises, side-lying This exercise strengthens the muscles that rotate the leg at the hip and move it away from your body (hip abductors). Lie on your side with your left / right leg in the top position. Lie so your head, shoulder, hip, and knee line up. You may bend your bottom knee to help you balance. Roll your hips slightly forward so your hips are stacked directly over each other and your left / right knee is facing forward. Tense the muscles in your outer thigh and lift your top leg 4-6 inches (10-15 cm). Hold this position for __________ seconds. Slowly lower your leg to return to the starting position. Let your muscles relax completely before doing another repetition. Repeat __________ times. Complete this exercise __________ times a day. Leg raises, prone This exercise strengthens the muscles that move the hips backward (hip extensors). Lie on your abdomen (prone position) on your bed or a firm surface. You can put a pillow under your hips if that is more comfortable for your lower back. Bend your left / right knee so your foot is straight up in the air. Squeeze your buttocks muscles and lift your left / right thigh off the bed. Do not let your back arch. Tense   your thigh muscle as hard as you can without increasing any knee pain. Hold this position for __________ seconds. Slowly lower your leg to return to the starting position and allow it to relax completely. Repeat __________ times. Complete this exercise __________ times a day. Hip hike Stand sideways on a bottom step. Stand on your left / right leg with your other foot unsupported next to the step. You can hold on to a railing or wall for balance if needed. Keep your knees straight and your  torso square. Then lift your left / right hip up toward the ceiling. Slowly let your left / right hip lower toward the floor, past the starting position. Your foot should get closer to the floor. Do not lean or bend your knees. Repeat __________ times. Complete this exercise __________ times a day. This information is not intended to replace advice given to you by your health care provider. Make sure you discuss any questions you have with your health care provider. Document Revised: 02/05/2020 Document Reviewed: 02/05/2020 Elsevier Patient Education  [place]. Hip Bursitis Rehab Ask your health care provider which exercises are safe for you. Do exercises exactly as told by your health care provider and adjust them as directed. It is normal to feel mild stretching, pulling, tightness, or discomfort as you do these exercises. Stop right away if you feel sudden pain or your pain gets worse. Do not begin these exercises until told by your health care provider. Stretching exercise This exercise warms up your muscles and joints and improves the movement and flexibility of your hip. This exercise also helps to relieve pain and stiffness. Iliotibial band stretch An iliotibial band is a strong band of muscle tissue that runs from the outer side of your hip to the outer side of your thigh and knee. Lie on your side with your left / right leg in the top position. Bend your left / right knee and grab your ankle. Stretch out your bottom arm to help you balance. Slowly bring your knee back so your thigh is slightly behind your body. Slowly lower your knee toward the floor until you feel a gentle stretch on the outside of your left / right thigh. If you do not feel a stretch and your knee will not lower more toward the floor, place the heel of your other foot on top of your knee and pull your knee down toward the floor with your foot. Hold this position for __________ seconds. Slowly return to the  starting position. Repeat __________ times. Complete this exercise __________ times a day. Strengthening exercises These exercises build strength and endurance in your hip and pelvis. Endurance is the ability to use your muscles for a long time, even after they get tired. Bridge This exercise strengthens the muscles that move your thigh backward (hip extensors). Lie on your back on a firm surface with your knees bent and your feet flat on the floor. Tighten your buttocks muscles and lift your buttocks off the floor until your trunk is level with your thighs. Do not arch your back. You should feel the muscles working in your buttocks and the back of your thighs. If you do not feel these muscles, slide your feet 1-2 inches (2.5-5 cm) farther away from your buttocks. If this exercise is too easy, try doing it with your arms crossed over your chest. Hold this position for __________ seconds. Slowly lower your hips to the starting position. Let your muscles relax completely after  each repetition. Repeat __________ times. Complete this exercise __________ times a day. Squats This exercise strengthens the muscles in front of your thigh and knee (quadriceps). Stand in front of a table, with your feet and knees pointing straight ahead. You may rest your hands on the table for balance but not for support. Slowly bend your knees and lower your hips like you are going to sit in a chair. Keep your weight over your heels, not over your toes. Keep your lower legs upright so they are parallel with the table legs. Do not let your hips go lower than your knees. Do not bend lower than told by your health care provider. If your hip pain increases, do not bend as low. Hold the squat position for __________ seconds. Slowly push with your legs to return to standing. Do not use your hands to pull yourself to standing. Repeat __________ times. Complete this exercise __________ times a day. Hip hike  Stand  sideways on a bottom step. Stand on your left / right leg with your other foot unsupported next to the step. You can hold on to the railing or wall for balance if needed. Keep your knees straight and your torso square. Then lift your left / right hip up toward the ceiling. Hold this position for __________ seconds. Slowly let your left / right hip lower toward the floor, past the starting position. Your foot should get closer to the floor. Do not lean or bend your knees. Repeat __________ times. Complete this exercise __________ times a day. Single leg stand This exercise increases your balance. Without shoes, stand near a railing or in a doorway. You may hold on to the railing or door frame as needed for balance. Squeeze your left / right buttock muscles, then lift up your other foot. Do not let your left / right hip push out to the side. It is helpful to stand in front of a mirror for this exercise so you can watch your hip. Hold this position for __________ seconds. Repeat __________ times. Complete this exercise __________ times a day. This information is not intended to replace advice given to you by your health care provider. Make sure you discuss any questions you have with your health care provider. Document Revised: 11/10/2021 Document Reviewed: 11/10/2021 Elsevier Patient Education  [place].

## 2023-01-27 ENCOUNTER — Telehealth: Payer: Self-pay | Admitting: *Deleted

## 2023-01-27 NOTE — Telephone Encounter (Signed)
Labs received from:[facility]  Drawn on:01/16/2023  Reviewed by:Hazel Sams, PA-C  Labs drawn:CMP, Lipid Panel  Results:HDL 69  LDL/HDL Ratio 1.3

## 2023-04-06 ENCOUNTER — Other Ambulatory Visit: Payer: Self-pay | Admitting: Physician Assistant

## 2023-04-06 NOTE — Telephone Encounter (Signed)
Last Fill: 01/24/2023  Next Visit: 06/29/2023  Last Visit: 01/24/2023  Dx: Trochanteric bursitis of right hip   Current Dose per office note on 01/24/2023: not discussed  Okay to refill Baclofen?

## 2023-06-19 ENCOUNTER — Other Ambulatory Visit: Payer: Self-pay

## 2023-06-19 ENCOUNTER — Ambulatory Visit (INDEPENDENT_AMBULATORY_CARE_PROVIDER_SITE_OTHER): Payer: Medicare Other | Admitting: Internal Medicine

## 2023-06-19 DIAGNOSIS — J849 Interstitial pulmonary disease, unspecified: Secondary | ICD-10-CM

## 2023-06-19 LAB — PULMONARY FUNCTION TEST
DL/VA % pred: 84 %
DL/VA: 3.49 ml/min/mmHg/L
DLCO cor % pred: 82 %
DLCO cor: 15.01 ml/min/mmHg
DLCO unc % pred: 82 %
DLCO unc: 15.01 ml/min/mmHg
FEF 25-75 Pre: 2.86 L/sec
FEF2575-%Pred-Pre: 187 %
FEV1-%Pred-Pre: 117 %
FEV1-Pre: 2.27 L
FEV1FVC-%Pred-Pre: 113 %
FEV6-%Pred-Pre: 109 %
FEV6-Pre: 2.69 L
FEV6FVC-%Pred-Pre: 105 %
FVC-%Pred-Pre: 103 %
FVC-Pre: 2.69 L
Pre FEV1/FVC ratio: 85 %
Pre FEV6/FVC Ratio: 100 %

## 2023-06-19 NOTE — Patient Instructions (Signed)
Spiro/DLCO performed today.  

## 2023-06-19 NOTE — Progress Notes (Signed)
Spiro/DLCO performed today.  

## 2023-06-20 NOTE — Progress Notes (Signed)
Office Visit Note  Patient: Heather Lindsey             Date of Birth: Jan 03, 1946           MRN: [id number]             PCP: Chilton Greathouse, MD Referring: Chilton Greathouse, MD Visit Date: 06/29/2023 Occupation: @GUAROCC @  Subjective:  Overcrowding of toes in the right foot   History of Present Illness: Heather Lindsey is a 77 y.o. female with morphea and mild ILD.  Patient states she has not seen the dermatologist in a long time.  She has not noticed changes in the rash from morphea.  Although morphea is extensive and it is difficult for her to keep up.  Raynauds is currently not active.  She denies any history of digital ulcers.  She denies any shortness of breath.  She continues to have some stiffness in her joints and morning stiffness.  She describes discomfort in the trochanteric region.  She had good response to right trochanteric bursa injection in the past.  There is no history of sclerodactyly.  She was seen by Dr. Colletta Maryland who thought that her symptoms of ILD are related to reflux.  She is continues to get Prolia injections through Dr. Felipa Eth.  She has an appointment coming up with Dr. Felipa Eth next month and will be getting labs through his office.    Activities of Daily Living:  Patient reports morning stiffness for 2-3 hours.   Patient Reports nocturnal pain.  Difficulty dressing/grooming: Denies Difficulty climbing stairs: Reports Difficulty getting out of chair: Denies Difficulty using hands for taps, buttons, cutlery, and/or writing: Reports  Review of Systems  Constitutional:  Positive for fatigue.  HENT:  Positive for mouth dryness. Negative for mouth sores.   Eyes:  Positive for dryness.  Respiratory:  Negative for shortness of breath.   Cardiovascular:  Negative for chest pain and palpitations.  Gastrointestinal:  Negative for blood in stool, constipation and diarrhea.  Endocrine: Positive for increased urination.  Genitourinary:  Negative for involuntary urination.   Musculoskeletal:  Positive for joint pain, joint pain, myalgias, muscle weakness, morning stiffness, muscle tenderness and myalgias. Negative for gait problem and joint swelling.  Skin:  Negative for color change, rash, hair loss and sensitivity to sunlight.  Allergic/Immunologic: Negative for susceptible to infections.  Neurological:  Negative for dizziness and headaches.  Hematological:  Negative for swollen glands.  Psychiatric/Behavioral:  Negative for depressed mood and sleep disturbance. The patient is not nervous/anxious.     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   HYPERCHOLESTEROLEMIA 11/18/2008   Depression 11/18/2008   IRRITABLE BOWEL SYNDROME 11/18/2008   Morphea 11/18/2008   Primary osteoarthritis of both hands 11/18/2008   Age related osteoporosis 11/18/2008   MIGRAINES, HX OF 11/18/2008    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   Brain bleed (HCC)  Broken wrist    LEFT,2006   Cataract    BILATERAL NO SURGERY YET UPDATED 06/09/22   Circumscribed scleroderma    Depression    Diverticulosis    GERD (gastroesophageal reflux disease)    Hyperlipidemia    IBS (irritable bowel syndrome)    Memory loss    Migraines    Osteoarthritis    Osteoporosis    Stroke (HCC) 2015    Family History  Problem Relation Age of Onset   Heart disease Father     Liver cancer Paternal Aunt    Prostate cancer Paternal Grandfather    Colon cancer Neg Hx    Stomach cancer Neg Hx    Pancreatic cancer Neg Hx    Esophageal cancer Neg Hx    Rectal cancer Neg Hx    Colon polyps Neg Hx    Crohn's disease Neg Hx    Past Surgical History:  Procedure Laterality Date   ANKLE FRACTURE SURGERY Left    COLONOSCOPY     ESOPHAGEAL MANOMETRY N/A 12/29/2021   Procedure: ESOPHAGEAL MANOMETRY (EM);  Surgeon: Sherrilyn Rist, MD;  Location: [facility];  Service: Gastroenterology;  Laterality: N/A;  pt will stay on her PPI   ESOPHAGOGASTRODUODENOSCOPY N/A 02/24/2022   Procedure: ESOPHAGOGASTRODUODENOSCOPY (EGD);  Surgeon: Corliss Skains, MD;  Location: [facility] OR;  Service: Thoracic;  Laterality: N/A;   POLYPECTOMY     TOTAL HIP ARTHROPLASTY Left 01/06/2019   Procedure: TOTAL HIP ARTHROPLASTY ANTERIOR APPROACH;  Surgeon: Jodi Geralds, MD;  Location: [facility];  Service: Orthopedics;  Laterality: Left;   TUBAL LIGATION     XI ROBOTIC ASSISTED HIATAL HERNIA REPAIR N/A 02/24/2022   Procedure: XI ROBOTIC ASSISTED HIATAL HERNIA REPAIR with Fundoplication using Myriad Matrix;  Surgeon: Corliss Skains, MD;  Location: [facility] OR;  Service: Thoracic;  Laterality: N/A;   Social History   Social History Narrative   Daily caffeine, 2 glasses daily,   Right handed, Married, 2 sons. Retired. College.   Immunization History  Administered Date(s) Administered   Influenza Split 11/02/2009, 09/02/2010, 11/15/2011, 09/25/2012, 11/25/2013, 09/30/2014   Influenza, High Dose Seasonal PF 08/16/2017, 10/21/2022   Influenza, Quadrivalent, Recombinant, Inj, Pf 09/08/2018, 09/12/2019, 10/07/2020, 10/06/2021   Influenza,inj,Quad PF,6+ Mos 08/20/2015   PFIZER(Purple Top)SARS-COV-2 Vaccination 12/28/2019, 01/18/2020, 08/07/2020   Pneumococcal Conjugate-13 11/25/2013   Tdap 10/15/2007   Zoster Recombinant(Shingrix) 03/01/2018, 06/17/2018   Zoster, Live 11/02/2009, 04/16/2013      Objective: Vital Signs: BP 123/82 (BP Location: Left Arm, Patient Position: Sitting, Cuff Size: Small)   Pulse 76   Resp 12   Ht 5\' 3"  (1.6 m)   Wt 159 lb 12.8 oz (72.5 kg)   BMI 28.31 kg/m    Physical Exam Vitals and nursing note reviewed.  Constitutional:      Appearance: She is well-developed.  HENT:     Head: Normocephalic and atraumatic.  Eyes:     Conjunctiva/sclera: Conjunctivae normal.  Cardiovascular:     Rate and Rhythm: Normal rate and regular rhythm.     Heart sounds: Normal heart sounds.  Pulmonary:     Effort: Pulmonary effort is normal.     Breath sounds: Normal breath sounds.  Abdominal:     General: Bowel sounds are normal.     Palpations: Abdomen is soft.  Musculoskeletal:     Cervical back: Normal range of motion.  Lymphadenopathy:     Cervical: No cervical adenopathy.  Skin:    General: Skin is warm and dry.  Capillary Refill: Capillary refill takes less than 2 seconds.     Comments: Patient has extensive rash from morphea on her trunk and extremities.  No sclerodactyly, nailbed capillary changes, or digital ulcers were noted.  Neurological:     Mental Status: She is alert and oriented to person, place, and time.  Psychiatric:        Behavior: Behavior normal.      Musculoskeletal Exam: Cervical, thoracic and lumbar spine were in good range of motion.  Shoulders, elbows, wrist joints, MCPs PIPs and DIPs were in good range of motion.  She had mild PIP and DIP thickening.  No sclerodactyly or telangiectasia were noted.  No digital ulcers were noted.  Hip joints and knee joints were in good range of motion.  She had mild tenderness over right trochanteric bursa.  No warmth swelling or effusion was noted in the knee joints.  There was no tenderness over ankles or MTPs.  Overcrowding of toes at the right second and third toe was noted.  CDAI Exam: CDAI Score: -- Patient Global: --; Provider Global: -- Swollen: --; Tender: -- Joint Exam 06/29/2023    No joint exam has been documented for this visit   There is currently no information documented on the homunculus. Go to the Rheumatology activity and complete the homunculus joint exam.  Investigation: No additional findings.  Imaging: No results found.  Recent Labs: Lab Results  Component Value Date   WBC 11.2 (H) 02/25/2022   HGB 11.0 (L) 02/25/2022   PLT 218 02/25/2022   NA 135 02/25/2022   K 4.0 02/25/2022   CL 100 02/25/2022   CO2 25 02/25/2022   GLUCOSE 114 (H) 02/25/2022   BUN 8 02/25/2022   CREATININE 0.87 02/25/2022   BILITOT 0.4 02/22/2022   ALKPHOS 57 02/22/2022   AST 19 02/22/2022   ALT 15 02/22/2022   PROT 7.1 02/22/2022   ALBUMIN 3.6 02/22/2022   CALCIUM 8.3 (L) 02/25/2022   GFRAA 71 03/18/2021   IMPRESSION: 1. Unchanged mild pulmonary fibrosis in a pattern with apical to basal gradient, featuring irregular peripheral interstitial opacity, septal thickening, traction bronchiectasis, and minimal subpleural bronchiolectasis at the bilateral lung bases. 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 Am Rosezetta Schlatter Crit Care Med Vol 198, Iss 5, [id number], Aug 12 2017. 2. Small hiatal hernia.   Aortic Atherosclerosis (ICD10-I70.0).     Electronically Signed   By: Jearld Lesch M.D.   On: 07/21/2022 11:16 Speciality Comments: No specialty comments available.  Procedures:  No procedures performed Allergies: Actonel [risedronate], Alendronate, and Boniva [ibandronic acid]   Assessment / Plan:     Visit Diagnoses: Morphea -she has extensive rash from morphea on her trunk and extremities.  She has not noticed any change in the rash.  She was last seen by Dr. Reche Dixon on 10/31/18.     Not currently taking any immunosuppressive agents.  Abnormal chest CT - Chest CT on 01/27/22: Findings in the lungs are concerning for potential  interstitial lung disease.  Patient was evaluated by Dr. Colletta Maryland in December 2023 who mentioned that she has mild ILD most likely related to gastroesophageal reflux.  No treatment was advised.  High risk medication use - Discontinued methotrexate in 2020 due to the concern for immunosuppression during the covid-19 pandemic.  Raynaud's phenomenon without gangrene-currently not active.  No digital ulcers or sclerodactyly was noted.  Left hand paresthesia-resolved.  Primary osteoarthritis  of both hands-she has some PIP and DIP thickening.  Joint protection was discussed.  Status post total hip replacement, left-doing well with good range of motion.  Trochanteric bursitis of right hip-she could points to right trochanteric bursa injection in February 2024.  She states the pain has recurred.  IT band stretches were demonstrated and encouraged.  She close for water aerobics.  Age-related osteoporosis without current pathological fracture - updated DEXA on 10/11/21 per Dr. Vicente Males noted. [facility] updated at [facility].  She is on Prolia 60 mg sq injections every 6 months prescribed by Dr. Felipa Eth.  Patient has an appointment coming up with Dr. Loa Socks and will get lab work.  She will forward results to [place].  Hiatal hernia - Chest CT performed on 01/27/2022: Small hiatal hernia. Status post hiatal hernia repair on 02/24/2022 performed by Dr. Cliffton Asters.  Other medical problems are listed as follows:  History of IBS  History of hypercholesterolemia  History of depression  History of gastroesophageal reflux (GERD)  History of migraine  Orders: No orders of the defined types were placed in this encounter.  No orders of the defined types were placed in this encounter.   Follow-Up Instructions: Return in about 6 months (around 12/30/2023) for Morphea, OA.   Pollyann Savoy, MD  Note - This record has been created using Animal nutritionist.  Chart creation errors have been sought, but may not always  have been  located. Such creation errors do not reflect on  the standard of medical care.

## 2023-06-22 ENCOUNTER — Encounter: Payer: Self-pay | Admitting: Gastroenterology

## 2023-06-29 ENCOUNTER — Encounter: Payer: Self-pay | Admitting: Rheumatology

## 2023-06-29 ENCOUNTER — Ambulatory Visit: Payer: Medicare Other | Attending: Rheumatology | Admitting: Rheumatology

## 2023-06-29 VITALS — BP 123/82 | HR 76 | Resp 12 | Ht 63.0 in | Wt 159.8 lb

## 2023-06-29 DIAGNOSIS — R202 Paresthesia of skin: Secondary | ICD-10-CM

## 2023-06-29 DIAGNOSIS — Z79899 Other long term (current) drug therapy: Secondary | ICD-10-CM | POA: Diagnosis not present

## 2023-06-29 DIAGNOSIS — Z8659 Personal history of other mental and behavioral disorders: Secondary | ICD-10-CM

## 2023-06-29 DIAGNOSIS — M19042 Primary osteoarthritis, left hand: Secondary | ICD-10-CM

## 2023-06-29 DIAGNOSIS — M810 Age-related osteoporosis without current pathological fracture: Secondary | ICD-10-CM

## 2023-06-29 DIAGNOSIS — M7061 Trochanteric bursitis, right hip: Secondary | ICD-10-CM

## 2023-06-29 DIAGNOSIS — L940 Localized scleroderma [morphea]: Secondary | ICD-10-CM

## 2023-06-29 DIAGNOSIS — Z8639 Personal history of other endocrine, nutritional and metabolic disease: Secondary | ICD-10-CM

## 2023-06-29 DIAGNOSIS — Z96642 Presence of left artificial hip joint: Secondary | ICD-10-CM

## 2023-06-29 DIAGNOSIS — K449 Diaphragmatic hernia without obstruction or gangrene: Secondary | ICD-10-CM

## 2023-06-29 DIAGNOSIS — R9389 Abnormal findings on diagnostic imaging of other specified body structures: Secondary | ICD-10-CM | POA: Diagnosis not present

## 2023-06-29 DIAGNOSIS — Z8669 Personal history of other diseases of the nervous system and sense organs: Secondary | ICD-10-CM

## 2023-06-29 DIAGNOSIS — I7300 Raynaud's syndrome without gangrene: Secondary | ICD-10-CM

## 2023-06-29 DIAGNOSIS — Z8719 Personal history of other diseases of the digestive system: Secondary | ICD-10-CM

## 2023-06-29 DIAGNOSIS — M19041 Primary osteoarthritis, right hand: Secondary | ICD-10-CM

## 2023-06-29 DIAGNOSIS — G8929 Other chronic pain: Secondary | ICD-10-CM

## 2023-06-30 ENCOUNTER — Ambulatory Visit (INDEPENDENT_AMBULATORY_CARE_PROVIDER_SITE_OTHER): Payer: Medicare Other | Admitting: Gastroenterology

## 2023-06-30 ENCOUNTER — Encounter: Payer: Self-pay | Admitting: Gastroenterology

## 2023-06-30 VITALS — BP 100/70 | HR 74 | Ht 63.0 in | Wt 158.0 lb

## 2023-06-30 DIAGNOSIS — K219 Gastro-esophageal reflux disease without esophagitis: Secondary | ICD-10-CM

## 2023-06-30 DIAGNOSIS — R143 Flatulence: Secondary | ICD-10-CM

## 2023-06-30 DIAGNOSIS — R1319 Other dysphagia: Secondary | ICD-10-CM | POA: Diagnosis not present

## 2023-06-30 MED ORDER — PANTOPRAZOLE SODIUM 40 MG PO TBEC: 40.0000 mg | DELAYED_RELEASE_TABLET | Freq: Every day | ORAL | 3 refills | Status: DC

## 2023-06-30 NOTE — Progress Notes (Signed)
Chief Complaint: Dysphagia Primary GI MD: Dr. Myrtie Neither  HPI: 77 year old female with history of hiatal hernia s/p nissen fundoplication 02/2022 and other PMH listed below presents for evaluation of dysphagia.  Patient reports for the last two months she reports dysphagia with solids. She feels most times when she eats she will get food "hung up" in her suprasternal notch. No issue with liquids. Feels she has some GERD. Unsure if this started after her surgery, though she does note she has noticed it in the last 2 months. She is not on any antiacid medicine, but has used Tums with mild relief.  She also reports increased flatulence. Denies change in bowel habits.  Last seen 10/2022 by Dr. Myrtie Neither for hemorrhoidal banding.  PREVIOUS GI WORKUP   Colonoscopy 06/2022 for history of polyps: hemorrhoids, Three 2-8 mm tubular adenomas in proximal ascending colon. Diverticulosis and internal hemorrhoids.  Esophageal manometry 01/2022 was negative.  EGD 10/2021 for GERD: - LA Grade C reflux esophagitis with no bleeding.  - 3 cm hiatal hernia.  - Multiple fundic gland polyps.  - Normal examined duodenum.  - No specimens collected. 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   Brain bleed (HCC)    Broken wrist    LEFT,2006   Cataract    BILATERAL NO SURGERY YET UPDATED 06/09/22   Circumscribed scleroderma    Depression    Diverticulosis    GERD (gastroesophageal reflux disease)    Hyperlipidemia    IBS (irritable bowel syndrome)    Memory loss    Migraines    Osteoarthritis    Osteoporosis    Stroke (HCC) 2015    Past Surgical History:  Procedure Laterality Date   ANKLE FRACTURE SURGERY Left    COLONOSCOPY     ESOPHAGEAL MANOMETRY N/A 12/29/2021   Procedure: ESOPHAGEAL MANOMETRY (EM);  Surgeon: Sherrilyn Rist, MD;  Location: [facility];  Service:  Gastroenterology;  Laterality: N/A;  pt will stay on her PPI   ESOPHAGOGASTRODUODENOSCOPY N/A 02/24/2022   Procedure: ESOPHAGOGASTRODUODENOSCOPY (EGD);  Surgeon: Corliss Skains, MD;  Location: [facility] OR;  Service: Thoracic;  Laterality: N/A;   POLYPECTOMY     TOTAL HIP ARTHROPLASTY Left 01/06/2019   Procedure: TOTAL HIP ARTHROPLASTY ANTERIOR APPROACH;  Surgeon: Jodi Geralds, MD;  Location: [facility];  Service: Orthopedics;  Laterality: Left;   TUBAL LIGATION     XI ROBOTIC ASSISTED HIATAL HERNIA REPAIR N/A 02/24/2022   Procedure: XI ROBOTIC ASSISTED HIATAL HERNIA REPAIR with Fundoplication using Myriad Matrix;  Surgeon: Corliss Skains, MD;  Location: [facility] OR;  Service: Thoracic;  Laterality: N/A;    Current Outpatient Medications  Medication Sig Dispense Refill   ABILIFY 2 MG tablet Take 2 mg by mouth daily.     acetaminophen (TYLENOL) 325 MG tablet Take 2 tablets (650 mg total) by mouth every 6 (six) hours as needed for mild pain.     ALPRAZolam (XANAX) 0.5 MG tablet Take 0.25 mg by mouth at bedtime as needed for anxiety or sleep.     baclofen (LIORESAL) 10 MG tablet TAKE 1 TABLET BY MOUTH DAILY AS  NEEDED FOR MUSCLE SPASM(S) 60 tablet 0   carboxymethylcellulose (REFRESH PLUS) 0.5 % SOLN Place 1 drop into both eyes 3 (three) times daily as needed (dry eyes).     Cholecalciferol 50 MCG (  2000 UT) CAPS Take 2,000 Units by mouth daily.     denosumab (PROLIA) 60 MG/ML SOSY injection Inject 60 mg into the skin every 6 (six) months.     diclofenac Sodium (VOLTAREN) 1 % GEL Apply 1 application. topically daily as needed (pain).     fluticasone (FLONASE) 50 MCG/ACT nasal spray Place 1 spray into both nostrils daily.     hyoscyamine (LEVSIN) 0.125 MG tablet Take 0.125 mg by mouth every 4 (four) hours as needed for bladder spasms or cramping.     loratadine (CLARITIN) 10 MG tablet Take 10 mg by mouth daily as needed for allergies.     Multiple Vitamin (MULTI-VITAMIN DAILY PO) Take 1 tablet by mouth  daily.     Olopatadine HCl 0.2 % SOLN Place 1 drop into both eyes daily.     venlafaxine XR (EFFEXOR-XR) 150 MG 24 hr capsule Take 150 mg by mouth daily with breakfast.     No current facility-administered medications for this visit.    Allergies as of 06/30/2023 - Review Complete 06/30/2023  Allergen Reaction Noted   Actonel [risedronate]  10/13/2021   Alendronate Other (See Comments) 10/06/2021   Boniva [ibandronic acid]  10/13/2021    Family History  Problem Relation Age of Onset   Heart disease Father    Liver cancer Paternal Aunt    Prostate cancer Paternal Grandfather    Colon cancer Neg Hx    Stomach cancer Neg Hx    Pancreatic cancer Neg Hx    Esophageal cancer Neg Hx    Rectal cancer Neg Hx    Colon polyps Neg Hx    Crohn's disease Neg Hx     Social History   Socioeconomic History   Marital status: Married    Spouse name: Not on file   Number of children: Not on file   Years of education: Not on file   Highest education level: Not on file  Occupational History   Occupation: Retired   Tobacco Use   Smoking status: Former    Current packs/day: 0.00    Average packs/day: 0.5 packs/day for 4.0 years (2.0 ttl pk-yrs)    Types: Cigarettes    Start date: 1969    Quit date: 1973    Years since quitting: 51.5    Passive exposure: Past   Smokeless tobacco: Never  Vaping Use   Vaping status: Never Used  Substance and Sexual Activity   Alcohol use: Not Currently   Drug use: Never   Sexual activity: Not on file  Other Topics Concern   Not on file  Social History Narrative   Daily caffeine, 2 glasses daily,   Right handed, Married, 2 sons. Retired. College.   Social Determinants of Health   Financial Resource Strain: Not on file  Food Insecurity: Not on file  Transportation Needs: Not on file  Physical Activity: Not on file  Stress: Not on file  Social Connections: Not on file  Intimate Partner Violence: Not on file    Review of Systems:     Constitutional: No weight loss, fever, chills, weakness or fatigue HEENT: Eyes: No change in vision               Ears, Nose, Throat:  No change in hearing or congestion Skin: No rash or itching Cardiovascular: No chest pain, chest pressure or palpitations   Respiratory: No SOB or cough Gastrointestinal: See HPI and otherwise negative Genitourinary: No dysuria or change in urinary frequency Neurological: No headache, dizziness  or syncope Musculoskeletal: No new muscle or joint pain Hematologic: No bleeding or bruising Psychiatric: No history of depression or anxiety    Physical Exam:  Vital signs: BP 100/70   Pulse 74   Ht 5\' 3"  (1.6 m)   Wt 71.7 kg   BMI 27.99 kg/m   Constitutional: NAD, Well developed, Well nourished, alert and cooperative Head:  Normocephalic and atraumatic. Eyes:   PEERL, EOMI. No icterus. Conjunctiva pink. Respiratory: Respirations even and unlabored. Lungs clear to auscultation bilaterally.   No wheezes, crackles, or rhonchi.  Cardiovascular:  Regular rate and rhythm. No peripheral edema, cyanosis or pallor.  Gastrointestinal:  Soft, nondistended, nontender. No rebound or guarding. Normal bowel sounds. No appreciable masses or hepatomegaly. Rectal:  Not performed.  Msk:  Symmetrical without gross deformities. Without edema, no deformity or joint abnormality.  Neurologic:  Alert and  oriented x4;  grossly normal neurologically.  Skin:   Dry and intact without significant lesions or rashes. Psychiatric: Oriented to person, place and time. Demonstrates good judgement and reason without abnormal affect or behaviors.   RELEVANT LABS AND IMAGING: CBC    Component Value Date/Time   WBC 11.2 (H) 02/25/2022 0101   RBC 3.74 (L) 02/25/2022 0101   HGB 11.0 (L) 02/25/2022 0101   HCT 33.4 (L) 02/25/2022 0101   PLT 218 02/25/2022 0101   MCV 89.3 02/25/2022 0101   MCH 29.4 02/25/2022 0101   MCHC 32.9 02/25/2022 0101   RDW 12.6 02/25/2022 0101   LYMPHSABS  2,129 09/14/2021 1004   MONOABS 0.3 01/05/2019 1903   EOSABS 88 09/14/2021 1004   BASOSABS 38 09/14/2021 1004    CMP     Component Value Date/Time   NA 135 02/25/2022 0101   NA 136 11/27/2014 1444   K 4.0 02/25/2022 0101   CL 100 02/25/2022 0101   CO2 25 02/25/2022 0101   GLUCOSE 114 (H) 02/25/2022 0101   BUN 8 02/25/2022 0101   BUN 24 11/27/2014 1444   CREATININE 0.87 02/25/2022 0101   CREATININE 0.87 01/17/2022 1443   CALCIUM 8.3 (L) 02/25/2022 0101   PROT 7.1 02/22/2022 0919   ALBUMIN 3.6 02/22/2022 0919   AST 19 02/22/2022 0919   ALT 15 02/22/2022 0919   ALKPHOS 57 02/22/2022 0919   BILITOT 0.4 02/22/2022 0919   GFRNONAA >60 02/25/2022 0101   GFRNONAA 61 03/18/2021 0927   GFRAA 71 03/18/2021 0927     Assessment/Plan:   Gastroesophageal reflux disease, unspecified whether esophagitis present Other dysphagia Dysphagia to solids ongoing for 2 months. S/p nissen fundiplication 02/2022. Discussion with patient about conservative management versus repeat EGD versus barium swallow study. Patient elected for conservative management. DDX includes GERD, esophagitis, gastritis, globus sensation, PUD. Occasional NSAID - Pantoprazole 40mg  once daily - Educated patient on lifestyle modifications - If no improvement on pantoprazole, recommend barium swallow versus EGD. - follow up 3 months   Flatulence Discussed that Tums can cause gas and constipation - low FODMAP diet - Gas-x as needed   Lara Mulch [facility] 06/30/2023, 11:07 AM  Cc: Chilton Greathouse, MD

## 2023-06-30 NOTE — Progress Notes (Signed)
____________________________________________________________  Attending physician addendum:  Thank you for sending this case to me. I have reviewed the entire note and agree with the plan.  If she changes her mind about further workup, please get a barium esophagram.  Amada Jupiter, MD  ____________________________________________________________

## 2023-06-30 NOTE — Patient Instructions (Signed)
Go to [email or web address] for low FODMAP handout

## 2023-07-04 ENCOUNTER — Encounter: Payer: Self-pay | Admitting: Internal Medicine

## 2023-07-04 ENCOUNTER — Ambulatory Visit: Payer: Medicare Other | Admitting: Internal Medicine

## 2023-07-04 VITALS — BP 110/80 | HR 75 | Ht 63.0 in | Wt 158.8 lb

## 2023-07-04 DIAGNOSIS — J849 Interstitial pulmonary disease, unspecified: Secondary | ICD-10-CM

## 2023-07-04 DIAGNOSIS — Z7185 Encounter for immunization safety counseling: Secondary | ICD-10-CM | POA: Diagnosis not present

## 2023-07-04 DIAGNOSIS — Z8679 Personal history of other diseases of the circulatory system: Secondary | ICD-10-CM | POA: Diagnosis not present

## 2023-07-04 DIAGNOSIS — K449 Diaphragmatic hernia without obstruction or gangrene: Secondary | ICD-10-CM | POA: Diagnosis not present

## 2023-07-04 NOTE — Progress Notes (Signed)
OV 03/17/2022  Subjective:  Patient ID: Heather Lindsey, female , DOB: Dec 25, 1945 , age 77 y.o. , MRN: [id number] , ADDRESS: [address] [place] [address] PCP Chilton Greathouse, MD Patient Care Team: Chilton Greathouse, MD as PCP - General (Internal Medicine)  This Provider for this visit: Treatment Team:  Attending Provider: Kalman Shan, MD    03/17/2022 -   Chief Complaint  Patient presents with   Consult    Pt is being seen due to recent CT.  Pt has no current complaints.     HPI Heather Lindsey 77 y.o. -referred by primary care physician Dr. Felipa Eth for evaluation of possible interstitial lung disease.  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  She tells me that she has had acid reflux for the last several years.  She is also had previous Raynaud's but review the records indicate detailed connective tissue disease work-up is negative.  As part of this she was diagnosed have hiatal hernia and then she had a CT scan that showed significant hiatal hernia then on 02/24/2022 she underwent hiatal hernia surgery by Dr. Judi Cong.  Specifically surgery was status post esophagoscopy and hiatal hernia repair with [facility] admission nisin fundoplication.  Before the surgery she was having cough and acid reflux.  Both the symptoms have resolved.  She has never had dyspnea on exertion except when climbing heavy set of stairs this thing is still persistent.  And it is only mild.  Overall she is doing well.  But she is here for the ILD.  She does do water aerobics 3 times a week before the surgery and she never had any shortness of breath for that.  Specifically ILD question as below   [facility] Comprehensive ILD Questionnaire  Symptoms:      Past Medical History :  -Morphea sees rheumatology Acid reflux with hiatal hernia status post Nissen fundoplication mid March 2023 by Dr. Cliffton Asters Has osteoarthritis Has acid reflux controlled after hiatal hernia  surgery 2015 right frontal lobe brain bleed 1968 infectious mononucleosis Raynaud NOS   ROS:  Positive for fatigue and arthralgia.  Positive for dry eyes.  Positive for Raynaud's.  6 connective tissue disease autoimmune antibody panel negative.  Positive for acid reflux but now controlled after surgery  FAMILY HISTORY of LUNG DISEASE:  Denies family history of lung disease*  PERSONAL EXPOSURE HISTORY:  -Started smoking 1966.  Quit smoking 1971.  10 to 15 cigarettes a day.  No marijuana use no cocaine use.  No intravenous drug use  HOME  EXPOSURE and HOBBY DETAILS :  -[place].  The home itself is 76 years old she uses a steam iron but there is no mildew in it.  . Pt said that she used to have a cockatiel but he died years ago. Pt said that she does have a sofa that has feathers in it. ..  She does have feather pillow and sulfa  OCCUPATIONAL HISTORY (122 questions) : Detail organic and inorganic antigen exposure history is negative.  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  She has been on methotrexate for morphea 15 or 20 years ago and tries to avoid it.  She was on some prednisone 5 years ago.  INVESTIGATIONS:   She has had a CT chest with contrast: I personally visualized  this and showed it to her.  There is some ILD -there is a craniocaudal gradient.  It is very mild.  Is either postinflammatory or indeterminate for UIP pattern.  Any with a contrast CT was done in February 2023 before the hiatal hernia surgery   OV 08/18/2022  Subjective:  Patient ID: Heather Lindsey, female , DOB: 03/04/1946 , age 77 y.o. , MRN: [id number] , ADDRESS: [address] [place] [address] PCP Chilton Greathouse, MD Patient Care Team: Chilton Greathouse, MD as PCP - General (Internal Medicine)  This Provider for this visit: Treatment Team:   Attending Provider: Kalman Shan, MD    08/18/2022 -   Chief Complaint  Patient presents with   Follow-up    PFT performed today.  Pt denies any complaints of SOB. Does have an occasional cough.     HPI Heather Lindsey 77 y.o. -returns for follow-up.  She presents with her husband who is a retired Corporate investment banker person who is involved with hemophilia medications.  She continues to feel stable.  She has very minimal shortness of breath with exertion.  She has some cough just like last time but she tells me it is actually acute and she feels she has allergies for the last few days.  She has not tested herself for COVID.  Currently there is widespread COVID in the community.  There is no fever or chills or other COVID symptoms.  In any event she is here for her ILD work-up.  She is not on any proton pump inhibitors.  Her pulmonary function test essentially normal except for a very slight reduction in DLCO.  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  Her serologic work-up was negative.    HRCT 07/20/22  Narrative & Impression  CLINICAL DATA:  Interstitial lung disease, former smoker   EXAM: CT CHEST WITHOUT CONTRAST   TECHNIQUE: Multidetector CT imaging of the chest was performed following the standard protocol without intravenous contrast. High resolution imaging of the lungs, as well as inspiratory and expiratory imaging, was performed.   RADIATION DOSE REDUCTION: This exam was performed according to the departmental dose-optimization program which includes automated exposure control, adjustment of the mA and/or kV according to patient size and/or use of iterative reconstruction technique.   COMPARISON:  01/27/2022   FINDINGS: Cardiovascular: Scattered aortic atherosclerosis. Normal heart size. No pericardial effusion.   Mediastinum/Nodes: No enlarged mediastinal, hilar, or axillary  lymph nodes. Small hiatal hernia. Thyroid gland, trachea, and esophagus demonstrate no significant findings.   Lungs/Pleura: Unchanged mild pulmonary fibrosis in a pattern with apical to basal gradient, featuring irregular peripheral interstitial opacity, septal thickening, traction bronchiectasis, and minimal subpleural bronchiolectasis at the bilateral lung bases. No significant air trapping on expiratory phase imaging. No pleural effusion or pneumothorax.   Upper Abdomen: No acute abnormality.   Musculoskeletal: No chest wall abnormality. No acute osseous findings.   IMPRESSION: 1. Unchanged mild pulmonary fibrosis in a pattern with apical to basal gradient, featuring irregular peripheral interstitial opacity, septal thickening, traction bronchiectasis, and minimal subpleural bronchiolectasis at the bilateral lung bases. 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 Am Rosezetta Schlatter Crit Care Med Vol 198, Iss 5, [phone], Aug 12 2017. 2. Small hiatal hernia.   Aortic Atherosclerosis (ICD10-I70.0).     Electronically Signed   By: Jearld Lesch M.D.   On: 07/21/2022 11:16  IMMUNOLOGY   Latest Reference Range & Units 03/02/22 13:38  Anti Nuclear Antibody (ANA) NEGATIVE  NEGATIVE  ANCA SCREEN Negative  Negative  Cyclic Citrullin Peptide Ab UNITS <16  ds DNA Ab IU/mL <1  Myeloperoxidase Abs <1.0 AI <1.0  Serine Protease 3 <1.0 AI <1.0  RA Latex Turbid. <14 IU/mL <14  ENA SM Ab Ser-aCnc <1.0 NEG AI <1.0 NEG  C3 Complement 83 - 193 mg/dL 295 (H)  C4 Complement 15 - 57 mg/dL 41  Ribonucleic Protein(ENA) Antibody, IgG <1.0 NEG AI <1.0 NEG  SSA (Ro) (ENA) Antibody, IgG <1.0 NEG AI <1.0 NEG  SSB (La) (ENA) Antibody, IgG <1.0 NEG AI <1.0 NEG  Scleroderma (Scl-70) (ENA) Antibody, IgG <1.0 NEG AI <1.0 NEG  (H): Data is abnormally high    OV  11/17/2022  Subjective:  Patient ID: Heather Lindsey, female , DOB: [birth year] , age 36 y.o. , MRN: [id number] , ADDRESS: [address] [place] [address] PCP Chilton Greathouse, MD Patient Care Team: Chilton Greathouse, MD as PCP - General (Internal Medicine)  This Provider for this visit: Treatment Team:  Attending Provider: Kalman Shan, MD    11/17/2022 -   Chief Complaint  Patient presents with   Follow-up    PFT performed today.  Pt states she has been doing okay since last visit and denies any complaints.     HPI Heather Lindsey 77 y.o. -returns for follow-up.  Since her last visit she did have COVID but she is doing well overall.  Symptom score is stable very minimal symptoms dyspnea score is just 1.  Her pulmonary function test shows stability/improvement.  Walking desaturation test was stable except for some tachycardia with walking.  There are no new complaints.  She has gotten rid of feather pillow.  Acid reflux under control.  She wants to have her Pneumovax vaccine.  She is only had Prevnar according to the chart.  She specifically asked for Pneumovax.  WE do not have stock   OV 07/04/2023  Subjective:  Patient ID: Heather Lindsey, female , DOB: April 09, 1946 , age 77 y.o. , MRN: [id number] , ADDRESS: [address] [place] [address] PCP Chilton Greathouse, MD Patient Care Team: Chilton Greathouse, MD as PCP - General (Internal Medicine)  This Provider for this visit: Treatment Team:  Attending Provider: Kalman Shan, MD    07/04/2023 -   Chief Complaint  Patient presents with   Follow-up    6 month f/up, no complaints   Mild interstitial lung disease in the setting of bird for the exposure, hiatal hernia status post Nissen and also Raynaud's Las CT Aug 2023 On expectant followup  HPI Heather Lindsey 77 y.o. -she has negative serology from a year ago.  Did see Dr. Corliss Skains 06/29/2023 for rheumatology consult.  Diagnosed to have morphea.  Raynaud's  acknowledged.  Also followed up with GI PA on 06/30/2023.  At that time she reported dysphagia for 2 months.  They recommended barium swallow if no improvement with some lifestyle modifications and taking PPI.  From a respiratory standpoint she continues to be stable.  No new symptoms. Interim Health status: No new complaints No new medical problems. No new surgeries. No ER visits. No Urgent care visits. No changes to medications.  Pulmonary function test appears stable today.     SYMPTOM SCALE - ILD 03/17/2022 08/18/2022  11/17/2022  07/04/2023   Current weight      O2 use ra ra ra ra  Shortness of Breath 0 ->  5 scale with 5 being worst (score 6 If unable to do)     At rest 0 0 0 0  Simple tasks - showers, clothes change, eating, shaving 0 00 0 0  Household (dishes, doing bed, laundry) 0 0 0 0  Shopping 0 0 0 0  Walking level at own pace 0 0 0 0  Walking up Stairs 1.5 1 1 2   Total (30-36) Dyspnea Score 1.5 1 1 2   How bad is your cough? 2 2 0 0  How bad is your fatigue 3 3 2  2.5  How bad is nausea 0 0 0 0  How bad is vomiting?  0 0 0 0  How bad is diarrhea? 0 0 0 0  How bad is anxiety? 3 2.5 1.5 2  How bad is depression 3 2 1.5 "SAD" wintder 1  Any chronic pain - if so where and how bad 3 x  x       Simple office walk 185 feet x  3 laps goal with forehead probe 03/17/2022  11/17/2022   O2 used ra ra  Number laps completed 3 3  Comments about pace avg   Resting Pulse Ox/HR 100% and 79/min 100% and HR 86  Final Pulse Ox/HR 100% and 1051/min 100% and HR 115  Desaturated </= 88% no non  Desaturated <= 3% points no no  Got Tachycardic >/= 90/min yes yes  Symptoms at end of test No complaitns No compalitns  Miscellaneous comments x x     PFT     Latest Ref Rng & Units 06/19/2023    7:27 AM 11/17/2022    1:01 PM 08/18/2022    8:57 AM  ILD indicators  FVC-Pre L 2.69  P 2.93  2.88   FVC-Predicted Pre % 103  P 111  109   FVC-Post L   2.83   FVC-Predicted Post %   107   TLC L    4.91   TLC Predicted %   101   DLCO uncorrected ml/min/mmHg 15.01  P 14.26  12.89   DLCO UNC %Pred % 82  P 78  71   DLCO Corrected ml/min/mmHg 15.01  P 14.26  12.89   DLCO COR %Pred % 82  P 78  71     P Preliminary result      LAB RESULTS last 96 hours No results found.  LAB RESULTS last 90 days Recent Results (from the past 2160 hour(s))  Pulmonary function test     Status: None (Preliminary result)   Collection Time: 06/19/23  7:27 AM  Result Value Ref Range   FVC-Pre 2.69 L   FVC-%Pred-Pre 103 %   FEV1-Pre 2.27 L   FEV1-%Pred-Pre 117 %   FEV6-Pre 2.69 L   FEV6-%Pred-Pre 109 %   Pre FEV1/FVC ratio 85 %   FEV1FVC-%Pred-Pre 113 %   Pre FEV6/FVC Ratio 100 %   FEV6FVC-%Pred-Pre 105 %   FEF 25-75 Pre 2.86 L/sec   FEF2575-%Pred-Pre 187 %   DLCO unc 15.01 ml/min/mmHg   DLCO unc % pred 82 %   DLCO cor 15.01 ml/min/mmHg   DLCO cor % pred 82 %   DL/VA 1.61 ml/min/mmHg/L   DL/VA % pred 84 %         has a past medical history of Allergy, Anxiety, 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   reports that she quit smoking about 51 years ago. Her smoking use included cigarettes. She started smoking about 55 years ago. She has a 2 pack-year smoking history. She has been exposed to tobacco smoke. She has never used smokeless tobacco.  Past Surgical History:  Procedure Laterality Date   ANKLE FRACTURE SURGERY Left    COLONOSCOPY     ESOPHAGEAL MANOMETRY N/A 12/29/2021   Procedure: ESOPHAGEAL MANOMETRY (EM);  Surgeon: Sherrilyn Rist, MD;  Location: [facility];  Service: Gastroenterology;  Laterality: N/A;  pt will stay on her PPI   ESOPHAGOGASTRODUODENOSCOPY N/A 02/24/2022   Procedure:  ESOPHAGOGASTRODUODENOSCOPY (EGD);  Surgeon: Corliss Skains, MD;  Location: [facility] OR;  Service: Thoracic;  Laterality: N/A;   POLYPECTOMY     TOTAL HIP ARTHROPLASTY Left 01/06/2019   Procedure: TOTAL HIP ARTHROPLASTY ANTERIOR APPROACH;  Surgeon: Jodi Geralds, MD;  Location: [facility];  Service: Orthopedics;  Laterality: Left;   TUBAL LIGATION     XI ROBOTIC ASSISTED HIATAL HERNIA REPAIR N/A 02/24/2022   Procedure: XI ROBOTIC ASSISTED HIATAL HERNIA REPAIR with Fundoplication using Myriad Matrix;  Surgeon: Corliss Skains, MD;  Location: [facility] OR;  Service: Thoracic;  Laterality: N/A;    Allergies  Allergen Reactions   Actonel [Risedronate]     Severe chest pain   Alendronate Other (See Comments)    Severe chest pain   Boniva [Ibandronic Acid]     Chest pain    Immunization History  Administered Date(s) Administered   Influenza Split 11/02/2009, 09/02/2010, 11/15/2011, 09/25/2012, 11/25/2013, 09/30/2014   Influenza, High Dose Seasonal PF 08/16/2017, 10/21/2022   Influenza, Quadrivalent, Recombinant, Inj, Pf 09/08/2018, 09/12/2019, 10/07/2020, 10/06/2021   Influenza,inj,Quad PF,6+ Mos 08/20/2015   PFIZER(Purple Top)SARS-COV-2 Vaccination 12/28/2019, 01/18/2020, 08/07/2020   Pneumococcal Conjugate-13 11/25/2013   Tdap 10/15/2007   Zoster Recombinant(Shingrix) 03/01/2018, 06/17/2018   Zoster, Live 11/02/2009, 04/16/2013    Family History  Problem Relation Age of Onset   Heart disease Father    Liver cancer Paternal Aunt    Prostate cancer Paternal Grandfather    Colon cancer Neg Hx    Stomach cancer Neg Hx    Pancreatic cancer Neg Hx    Esophageal cancer Neg Hx    Rectal cancer Neg Hx    Colon polyps Neg Hx    Crohn's disease Neg Hx      Current Outpatient Medications:    ABILIFY 2 MG tablet, Take 2 mg by mouth daily., Disp: , Rfl:    acetaminophen (TYLENOL) 325 MG tablet, Take 2 tablets (650 mg total) by mouth every 6 (six) hours as needed for mild pain., Disp: , Rfl:     ALPRAZolam (XANAX) 0.5 MG tablet, Take 0.25 mg by mouth at bedtime as needed for anxiety or sleep., Disp: , Rfl:    baclofen (LIORESAL) 10 MG tablet, TAKE 1 TABLET BY MOUTH DAILY AS  NEEDED FOR MUSCLE SPASM(S), Disp: 60 tablet, Rfl: 0   carboxymethylcellulose (REFRESH PLUS) 0.5 % SOLN, Place 1 drop into both eyes 3 (three) times daily as needed (dry eyes)., Disp: , Rfl:    Cholecalciferol 50 MCG (2000 UT) CAPS, Take 2,000 Units by mouth daily., Disp: , Rfl:    denosumab (PROLIA) 60 MG/ML SOSY injection, Inject 60 mg into the skin every 6 (six) months., Disp: , Rfl:    diclofenac Sodium (VOLTAREN) 1 % GEL, Apply 1 application. topically daily as needed (pain)., Disp: , Rfl:    fluticasone (  FLONASE) 50 MCG/ACT nasal spray, Place 1 spray into both nostrils daily., Disp: , Rfl:    hyoscyamine (LEVSIN) 0.125 MG tablet, Take 0.125 mg by mouth every 4 (four) hours as needed for bladder spasms or cramping., Disp: , Rfl:    loratadine (CLARITIN) 10 MG tablet, Take 10 mg by mouth daily as needed for allergies., Disp: , Rfl:    Multiple Vitamin (MULTI-VITAMIN DAILY PO), Take 1 tablet by mouth daily., Disp: , Rfl:    Olopatadine HCl 0.2 % SOLN, Place 1 drop into both eyes daily., Disp: , Rfl:    pantoprazole (PROTONIX) 40 MG tablet, Take 1 tablet (40 mg total) by mouth daily., Disp: 90 tablet, Rfl: 3   venlafaxine XR (EFFEXOR-XR) 150 MG 24 hr capsule, Take 150 mg by mouth daily with breakfast., Disp: , Rfl:       Objective:   Vitals:   07/04/23 1101  BP: 110/80  Pulse: 75  SpO2: 95%  Weight: 158 lb 12.8 oz (72 kg)  Height: 5\' 3"  (1.6 m)    Estimated body mass index is 28.13 kg/m as calculated from the following:   Height as of this encounter: 5\' 3"  (1.6 m).   Weight as of this encounter: 158 lb 12.8 oz (72 kg).  @WEIGHTCHANGE @  [employer]   07/04/23 1101  Weight: 158 lb 12.8 oz (72 kg)     Physical Exam   General: No distress. Looks well O2 at rest: no Cane present: no Sitting in  wheel chair: no Frail: no Obese: no Neuro: Alert and Oriented x 3. GCS 15. Speech normal Psych: Pleasant Resp:  Barrel Chest - no.  Wheeze - no, Crackles - unclear if presetn, No overt respiratory distress CVS: Normal heart sounds. Murmurs - no Ext: Stigmata of Connective Tissue Disease - no HEENT: Normal upper airway. PEERL +. No post nasal drip        Assessment:       ICD-10-CM   1. ILD (interstitial lung disease) (HCC)  J84.9     2. Hiatal hernia  K44.9     3. History of Raynaud's syndrome  Z86.79     4. Vaccine counseling  Z71.85          Plan:     Patient Instructions  ILD (interstitial lung disease) (HCC) Hiatal hernia Status post Nissen fundoplication History of Raynaud's syndrome   - mild/early ILD since feb 2023 in setting of hiatal hernia and GERD; - unclear if this is IPF v non-IPF;   - -s/p Nissen in March 2023 and glad you got rid of feather pillos - stable disease 07/04/2023 with last CT aug 2023  plan - given stability (and controlling risk - gerd and feather pillow)  will monitor you closely instead of biopsy or anti-fibrotic -  6months do spiomtery and dlco -get RSV vaccine in fall - get high dose flu shot in fall    Followup - 6 months but after spirome /dlco  - symptoms score and walk test at followup  - can decide HRCT followup at time of next visit   FOLLOWUP Return in about 6 months (around 01/04/2024) for 15 min visit, after Cleda Daub and DLCO, ILD, with Dr Marchelle Gearing, Face to Face Visit.    SIGNATURE    Dr. Kalman Shan, M.D., F.C.C.P,  Pulmonary and Critical Care Medicine Staff Physician, [facility] Director - Interstitial Lung Disease  Program  Pulmonary Fibrosis [facility] at [facility] [place], [place], [address]  Pager: [phone], If no answer or between  15:00h - 7:00h: call [phone] Telephone: [phone]  11:32 AM 07/04/2023

## 2023-07-04 NOTE — Patient Instructions (Addendum)
ILD (interstitial lung disease) (HCC) Hiatal hernia Status post Nissen fundoplication History of Raynaud's syndrome   - mild/early ILD since feb 2023 in setting of hiatal hernia and GERD; - unclear if this is IPF v non-IPF;   - -s/p Nissen in March 2023 and glad you got rid of feather pillos - stable disease 07/04/2023 with last CT aug 2023  plan - given stability (and controlling risk - gerd and feather pillow)  will monitor you closely instead of biopsy or anti-fibrotic -  6months do spiomtery and dlco -get RSV vaccine in fall - get high dose flu shot in fall    Followup - 6 months but after spirome /dlco  - symptoms score and walk test at followup  - can decide HRCT followup at time of next visit

## 2023-09-14 ENCOUNTER — Other Ambulatory Visit: Payer: Self-pay | Admitting: Rheumatology

## 2023-09-14 NOTE — Telephone Encounter (Signed)
Last Fill: 04/06/2023  Next Visit: 01/03/2024  Last Visit: 06/29/2023  Dx:  Trochanteric bursitis of right hip   Current Dose per office note on 06/29/2023: not discussed  Okay to refill Baclofen?

## 2023-12-20 NOTE — Progress Notes (Unsigned)
Office Visit Note  Patient: Heather Lindsey             Date of Birth: 06/03/1946           MRN: [id number]             PCP: Chilton Greathouse, MD Referring: Chilton Greathouse, MD Visit Date: 01/03/2024 Occupation: @GUAROCC @  Subjective:  Right index finger pain   History of Present Illness: Heather Lindsey is a 78 y.o. female with history of morphea and osteoarthritis.  Patient was previously evaluated by Dr. Reche Dixon in 2019 but has not followed up since.  She has been off of methotrexate since 2020.  Patient continues to have morphea on her torso and extremities but has not developed any new lesions.  Patient states that her symptoms of Raynaud's phenomenon have been well-controlled.  She denies any increase skin tightness or thickening.  She is not needing to take amlodipine at this time.  Patient presents today with some increased pain in her hands due to underlying osteoarthritis specifically in her right index finger.  She has noticed intermittent inflammation in her right index finger.  She is been using Voltaren gel topically as needed for pain relief.  She has also tried taking ibuprofen as needed and baclofen as needed.  Patient reports that she was evaluated by Dr. Luiz Blare for right hip and sciatica pain.  Patient states that she had a cortisone injection which was helpful as well as completed physical therapy.  She has continued home exercises and has not had a recurrence of sciatica. Patient remains on Prolia 60 mg sq injections every 6 months ordered by her PCP. She remains under the care of Dr. Marchelle Gearing and is scheduled for updated PFTs tomorrow.   She denies any new medical conditions.     Activities of Daily Living:  Patient reports morning stiffness for 24 minutes.   Patient Denies nocturnal pain.  Difficulty dressing/grooming: Denies Difficulty climbing stairs: Denies Difficulty getting out of chair: Denies Difficulty using hands for taps, buttons, cutlery, and/or writing:  Reports  Review of Systems  Constitutional:  Positive for fatigue.  HENT:  Positive for mouth dryness. Negative for mouth sores.   Eyes:  Positive for dryness.  Respiratory: Negative.  Negative for shortness of breath.   Cardiovascular: Negative.  Negative for chest pain and palpitations.  Gastrointestinal: Negative.  Negative for blood in stool, constipation and diarrhea.  Endocrine: Negative.  Negative for increased urination.  Genitourinary: Negative.  Negative for involuntary urination.  Musculoskeletal:  Positive for joint pain, joint pain, joint swelling and morning stiffness. Negative for gait problem, myalgias, muscle weakness, muscle tenderness and myalgias.  Skin: Negative.  Negative for color change, rash, hair loss and sensitivity to sunlight.  Allergic/Immunologic: Negative.  Negative for susceptible to infections.  Neurological:  Positive for headaches. Negative for dizziness.  Hematological: Negative.  Negative for swollen glands.  Psychiatric/Behavioral:  Positive for sleep disturbance. Negative for depressed mood. The patient is not nervous/anxious.     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   HYPERCHOLESTEROLEMIA 11/18/2008   Depression 11/18/2008   IRRITABLE BOWEL SYNDROME 11/18/2008   Morphea 11/18/2008   Primary osteoarthritis of both hands 11/18/2008   Age related osteoporosis 11/18/2008   MIGRAINES, HX OF 11/18/2008    Past Medical History:   Diagnosis Date   Allergy    SEASONAL   Anxiety    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   Brain bleed (HCC)    Broken wrist    LEFT,2006   Cataract    BILATERAL NO SURGERY YET UPDATED 06/09/22   Circumscribed scleroderma    Depression    Diverticulosis    GERD (gastroesophageal reflux disease)    Hyperlipidemia    IBS (irritable bowel syndrome)    Memory loss    Migraines    Osteoarthritis    Osteoporosis    Stroke (HCC) 2015    Family History  Problem Relation Age of Onset   Heart disease Father    Liver cancer Paternal Aunt    Prostate cancer Paternal Grandfather    Colon cancer Neg Hx    Stomach cancer Neg Hx    Pancreatic cancer Neg Hx    Esophageal cancer Neg Hx    Rectal cancer Neg Hx    Colon polyps Neg Hx    Crohn's disease Neg Hx    Past Surgical History:  Procedure Laterality Date   ANKLE FRACTURE SURGERY Left    COLONOSCOPY     ESOPHAGEAL MANOMETRY N/A 12/29/2021   Procedure: ESOPHAGEAL MANOMETRY (EM);  Surgeon: Sherrilyn Rist, MD;  Location: [facility];  Service: Gastroenterology;  Laterality: N/A;  pt will stay on her PPI   ESOPHAGOGASTRODUODENOSCOPY N/A 02/24/2022   Procedure: ESOPHAGOGASTRODUODENOSCOPY (EGD);  Surgeon: Corliss Skains, MD;  Location: [facility] OR;  Service: Thoracic;  Laterality: N/A;   POLYPECTOMY     TOTAL HIP ARTHROPLASTY Left 01/06/2019   Procedure: TOTAL HIP ARTHROPLASTY ANTERIOR APPROACH;  Surgeon: Jodi Geralds, MD;  Location: [facility];  Service: Orthopedics;  Laterality: Left;   TUBAL LIGATION     XI ROBOTIC ASSISTED HIATAL HERNIA REPAIR N/A 02/24/2022   Procedure: XI ROBOTIC ASSISTED HIATAL HERNIA REPAIR with Fundoplication using Myriad Matrix;  Surgeon: Corliss Skains, MD;  Location: [facility] OR;  Service: Thoracic;  Laterality: N/A;   Social History   Social History Narrative   Daily caffeine, 2 glasses daily,   Right handed, Married, 2 sons. Retired. College.   Immunization History   Administered Date(s) Administered   Influenza Split 11/02/2009, 09/02/2010, 11/15/2011, 09/25/2012, 11/25/2013, 09/30/2014   Influenza, High Dose Seasonal PF 08/16/2017, 10/21/2022   Influenza, Quadrivalent, Recombinant, Inj, Pf 09/08/2018, 09/12/2019, 10/07/2020, 10/06/2021   Influenza,inj,Quad PF,6+ Mos 08/20/2015   PFIZER(Purple Top)SARS-COV-2 Vaccination 12/28/2019, 01/18/2020, 08/07/2020   Pneumococcal Conjugate-13 11/25/2013   Tdap 10/15/2007   Zoster Recombinant(Shingrix) 03/01/2018, 06/17/2018   Zoster, Live 11/02/2009, 04/16/2013     Objective: Vital Signs: BP 121/80   Pulse 77   Resp 16   Ht 5\' 3"  (1.6 m)   Wt 162 lb 6.4 oz (73.7 kg)   BMI 28.77 kg/m    Physical Exam Vitals and nursing note reviewed.  Constitutional:      Appearance: She is well-developed.  HENT:     Head: Normocephalic and atraumatic.  Eyes:     Conjunctiva/sclera: Conjunctivae normal.  Cardiovascular:     Rate and Rhythm: Normal rate and regular rhythm.     Heart sounds: Normal heart sounds.  Pulmonary:     Effort: Pulmonary effort is normal.     Breath sounds: Normal breath sounds.  Abdominal:     General: Bowel sounds are normal.     Palpations: Abdomen is soft.  Musculoskeletal:     Cervical back: Normal range of motion.  Skin:    General: Skin is warm and dry.     Capillary Refill: Capillary refill takes less than 2 seconds.     Comments: Morphea on torso and extremities.  No sclerodactyly, nailbed capillary changes, or digital ulcers were noted.   Neurological:     Mental Status: She is alert and oriented to person, place, and time.  Psychiatric:        Behavior: Behavior normal.      Musculoskeletal Exam: C-spine, thoracic spine, lumbar spine and good range of motion.  Shoulder joints, elbow joints, wrist joints and MCPs, PIPs, DIPs have good range of motion with no synovitis.  Patient has tenderness over the right second PIP joint and the DIP joint.  Mild PIP and DIP thickening  noted.  Mild CMC joint prominence noted bilaterally.  Complete fist formation noted.  Left hip replacement has good range of motion.  Right hip joint has good range of motion with no groin pain.  Mild tenderness palpation over the right trochanteric bursa.  Knee joints have good range of motion no warmth or effusion.  Ankle joints have good range of motion with no tenderness or joint swelling.  CDAI Exam: CDAI Score: -- Patient Global: --; Provider Global: -- Swollen: --; Tender: -- Joint Exam 01/03/2024   No joint exam has been documented for this visit   There is currently no information documented on the homunculus. Go to the Rheumatology activity and complete the homunculus joint exam.  Investigation: No additional findings.  Imaging: No results found.  Recent Labs: Lab Results  Component Value Date   WBC 11.2 (H) 02/25/2022   HGB 11.0 (L) 02/25/2022   PLT 218 02/25/2022   NA 135 02/25/2022   K 4.0 02/25/2022   CL 100 02/25/2022   CO2 25 02/25/2022   GLUCOSE 114 (H) 02/25/2022   BUN 8 02/25/2022   CREATININE 0.87 02/25/2022   BILITOT 0.4 02/22/2022   ALKPHOS 57 02/22/2022   AST 19 02/22/2022   ALT 15 02/22/2022   PROT 7.1 02/22/2022   ALBUMIN 3.6 02/22/2022   CALCIUM 8.3 (L) 02/25/2022   GFRAA 71 03/18/2021    Speciality Comments: No specialty comments available.  Procedures:  No procedures performed Allergies: Actonel [risedronate], Alendronate, and Boniva [ibandronic acid]   Assessment / Plan:     Visit Diagnoses: Morphea - Last seen by Dr. Reche Dixon on 10/31/18: Morphea on torso and extremities-unchanged. No new lesions.  Not currently taking any immunosuppressive agents.  She discontinued methotrexate in 2020 and has not noticed any new or worsening symptoms. No signs of sclerodactyly were noted on examination today.  She has not had any recent symptoms of Raynaud's phenomenon.  Patient was advised to notify us if she develops any new or worsening symptoms.  She  will follow-up in the office in 6 months or sooner if needed.  High risk medication use - Discontinued methotrexate in 2020 due to the concern for immunosuppression during the covid-19 pandemic.  She does not require any suppressive therapy at this time.  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  Not currently active.  No digital ulcerations or signs of sclerodactyly noted.  Not currently taking amlodipine.  Left hand paresthesia:  Resolved.  No recurrence.  Primary osteoarthritis of both hands: Mild CMC, PIP, DIP thickening consistent with osteoarthritis of both hands.  Patient has been experiencing increased pain and stiffness involving the right index finger primarily in her PIP and DIP joint.  Different treatment options for management of osteoarthritic pain was discussed today in detail including hand therapy, hand exercises, paraffin wax, arthritis compression gloves, Voltaren gel, and natural anti-inflammatories.  Patient was given a list of natural anti-inflammatories to start taking.  She will notify us if she develops any new or worsening symptoms.  Status post total hip replacement, left: Doing well.  No groin pain.  Trochanteric bursitis of right hip: Patient had an injection performed by Dr. Luiz Blare.  She has also completed physical therapy.  She has been trying to continue home exercises.  Abnormal chest CT - Chest CT on 01/27/22: Findings in the lungs are concerning for potential ILD.  High-resolution chest CT updated on 07/20/2022: Unchanged mild pulmonary fibrosis and pattern with apical to basal gradient, featuring irregular peripheral interstitial opacity, septal thickening, traction bronchiectasis, and minimal subpleural bronchiectasis at the bilateral lung bases. Reviewed Dr. 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   Recommended updating PFTs--scheduled tomorrow  Age-related osteoporosis  without current pathological fracture - updated DEXA on 10/11/21 per Dr. Vicente Males noted. DEXAs updated at [facility].  She is on Prolia 60 mg sq injections every 6 months prescribed by Dr. Felipa Eth.   Other medical conditions are listed as follows:   Hiatal hernia - Chest CT performed on 01/27/2022: Small hiatal hernia. Status post hiatal hernia repair on 02/24/2022 performed by Dr. Cliffton Asters.  History of IBS  History of gastroesophageal reflux (GERD)  History of hypercholesterolemia  History of depression  History of migraine  Orders: No orders of the defined types were placed in this encounter.  No orders of the defined types were placed in this encounter.   Follow-Up Instructions: Return in about 6 months (around 07/02/2024).   Gearldine Bienenstock, PA-C  Note - This record has been created using Dragon software.  Chart creation errors have been sought, but may not always  have been located. Such creation errors do not reflect on  the standard of medical care.

## 2024-01-03 ENCOUNTER — Ambulatory Visit: Payer: Medicare Other | Attending: Physician Assistant | Admitting: Physician Assistant

## 2024-01-03 ENCOUNTER — Encounter: Payer: Self-pay | Admitting: Physician Assistant

## 2024-01-03 VITALS — BP 121/80 | HR 77 | Resp 16 | Ht 63.0 in | Wt 162.4 lb

## 2024-01-03 DIAGNOSIS — M19042 Primary osteoarthritis, left hand: Secondary | ICD-10-CM

## 2024-01-03 DIAGNOSIS — Z8719 Personal history of other diseases of the digestive system: Secondary | ICD-10-CM

## 2024-01-03 DIAGNOSIS — L940 Localized scleroderma [morphea]: Secondary | ICD-10-CM

## 2024-01-03 DIAGNOSIS — I7300 Raynaud's syndrome without gangrene: Secondary | ICD-10-CM | POA: Diagnosis not present

## 2024-01-03 DIAGNOSIS — M19041 Primary osteoarthritis, right hand: Secondary | ICD-10-CM

## 2024-01-03 DIAGNOSIS — Z79899 Other long term (current) drug therapy: Secondary | ICD-10-CM

## 2024-01-03 DIAGNOSIS — R202 Paresthesia of skin: Secondary | ICD-10-CM | POA: Diagnosis not present

## 2024-01-03 DIAGNOSIS — Z8639 Personal history of other endocrine, nutritional and metabolic disease: Secondary | ICD-10-CM

## 2024-01-03 DIAGNOSIS — M810 Age-related osteoporosis without current pathological fracture: Secondary | ICD-10-CM

## 2024-01-03 DIAGNOSIS — R9389 Abnormal findings on diagnostic imaging of other specified body structures: Secondary | ICD-10-CM

## 2024-01-03 DIAGNOSIS — Z96642 Presence of left artificial hip joint: Secondary | ICD-10-CM

## 2024-01-03 DIAGNOSIS — Z8659 Personal history of other mental and behavioral disorders: Secondary | ICD-10-CM

## 2024-01-03 DIAGNOSIS — M7061 Trochanteric bursitis, right hip: Secondary | ICD-10-CM

## 2024-01-03 DIAGNOSIS — Z8669 Personal history of other diseases of the nervous system and sense organs: Secondary | ICD-10-CM

## 2024-01-03 DIAGNOSIS — K449 Diaphragmatic hernia without obstruction or gangrene: Secondary | ICD-10-CM

## 2024-01-04 ENCOUNTER — Ambulatory Visit: Payer: Medicare Other | Admitting: Internal Medicine

## 2024-01-04 DIAGNOSIS — J849 Interstitial pulmonary disease, unspecified: Secondary | ICD-10-CM

## 2024-01-04 LAB — PULMONARY FUNCTION TEST
DL/VA % pred: 74 %
DL/VA: 3.1 ml/min/mmHg/L
DLCO cor % pred: 74 %
DLCO cor: 13.48 ml/min/mmHg
DLCO unc % pred: 74 %
DLCO unc: 13.48 ml/min/mmHg
FEF 25-75 Pre: 2.43 L/s
FEF2575-%Pred-Pre: 159 %
FEV1-%Pred-Pre: 118 %
FEV1-Pre: 2.29 L
FEV1FVC-%Pred-Pre: 107 %
FEV6-%Pred-Pre: 115 %
FEV6-Pre: 2.85 L
FEV6FVC-%Pred-Pre: 105 %
FVC-%Pred-Pre: 109 %
FVC-Pre: 2.85 L
Pre FEV1/FVC ratio: 80 %
Pre FEV6/FVC Ratio: 100 %

## 2024-01-04 NOTE — Patient Instructions (Signed)
Spirometry/DLCO performed today. 

## 2024-01-04 NOTE — Progress Notes (Signed)
Spirometry/DLCO performed today. 

## 2024-01-15 ENCOUNTER — Emergency Department (HOSPITAL_BASED_OUTPATIENT_CLINIC_OR_DEPARTMENT_OTHER): Payer: Medicare Other

## 2024-01-15 ENCOUNTER — Observation Stay (HOSPITAL_BASED_OUTPATIENT_CLINIC_OR_DEPARTMENT_OTHER)
Admission: EM | Admit: 2024-01-15 | Discharge: 2024-01-17 | Disposition: A | Discharge status: Home / Self Care | Payer: Medicare Other | Attending: Emergency Medicine | Admitting: Emergency Medicine

## 2024-01-15 ENCOUNTER — Emergency Department (HOSPITAL_BASED_OUTPATIENT_CLINIC_OR_DEPARTMENT_OTHER): Payer: Medicare Other | Admitting: Radiology

## 2024-01-15 ENCOUNTER — Encounter (HOSPITAL_BASED_OUTPATIENT_CLINIC_OR_DEPARTMENT_OTHER): Payer: Self-pay | Admitting: Emergency Medicine

## 2024-01-15 ENCOUNTER — Other Ambulatory Visit: Payer: Self-pay

## 2024-01-15 DIAGNOSIS — Z87891 Personal history of nicotine dependence: Secondary | ICD-10-CM | POA: Insufficient documentation

## 2024-01-15 DIAGNOSIS — K219 Gastro-esophageal reflux disease without esophagitis: Secondary | ICD-10-CM | POA: Insufficient documentation

## 2024-01-15 DIAGNOSIS — E876 Hypokalemia: Secondary | ICD-10-CM | POA: Insufficient documentation

## 2024-01-15 DIAGNOSIS — R059 Cough, unspecified: Secondary | ICD-10-CM | POA: Diagnosis present

## 2024-01-15 DIAGNOSIS — Z79899 Other long term (current) drug therapy: Secondary | ICD-10-CM | POA: Insufficient documentation

## 2024-01-15 DIAGNOSIS — J101 Influenza due to other identified influenza virus with other respiratory manifestations: Principal | ICD-10-CM | POA: Diagnosis present

## 2024-01-15 DIAGNOSIS — J189 Pneumonia, unspecified organism: Secondary | ICD-10-CM | POA: Insufficient documentation

## 2024-01-15 DIAGNOSIS — Z20822 Contact with and (suspected) exposure to covid-19: Secondary | ICD-10-CM | POA: Insufficient documentation

## 2024-01-15 DIAGNOSIS — E871 Hypo-osmolality and hyponatremia: Secondary | ICD-10-CM | POA: Diagnosis not present

## 2024-01-15 DIAGNOSIS — R519 Headache, unspecified: Secondary | ICD-10-CM | POA: Diagnosis present

## 2024-01-15 DIAGNOSIS — J111 Influenza due to unidentified influenza virus with other respiratory manifestations: Principal | ICD-10-CM

## 2024-01-15 LAB — URINALYSIS, ROUTINE W REFLEX MICROSCOPIC
Bilirubin Urine: NEGATIVE
Glucose, UA: NEGATIVE mg/dL
Hgb urine dipstick: NEGATIVE
Ketones, ur: 15 mg/dL — AB
Leukocytes,Ua: NEGATIVE
Nitrite: NEGATIVE
Protein, ur: NEGATIVE mg/dL
Specific Gravity, Urine: 1.011 (ref 1.005–1.030)
pH: 7 (ref 5.0–8.0)

## 2024-01-15 LAB — CBC
HCT: 36.7 % (ref 36.0–46.0)
Hemoglobin: 12.5 g/dL (ref 12.0–15.0)
MCH: 29.3 pg (ref 26.0–34.0)
MCHC: 34.1 g/dL (ref 30.0–36.0)
MCV: 86.2 fL (ref 80.0–100.0)
Platelets: 194 10*3/uL (ref 150–400)
RBC: 4.26 MIL/uL (ref 3.87–5.11)
RDW: 12.2 % (ref 11.5–15.5)
WBC: 5.6 10*3/uL (ref 4.0–10.5)
nRBC: 0 % (ref 0.0–0.2)

## 2024-01-15 LAB — COMPREHENSIVE METABOLIC PANEL
ALT: 12 U/L (ref 0–44)
AST: 19 U/L (ref 15–41)
Albumin: 4.1 g/dL (ref 3.5–5.0)
Alkaline Phosphatase: 52 U/L (ref 38–126)
Anion gap: 11 (ref 5–15)
BUN: 8 mg/dL (ref 8–23)
CO2: 23 mmol/L (ref 22–32)
Calcium: 8.7 mg/dL — ABNORMAL LOW (ref 8.9–10.3)
Chloride: 86 mmol/L — ABNORMAL LOW (ref 98–111)
Creatinine, Ser: 0.7 mg/dL (ref 0.44–1.00)
GFR, Estimated: >60 mL/min (ref >60)
Glucose, Bld: 170 mg/dL — ABNORMAL HIGH (ref 70–99)
Potassium: 3.8 mmol/L (ref 3.5–5.1)
Sodium: 120 mmol/L — ABNORMAL LOW (ref 135–145)
Total Bilirubin: 0.4 mg/dL (ref 0.0–1.2)
Total Protein: 7 g/dL (ref 6.5–8.1)

## 2024-01-15 LAB — RESP PANEL BY RT-PCR (RSV, FLU A&B, COVID)  RVPGX2
Influenza A by PCR: POSITIVE — AB
Influenza B by PCR: NEGATIVE
Resp Syncytial Virus by PCR: NEGATIVE
SARS Coronavirus 2 by RT PCR: NEGATIVE

## 2024-01-15 LAB — CBG MONITORING, ED: Glucose-Capillary: 114 mg/dL — ABNORMAL HIGH (ref 70–99)

## 2024-01-15 LAB — LIPASE, BLOOD: Lipase: 24 U/L (ref 11–51)

## 2024-01-15 MED ORDER — SODIUM CHLORIDE 0.9 % IV SOLN
2.0000 g | Freq: Once | INTRAVENOUS | Status: AC
  Administered 2024-01-15: 2 g via INTRAVENOUS
  Filled 2024-01-15: qty 20

## 2024-01-15 MED ORDER — SODIUM CHLORIDE 0.9 % IV BOLUS
1000.0000 mL | Freq: Once | INTRAVENOUS | Status: AC
Start: 2024-01-15 — End: 2024-01-15
  Administered 2024-01-15: 1000 mL via INTRAVENOUS

## 2024-01-15 MED ORDER — ONDANSETRON HCL 4 MG/2ML IJ SOLN
4.0000 mg | Freq: Once | INTRAMUSCULAR | Status: AC
  Administered 2024-01-15: 4 mg via INTRAVENOUS
  Filled 2024-01-15: qty 2

## 2024-01-15 MED ORDER — SODIUM CHLORIDE 0.9 % IV SOLN
500.0000 mg | Freq: Once | INTRAVENOUS | Status: AC
  Administered 2024-01-15: 500 mg via INTRAVENOUS
  Filled 2024-01-15: qty 5

## 2024-01-15 NOTE — ED Notes (Signed)
Pt c/o of being nauseous, EDP made aware

## 2024-01-15 NOTE — ED Notes (Signed)
Pt is pleasantly confused, pt's spouse says she has been confused x 2 days and has had cough, HA and congestion x 3 days

## 2024-01-15 NOTE — Progress Notes (Signed)
Plan of Care Note for accepted transfer   Patient: Heather Lindsey MRN: [id number]   DOA: 01/15/2024  [facility]: [facility] Requesting Provider: Dr. Adela Lank Reason for transfer: flu Facility course:  78 year old woman presenting with confusion.  Found to have hyponatremia, influenza and referred for observation.  Plan of care: The patient is accepted for admission to Telemetry unit, at [facility]..  Patient remains under Dr. Lanetta Inch care until arrival to [facility].  Author: Brendia Sacks, MD 01/15/2024  Check [email or web address] for on-call coverage.  Nursing staff, Please call [facility] Admits & Consults System-Wide number on Amion as soon as patient's arrival, so appropriate admitting provider can evaluate the pt.

## 2024-01-15 NOTE — ED Triage Notes (Signed)
Pt bib husband, endorses "severe" cough and congestion with HA x 3 days pta. Also c/o n/v, "confusion" today. Took phenergan pta.

## 2024-01-15 NOTE — ED Notes (Signed)
Lab called to add lipase 

## 2024-01-15 NOTE — ED Provider Notes (Signed)
[place] EMERGENCY DEPARTMENT AT [facility] Provider Note   CSN: [id number] Arrival date & time: 01/15/24  1313     History  Chief Complaint  Patient presents with   Cough   Altered Mental Status    Heather Lindsey is a 78 y.o. female.  This is a 78 year old female presenting emergency department for confusion in the setting of nausea vomiting diarrhea cough for the past 3 days.  Reports that they she is seemingly been more confused.  Felt lightheaded earlier.  Husband notes that confusion has improved, but still not at baseline.   Cough Altered Mental Status      Home Medications Prior to Admission medications   Medication Sig Start Date End Date Taking? Authorizing Provider  ABILIFY 2 MG tablet Take 2 mg by mouth daily. 10/31/22   [provider]  acetaminophen (TYLENOL) 325 MG tablet Take 2 tablets (650 mg total) by mouth every 6 (six) hours as needed for mild pain. 02/25/22   Ardelle Balls, PA-C  ALPRAZolam Prudy Feeler) 0.5 MG tablet Take 0.25 mg by mouth at bedtime as needed for anxiety or sleep. 06/05/15   [provider]  baclofen (LIORESAL) 10 MG tablet TAKE 1 TABLET BY MOUTH DAILY AS  NEEDED FOR MUSCLE SPASM 09/14/23   Gearldine Bienenstock, PA-C  buPROPion [facility]) 100 MG tablet Take 100 mg by mouth daily.    [provider]  carboxymethylcellulose (REFRESH PLUS) 0.5 % SOLN Place 1 drop into both eyes 3 (three) times daily as needed (dry eyes).    [provider]  Cholecalciferol 50 MCG (2000 UT) CAPS Take 2,000 Units by mouth daily. 11/25/13   [provider]  denosumab (PROLIA) 60 MG/ML SOSY injection Inject 60 mg into the skin every 6 (six) months.    [provider]  diclofenac Sodium (VOLTAREN) 1 % GEL Apply 1 application. topically daily as needed (pain).    [provider]  fluticasone (FLONASE) 50 MCG/ACT nasal spray Place 1 spray into both nostrils daily.    [provider]   hyoscyamine (LEVSIN) 0.125 MG tablet Take 0.125 mg by mouth every 4 (four) hours as needed for bladder spasms or cramping.    [provider]  loratadine (CLARITIN) 10 MG tablet Take 10 mg by mouth daily as needed for allergies.    [provider]  Multiple Vitamin (MULTI-VITAMIN DAILY PO) Take 1 tablet by mouth daily. 10/20/09   [provider]  Olopatadine HCl 0.2 % SOLN Place 1 drop into both eyes daily.    [provider]  pantoprazole (PROTONIX) 40 MG tablet Take 1 tablet (40 mg total) by mouth daily. 06/30/23   McMichael, Saddie Benders, PA-C  venlafaxine XR (EFFEXOR-XR) 150 MG 24 hr capsule Take 150 mg by mouth daily with breakfast.    [provider]      Allergies    Actonel [risedronate], Alendronate, and Boniva [ibandronic acid]    Review of Systems   Review of Systems  Respiratory:  Positive for cough.     Physical Exam Updated Vital Signs BP (!) 142/75   Pulse 92   Temp 98.5 F (36.9 C) (Oral)   Resp 18   Wt 72.6 kg   SpO2 100%   BMI 28.34 kg/m  Physical Exam Vitals and nursing note reviewed.  Constitutional:      General: She is not in acute distress.    Appearance: She is not toxic-appearing.  HENT:     Head: Normocephalic.  Nose: Nose normal.     Mouth/Throat:     Mouth: Mucous membranes are dry.  Cardiovascular:     Rate and Rhythm: Normal rate.  Pulmonary:     Effort: Pulmonary effort is normal.     Breath sounds: Normal breath sounds.  Abdominal:     General: Abdomen is flat. There is no distension.     Tenderness: There is no abdominal tenderness. There is no guarding or rebound.  Musculoskeletal:        General: Normal range of motion.  Skin:    General: Skin is warm and dry.     Capillary Refill: Capillary refill takes less than 2 seconds.  Neurological:     Mental Status: She is alert and oriented to person, place, and time.     Cranial Nerves: No cranial nerve deficit.     Sensory: No sensory  deficit.     Motor: No weakness.     Coordination: Coordination normal.  Psychiatric:        Mood and Affect: Mood normal.        Behavior: Behavior normal.     ED Results / Procedures / Treatments   Labs (all labs ordered are listed, but only abnormal results are displayed) Labs Reviewed  RESP PANEL BY RT-PCR (RSV, FLU A&B, COVID)  RVPGX2 - Abnormal; Notable for the following components:      Result Value   Influenza A by PCR POSITIVE (*)    All other components within normal limits  COMPREHENSIVE METABOLIC PANEL - Abnormal; Notable for the following components:   Sodium 120 (*)    Chloride 86 (*)    Glucose, Bld 170 (*)    Calcium 8.7 (*)    All other components within normal limits  CBC  URINALYSIS, ROUTINE W REFLEX MICROSCOPIC  LIPASE, BLOOD  CBG MONITORING, ED    EKG EKG Interpretation Date/Time:  Monday January 15 2024 14:27:10 EST Ventricular Rate:  95 PR Interval:    QRS Duration:  76 QT Interval:  356 QTC Calculation: 447 R Axis:   58  Text Interpretation: Normal sinus rhythm Cannot rule out Anterior infarct , age undetermined Abnormal ECG background noise TECHNICALLY DIFFICULT Otherwise no significant change Confirmed by Melene Plan [phone]) on 01/15/2024 2:59:35 PM  Radiology No results found.  Procedures Procedures    Medications Ordered in ED Medications  sodium chloride 0.9 % bolus 1,000 mL (1,000 mLs Intravenous New Bag/Given 01/15/24 1556)    ED Course/ Medical Decision Making/ A&P Clinical Course as of 01/15/24 1608  Mon Jan 15, 2024  1541 Comprehensive metabolic panel(!) Patient with hyponatremia; suspect hypovolemic given her history of nausea vomiting diarrhea for the past several days.  Normal saline ordered.  No AKI.  No transaminitis to suggest hepatobiliary disease or hepatic encephalopathy. [TY]  1541 CBC No leukocytosis to suggest systemic infection.  No anemia [TY]  1541 Influenza A By PCR(!): POSITIVE This would explain patient's  nausea vomiting diarrhea, could also explain her delirium.  She is moving her neck freely with no signs of meningeal signs.  Low suspicion for meningitis [TY]    Clinical Course User Index [TY] Coral Spikes, DO                                 Medical Decision Making This is a 78 year old female presenting emergency department for viral syndrome type complaints with delirium type picture.  She is  afebrile nontachycardic, is hypertensive.  Maintaining ox saturation on room air.  On exam she is alert and oriented x 3, but does seemingly have some cognitive slowing.  She does answer questions appropriately however.  Husband states that she typically is more sharp.  She has no localizing neurodeficits on physical exam.  CT head ordered given report of altered mental status.  Pending.  She is flu positive and has hyponatremia.  Receiving IV fluids.  Will need admission.  Care signed out to Dr. Adela Lank  Amount and/or Complexity of Data Reviewed Independent Historian: spouse    Details: See above External Data Reviewed:     Details: She does take Xanax and baclofen and Wellbutrin which could also explain her confusion, but I suspect symptoms today secondary to secondary to flu Labs: ordered. Decision-making details documented in ED Course. Radiology: ordered. ECG/medicine tests: ordered.  Risk Decision regarding hospitalization.         Final Clinical Impression(s) / ED Diagnoses Final diagnoses:  Influenza  Hyponatremia    Rx / DC Orders ED Discharge Orders     None         Coral Spikes, DO 01/15/24 1608

## 2024-01-15 NOTE — ED Provider Notes (Signed)
Received patient in turnover from Dr. Maple Hudson.  Please see their note for further details of Hx, PE.  Briefly patient is a 78 y.o. female with a Cough and Altered Mental Status .  Going on for about 3 to 4 days.  Nausea vomiting diarrhea as well.  Found to be influenza A positive here.  Patient has had some improvement with IV fluids but continues to be off of her baseline.  Sodium of 120.  Plan for admission..  Chest x-ray is concerning for possible pneumonia.  Likely due to the flu but started on antimicrobials.    Melene Plan, DO 01/15/24 [phone]

## 2024-01-16 DIAGNOSIS — E871 Hypo-osmolality and hyponatremia: Secondary | ICD-10-CM | POA: Diagnosis not present

## 2024-01-16 DIAGNOSIS — Z87891 Personal history of nicotine dependence: Secondary | ICD-10-CM | POA: Diagnosis not present

## 2024-01-16 DIAGNOSIS — J101 Influenza due to other identified influenza virus with other respiratory manifestations: Secondary | ICD-10-CM | POA: Diagnosis not present

## 2024-01-16 DIAGNOSIS — E876 Hypokalemia: Secondary | ICD-10-CM | POA: Diagnosis not present

## 2024-01-16 DIAGNOSIS — Z20822 Contact with and (suspected) exposure to covid-19: Secondary | ICD-10-CM | POA: Diagnosis not present

## 2024-01-16 DIAGNOSIS — R059 Cough, unspecified: Secondary | ICD-10-CM | POA: Diagnosis present

## 2024-01-16 DIAGNOSIS — Z79899 Other long term (current) drug therapy: Secondary | ICD-10-CM | POA: Diagnosis not present

## 2024-01-16 DIAGNOSIS — J189 Pneumonia, unspecified organism: Secondary | ICD-10-CM | POA: Diagnosis not present

## 2024-01-16 DIAGNOSIS — K219 Gastro-esophageal reflux disease without esophagitis: Secondary | ICD-10-CM | POA: Diagnosis not present

## 2024-01-16 LAB — COMPREHENSIVE METABOLIC PANEL
ALT: 10 U/L (ref 0–44)
AST: 18 U/L (ref 15–41)
Albumin: 3.9 g/dL (ref 3.5–5.0)
Alkaline Phosphatase: 47 U/L (ref 38–126)
Anion gap: 10 (ref 5–15)
BUN: 6 mg/dL — ABNORMAL LOW (ref 8–23)
CO2: 24 mmol/L (ref 22–32)
Calcium: 8.2 mg/dL — ABNORMAL LOW (ref 8.9–10.3)
Chloride: 93 mmol/L — ABNORMAL LOW (ref 98–111)
Creatinine, Ser: 0.6 mg/dL (ref 0.44–1.00)
GFR, Estimated: >60 mL/min (ref >60)
Glucose, Bld: 106 mg/dL — ABNORMAL HIGH (ref 70–99)
Potassium: 3.5 mmol/L (ref 3.5–5.1)
Sodium: 127 mmol/L — ABNORMAL LOW (ref 135–145)
Total Bilirubin: 0.4 mg/dL (ref 0.0–1.2)
Total Protein: 6.4 g/dL — ABNORMAL LOW (ref 6.5–8.1)

## 2024-01-16 LAB — BASIC METABOLIC PANEL
Anion gap: 11 (ref 5–15)
Anion gap: 10 (ref 5–15)
Anion gap: 8 (ref 5–15)
Anion gap: 11 (ref 5–15)
BUN: 7 mg/dL — ABNORMAL LOW (ref 8–23)
BUN: 6 mg/dL — ABNORMAL LOW (ref 8–23)
BUN: 7 mg/dL — ABNORMAL LOW (ref 8–23)
BUN: 12 mg/dL (ref 8–23)
CO2: 23 mmol/L (ref 22–32)
CO2: 22 mmol/L (ref 22–32)
CO2: 24 mmol/L (ref 22–32)
CO2: 22 mmol/L (ref 22–32)
Calcium: 8.3 mg/dL — ABNORMAL LOW (ref 8.9–10.3)
Calcium: 8.2 mg/dL — ABNORMAL LOW (ref 8.9–10.3)
Calcium: 8 mg/dL — ABNORMAL LOW (ref 8.9–10.3)
Calcium: 8.1 mg/dL — ABNORMAL LOW (ref 8.9–10.3)
Chloride: 92 mmol/L — ABNORMAL LOW (ref 98–111)
Chloride: 91 mmol/L — ABNORMAL LOW (ref 98–111)
Chloride: 93 mmol/L — ABNORMAL LOW (ref 98–111)
Chloride: 96 mmol/L — ABNORMAL LOW (ref 98–111)
Creatinine, Ser: 0.59 mg/dL (ref 0.44–1.00)
Creatinine, Ser: 0.59 mg/dL (ref 0.44–1.00)
Creatinine, Ser: 0.64 mg/dL (ref 0.44–1.00)
Creatinine, Ser: 0.58 mg/dL (ref 0.44–1.00)
GFR, Estimated: >60 mL/min (ref >60)
GFR, Estimated: >60 mL/min (ref >60)
GFR, Estimated: >60 mL/min (ref >60)
GFR, Estimated: >60 mL/min (ref >60)
Glucose, Bld: 114 mg/dL — ABNORMAL HIGH (ref 70–99)
Glucose, Bld: 131 mg/dL — ABNORMAL HIGH (ref 70–99)
Glucose, Bld: 129 mg/dL — ABNORMAL HIGH (ref 70–99)
Glucose, Bld: 102 mg/dL — ABNORMAL HIGH (ref 70–99)
Potassium: 3.5 mmol/L (ref 3.5–5.1)
Potassium: 3.6 mmol/L (ref 3.5–5.1)
Potassium: 3.1 mmol/L — ABNORMAL LOW (ref 3.5–5.1)
Potassium: 3.3 mmol/L — ABNORMAL LOW (ref 3.5–5.1)
Sodium: 126 mmol/L — ABNORMAL LOW (ref 135–145)
Sodium: 123 mmol/L — ABNORMAL LOW (ref 135–145)
Sodium: 125 mmol/L — ABNORMAL LOW (ref 135–145)
Sodium: 129 mmol/L — ABNORMAL LOW (ref 135–145)

## 2024-01-16 LAB — NA AND K (SODIUM & POTASSIUM), RAND UR
Potassium Urine: 21 mmol/L
Sodium, Ur: 80 mmol/L

## 2024-01-16 LAB — OSMOLALITY, URINE: Osmolality, Ur: 277 mosm/kg — ABNORMAL LOW (ref 300–900)

## 2024-01-16 LAB — OSMOLALITY: Osmolality: 273 mosm/kg — ABNORMAL LOW (ref 275–295)

## 2024-01-16 MED ORDER — ONDANSETRON HCL 4 MG/2ML IJ SOLN: 4.0000 mg | Freq: Four times a day (QID) | INTRAMUSCULAR | Status: DC | PRN

## 2024-01-16 MED ORDER — OSELTAMIVIR PHOSPHATE 75 MG PO CAPS
75.0000 mg | ORAL_CAPSULE | Freq: Two times a day (BID) | ORAL | Status: DC
  Administered 2024-01-16 – 2024-01-17 (×3): 75 mg via ORAL
  Filled 2024-01-16 (×3): qty 1

## 2024-01-16 MED ORDER — ARIPIPRAZOLE 2 MG PO TABS
2.0000 mg | ORAL_TABLET | Freq: Every day | ORAL | Status: DC
Start: 2024-01-17 — End: 2024-01-17
  Administered 2024-01-17: 2 mg via ORAL
  Filled 2024-01-16: qty 1

## 2024-01-16 MED ORDER — ALPRAZOLAM 0.25 MG PO TABS: 0.2500 mg | ORAL_TABLET | Freq: Every evening | ORAL | Status: DC | PRN

## 2024-01-16 MED ORDER — SODIUM CHLORIDE 0.9 % IV SOLN
1.0000 g | INTRAVENOUS | Status: DC
  Administered 2024-01-16: 1 g via INTRAVENOUS
  Filled 2024-01-16: qty 10

## 2024-01-16 MED ORDER — ENOXAPARIN SODIUM 40 MG/0.4ML IJ SOSY
40.0000 mg | PREFILLED_SYRINGE | INTRAMUSCULAR | Status: DC
  Administered 2024-01-16: 40 mg via SUBCUTANEOUS
  Filled 2024-01-16: qty 0.4

## 2024-01-16 MED ORDER — ACETAMINOPHEN 325 MG PO TABS
650.0000 mg | ORAL_TABLET | Freq: Once | ORAL | Status: AC
Start: 2024-01-16 — End: 2024-01-16
  Administered 2024-01-16: 650 mg via ORAL
  Filled 2024-01-16: qty 2

## 2024-01-16 MED ORDER — ACETAMINOPHEN 650 MG RE SUPP: 650.0000 mg | Freq: Four times a day (QID) | RECTAL | Status: DC | PRN

## 2024-01-16 MED ORDER — SODIUM CHLORIDE 0.9 % IV SOLN
500.0000 mg | INTRAVENOUS | Status: DC
  Administered 2024-01-16: 500 mg via INTRAVENOUS
  Filled 2024-01-16: qty 5

## 2024-01-16 MED ORDER — BACLOFEN 10 MG PO TABS: 10.0000 mg | ORAL_TABLET | Freq: Every day | ORAL | Status: DC | PRN

## 2024-01-16 MED ORDER — ACETAMINOPHEN 325 MG PO TABS: 650.0000 mg | ORAL_TABLET | Freq: Four times a day (QID) | ORAL | Status: DC | PRN

## 2024-01-16 MED ORDER — BUPROPION HCL 100 MG PO TABS: 100.0000 mg | ORAL_TABLET | Freq: Every day | ORAL | Status: DC

## 2024-01-16 MED ORDER — PANTOPRAZOLE SODIUM 40 MG PO TBEC
40.0000 mg | DELAYED_RELEASE_TABLET | Freq: Every day | ORAL | Status: DC
  Administered 2024-01-16 – 2024-01-17 (×2): 40 mg via ORAL
  Filled 2024-01-16 (×2): qty 1

## 2024-01-16 MED ORDER — FLUTICASONE PROPIONATE 50 MCG/ACT NA SUSP
1.0000 | Freq: Every day | NASAL | Status: DC
  Administered 2024-01-16 – 2024-01-17 (×2): 1 via NASAL
  Filled 2024-01-16: qty 16

## 2024-01-16 MED ORDER — SODIUM CHLORIDE 0.9 % IV BOLUS
1000.0000 mL | Freq: Once | INTRAVENOUS | Status: AC
  Administered 2024-01-16: 1000 mL via INTRAVENOUS

## 2024-01-16 MED ORDER — ONDANSETRON HCL 4 MG PO TABS: 4.0000 mg | ORAL_TABLET | Freq: Four times a day (QID) | ORAL | Status: DC | PRN

## 2024-01-16 MED ORDER — SODIUM CHLORIDE 0.9 % IV BOLUS
250.0000 mL | Freq: Once | INTRAVENOUS | Status: AC
  Administered 2024-01-16: 250 mL via INTRAVENOUS

## 2024-01-16 MED ORDER — SODIUM CHLORIDE 0.9 % IV BOLUS: 500.0000 mL | Freq: Once | INTRAVENOUS | Status: DC

## 2024-01-16 MED ORDER — VENLAFAXINE HCL ER 150 MG PO CP24
150.0000 mg | ORAL_CAPSULE | Freq: Every day | ORAL | Status: DC
  Administered 2024-01-17: 150 mg via ORAL
  Filled 2024-01-16: qty 1

## 2024-01-16 MED ORDER — ALBUTEROL SULFATE (2.5 MG/3ML) 0.083% IN NEBU: 2.5000 mg | INHALATION_SOLUTION | RESPIRATORY_TRACT | Status: DC | PRN

## 2024-01-16 MED ORDER — POTASSIUM CHLORIDE CRYS ER 20 MEQ PO TBCR
40.0000 meq | EXTENDED_RELEASE_TABLET | Freq: Once | ORAL | Status: AC
  Administered 2024-01-16: 40 meq via ORAL
  Filled 2024-01-16: qty 2

## 2024-01-16 NOTE — ED Notes (Signed)
[facility] arrived at bedside. RN attempted to call accepting unit for report. No answer on phone call.

## 2024-01-16 NOTE — ED Provider Notes (Signed)
 1:00 AM Admitted patient boarding overnight  She has the flu, nausea vomiting diarrhea, confusion.  Found to have hyponatremia 120. Repeat CMP resulted with Na now at 127, her baseline is around 135 Will repeat in 3 hours to assess for ongoing change. She received 1L of NS at 1718 yestd.  Hold any further fluids at this time  6:50 AM Rpt Na 126 Will give NS bolus Rpt BMP @ 0900 Goal <46mmol/L in 24 hrs Mental status unchanged Continue plan for admission      CRITICAL CARE Performed by: Jayson DELENA Pereyra   Total critical care time: 30 minutes  Critical care time was exclusive of separately billable procedures and treating other patients.  Critical care was necessary to treat or prevent imminent or life-threatening deterioration.  Critical care was time spent personally by me on the following activities: development of treatment plan with patient and/or surrogate as well as nursing, discussions with consultants, evaluation of patient's response to treatment, examination of patient, obtaining history from patient or surrogate, ordering and performing treatments and interventions, ordering and review of laboratory studies, ordering and review of radiographic studies, pulse oximetry and re-evaluation of patient's condition. hyponatremia     Pereyra Jayson DELENA, DO 01/16/24 [phone]

## 2024-01-16 NOTE — ED Notes (Signed)
Called [facility] to transport patient to [place] [facility] Rm# [phone]

## 2024-01-16 NOTE — H&P (Signed)
 History and Physical  Kalifa Cadden FMW:[id number] DOB: 02-Jul-1946 DOA: 01/15/2024  PCP: Avva, Ravisankar, MD   Chief Complaint: Cough, mild confusion  HPI: Mariacristina Aday is a 78 y.o. female with medical history significant for anxiety, depression, mild pulmonary fibrosis being admitted to the hospital with cough and confusion found to have hyponatremia and influenza A infection as well as community-acquired pneumonia.  Per ER provider documentation, patient has had some mild confusion for the last 2 to 3 days, though it improved rapidly when she came to the ER.  She has had some nausea, vomiting, diarrhea and cough for the past 3 days.  Denies any fevers at home.  Patient has remained stable on room air.  Workup as detailed below shows positive for influenza A, evidence of possible community-acquired pneumonia.  She also had initial sodium of 120, which is improved with IV fluids.  Currently she is resting comfortably, ambulating in her room on room air.  She has no complaints, says that she does not feel confused at all.  Review of Systems: Please see HPI for pertinent positives and negatives. A complete 10 system review of systems are otherwise negative.  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   Brain bleed (HCC)    Broken wrist    LEFT,2006   Cataract    BILATERAL NO SURGERY YET UPDATED 06/09/22   Circumscribed scleroderma    Depression    Diverticulosis    GERD (gastroesophageal reflux disease)    Hyperlipidemia    IBS (irritable bowel syndrome)    Memory loss    Migraines    Osteoarthritis    Osteoporosis    Stroke (HCC) 2015   Past Surgical History:  Procedure Laterality Date   ANKLE FRACTURE SURGERY Left    COLONOSCOPY     ESOPHAGEAL MANOMETRY N/A 12/29/2021   Procedure: ESOPHAGEAL MANOMETRY (EM);  Surgeon: Legrand Victory LITTIE DOUGLAS, MD;  Location: [facility];  Service:  Gastroenterology;  Laterality: N/A;  pt will stay on her PPI   ESOPHAGOGASTRODUODENOSCOPY N/A 02/24/2022   Procedure: ESOPHAGOGASTRODUODENOSCOPY (EGD);  Surgeon: Shyrl Linnie KIDD, MD;  Location[facility];  Service: Thoracic;  Laterality: N/A;   POLYPECTOMY     TOTAL HIP ARTHROPLASTY Left 01/06/2019   Procedure: TOTAL HIP ARTHROPLASTY ANTERIOR APPROACH;  Surgeon: Yvone Rush, MD;  Location: [facility] ORS;  Service: Orthopedics;  Laterality: Left;   TUBAL LIGATION     XI ROBOTIC ASSISTED HIATAL HERNIA REPAIR N/A 02/24/2022   Procedure: XI ROBOTIC ASSISTED HIATAL HERNIA REPAIR with Fundoplication using Myriad Matrix;  Surgeon: Shyrl Linnie KIDD, MD;  Location: [facility] OR;  Service: Thoracic;  Laterality: N/A;   Social History:  reports that she quit smoking about 52 years ago. Her smoking use included cigarettes. She started smoking about 56 years ago. She has a 2 pack-year smoking history. She has been exposed to tobacco smoke. She has never used smokeless tobacco. She reports that she does not currently use alcohol. She reports that she does not use drugs.  Allergies  Allergen Reactions   Actonel [Risedronate]     Severe chest pain   Alendronate Other (See Comments)    Severe chest pain   Boniva [Ibandronic Acid]     Chest pain    Family History  Problem Relation Age of Onset   Heart disease Father    Liver cancer Paternal Aunt  Prostate cancer Paternal Grandfather    Colon cancer Neg Hx    Stomach cancer Neg Hx    Pancreatic cancer Neg Hx    Esophageal cancer Neg Hx    Rectal cancer Neg Hx    Colon polyps Neg Hx    Crohn's disease Neg Hx      Prior to Admission medications   Medication Sig Start Date End Date Taking? Authorizing Provider  ABILIFY  2 MG tablet Take 2 mg by mouth daily. 10/31/22  Yes [provider]  acetaminophen  (TYLENOL ) 325 MG tablet Take 2 tablets (650 mg total) by mouth every 6 (six) hours as needed for mild pain. 02/25/22  Yes Zimmerman, Donielle M, PA-C   ALPRAZolam  (XANAX ) 0.5 MG tablet Take 0.25 mg by mouth at bedtime as needed for anxiety or sleep. 06/05/15  Yes [provider]  baclofen  (LIORESAL ) 10 MG tablet TAKE 1 TABLET BY MOUTH DAILY AS  NEEDED FOR MUSCLE SPASM 09/14/23  Yes Cheryl Waddell HERO, PA-C  buPROPion  (WELLBUTRIN ) 100 MG tablet Take 100 mg by mouth daily.   Yes [provider]  carboxymethylcellulose (REFRESH PLUS) 0.5 % SOLN Place 1 drop into both eyes 3 (three) times daily as needed (dry eyes).   Yes [provider]  Cholecalciferol 50 MCG (2000 UT) CAPS Take 2,000 Units by mouth daily. 11/25/13  Yes [provider]  denosumab  (PROLIA ) 60 MG/ML SOSY injection Inject 60 mg into the skin every 6 (six) months.   Yes [provider]  diclofenac Sodium (VOLTAREN) 1 % GEL Apply 1 application. topically daily as needed (pain).   Yes [provider]  fluticasone  (FLONASE ) 50 MCG/ACT nasal spray Place 1 spray into both nostrils daily.   Yes [provider]  hyoscyamine (LEVSIN) 0.125 MG tablet Take 0.125 mg by mouth every 4 (four) hours as needed for bladder spasms or cramping.   Yes [provider]  loratadine  (CLARITIN ) 10 MG tablet Take 10 mg by mouth daily as needed for allergies.   Yes [provider]  Multiple Vitamin (MULTI-VITAMIN DAILY PO) Take 1 tablet by mouth daily. 10/20/09  Yes [provider]  Olopatadine HCl 0.2 % SOLN Place 1 drop into both eyes daily.   Yes [provider]  pantoprazole  (PROTONIX ) 40 MG tablet Take 1 tablet (40 mg total) by mouth daily. 06/30/23  Yes McMichael, Bayley M, PA-C  venlafaxine  XR (EFFEXOR -XR) 150 MG 24 hr capsule Take 150 mg by mouth daily with breakfast.   Yes [provider]    Physical Exam: BP (!) 157/84 (BP Location: Right Arm)   Pulse 92   Temp 98.5 F (36.9 C) (Oral)   Resp 16   Ht 5' 3 (1.6 m)   Wt 72.6 kg   SpO2 96%   BMI 28.35 kg/m  General:  Alert, oriented, calm, in no  acute distress, ambulating in her room on room air. Cardiovascular: RRR, no murmurs or rubs, no peripheral edema  Respiratory: Breath sounds are distant bilaterally, but without any wheezing, crackles, or rhonchi.  No tachypnea or respiratory distress.  Speaking in full sentences.  Without cough. Abdomen: soft, nontender, nondistended, normal bowel tones heard  Skin: dry, no rashes  Musculoskeletal: no joint effusions, normal range of motion  Psychiatric: appropriate affect, normal speech  Neurologic: extraocular muscles intact, clear speech, moving all extremities with intact sensorium         Labs on Admission:  Basic Metabolic Panel: Recent Labs  Lab 01/15/24 1424 01/15/24 2352 01/16/24  0354 01/16/24 0913 01/16/24 1233  NA 120* 127* 126* 123* 125*  K 3.8 3.5 3.5 3.6 3.1*  CL 86* 93* 92* 91* 93*  CO2 23 24 23 22 24   GLUCOSE 170* 106* 114* 131* 129*  BUN 8 6* 7* 6* 7*  CREATININE 0.70 0.60 0.59 0.59 0.64  CALCIUM  8.7* 8.2* 8.3* 8.2* 8.0*   Liver Function Tests: Recent Labs  Lab 01/15/24 1424 01/15/24 2352  AST 19 18  ALT 12 10  ALKPHOS 52 47  BILITOT 0.4 0.4  PROT 7.0 6.4*  ALBUMIN 4.1 3.9   Recent Labs  Lab 01/15/24 1424  LIPASE 24   No results for input(s): AMMONIA in the last 168 hours. CBC: Recent Labs  Lab 01/15/24 1424  WBC 5.6  HGB 12.5  HCT 36.7  MCV 86.2  PLT 194   Cardiac Enzymes: No results for input(s): CKTOTAL, CKMB, CKMBINDEX, TROPONINI in the last 168 hours. BNP (last 3 results) No results for input(s): BNP in the last 8760 hours.  ProBNP (last 3 results) No results for input(s): PROBNP in the last 8760 hours.  CBG: Recent Labs  Lab 01/15/24 1818  GLUCAP 114*    Radiological Exams on Admission: CT Head Wo Contrast Result Date: 01/15/2024 CLINICAL DATA:  Mental status change of unknown cause. Headache. Chest congestion. EXAM: CT HEAD WITHOUT CONTRAST TECHNIQUE: Contiguous axial images were obtained from the base of  the skull through the vertex without intravenous contrast. RADIATION DOSE REDUCTION: This exam was performed according to the departmental dose-optimization program which includes automated exposure control, adjustment of the mA and/or kV according to patient size and/or use of iterative reconstruction technique. COMPARISON:  MRI 03/11/2015 FINDINGS: Brain: No focal abnormality affects the brainstem or cerebellum. Cerebral hemispheres show chronic encephalomalacia and gliosis in the right frontal lobe. No sign of acute infarction, mass lesion, hemorrhage, hydrocephalus or extra-axial collection. Vascular: No abnormal vascular finding. Skull: Normal Sinuses/Orbits: Clear/normal Other: None IMPRESSION: No acute CT finding. 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 Electronically Signed   By: Oneil Officer M.D.   On: 01/15/2024 17:03   DG Chest 2 View Result Date: 01/15/2024 CLINICAL DATA:  Cough, chest congestion and headache EXAM: CHEST - 2 VIEW COMPARISON:  07/20/2022 FINDINGS: Heart size is normal. Mild aortic atherosclerotic calcification is noted. There is bronchial thickening and mild patchy pulmonary density in both lower lobes. Findings are consistent with bronchitis and mild pneumonia. No dense consolidation or lobar collapse. No visible effusion. IMPRESSION: Bronchitis and mild patchy pneumonia in both lower lobes. Electronically Signed   By: Oneil Officer M.D.   On: 01/15/2024 17:01   Assessment/Plan Deshon Koslowski is a 78 y.o. female with medical history significant for anxiety, depression, mild pulmonary fibrosis being admitted to the hospital with cough and confusion found to have hyponatremia and influenza A infection as well as community-acquired pneumonia.  Community-acquired pneumonia in the setting of influenza A.  No leukocytosis, no significant hypoxia, but with positive influenza swab and consolidations seen on chest x-ray.  Patient is stable on room air without any  evidence of sepsis. 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, sodium has improved with IV fluids and her mental status has returned back to normal -Continue to monitor sodium intermittently -Will place on 1500 cc fluid restriction -Hold Wellbutrin  for the time being, as it can contribute to hyponatremia  GERD-Protonix   Hypokalemia-likely due to GI losses, will replete with oral potassium now, and recheck in the morning  DVT prophylaxis: Lovenox      Code Status: Full Code  Consults called: None  Admission status: Observation  Time spent: 48 minutes  Aras Albarran CHRISTELLA Gail MD [facility] Pager [phone]  If 7PM-7AM, please contact night-coverage [email or web address] Password [email or web address]  01/16/2024, 1:53 PM

## 2024-01-16 NOTE — ED Notes (Signed)
Pt ambulatory to bathroom without assistance.

## 2024-01-17 ENCOUNTER — Other Ambulatory Visit (HOSPITAL_COMMUNITY): Payer: Self-pay

## 2024-01-17 DIAGNOSIS — J101 Influenza due to other identified influenza virus with other respiratory manifestations: Secondary | ICD-10-CM | POA: Diagnosis not present

## 2024-01-17 LAB — CBC
HCT: 36.6 % (ref 36.0–46.0)
Hemoglobin: 12.2 g/dL (ref 12.0–15.0)
MCH: 29.3 pg (ref 26.0–34.0)
MCHC: 33.3 g/dL (ref 30.0–36.0)
MCV: 88 fL (ref 80.0–100.0)
Platelets: 184 10*3/uL (ref 150–400)
RBC: 4.16 MIL/uL (ref 3.87–5.11)
RDW: 12.5 % (ref 11.5–15.5)
WBC: 5.6 10*3/uL (ref 4.0–10.5)
nRBC: 0 % (ref 0.0–0.2)

## 2024-01-17 LAB — BASIC METABOLIC PANEL
Anion gap: 12 (ref 5–15)
BUN: 9 mg/dL (ref 8–23)
CO2: 19 mmol/L — ABNORMAL LOW (ref 22–32)
Calcium: 8.4 mg/dL — ABNORMAL LOW (ref 8.9–10.3)
Chloride: 100 mmol/L (ref 98–111)
Creatinine, Ser: 0.57 mg/dL (ref 0.44–1.00)
GFR, Estimated: >60 mL/min (ref >60)
Glucose, Bld: 99 mg/dL (ref 70–99)
Potassium: 3.7 mmol/L (ref 3.5–5.1)
Sodium: 131 mmol/L — ABNORMAL LOW (ref 135–145)

## 2024-01-17 MED ORDER — OSELTAMIVIR PHOSPHATE 75 MG PO CAPS
75.0000 mg | ORAL_CAPSULE | Freq: Two times a day (BID) | ORAL | 0 refills | Status: AC
  Filled 2024-01-17: qty 6, 3d supply, fill #0

## 2024-01-17 MED ORDER — AZITHROMYCIN 500 MG PO TABS
500.0000 mg | ORAL_TABLET | Freq: Every day | ORAL | 0 refills | Status: AC
Start: 2024-01-17 — End: 2024-01-20
  Filled 2024-01-17: qty 3, 3d supply, fill #0

## 2024-01-17 NOTE — Progress Notes (Signed)
 Discharge complete, patient awaiting ride (not appropriate for dc lounge d/t droplet precautions and occ confusion). Also awaiting [facility] outpt pharm to deliver meds.

## 2024-01-17 NOTE — Plan of Care (Signed)
  Problem: Education: Goal: Knowledge of General Education information will improve Description: Including pain rating scale, medication(s)/side effects and non-pharmacologic comfort measures Outcome: Progressing   Problem: Activity: Goal: Risk for activity intolerance will decrease Outcome: Progressing   Problem: Clinical Measurements: Goal: Respiratory complications will improve Outcome: Progressing

## 2024-01-17 NOTE — Discharge Summary (Signed)
 Physician Discharge Summary  Heather Lindsey FMW:[id number] DOB: January 22, 1946 DOA: 01/15/2024  PCP: Avva, Ravisankar, MD  Admit date: 01/15/2024 Discharge date: 01/17/2024  Admitted From: Home Disposition: Home  Recommendations for Outpatient Follow-up:  Follow up with PCP in 1-2 weeks Please obtain BMP/CBC in one week  Home Health: None Equipment/Devices: None  Discharge Condition: Stable CODE STATUS: Full Diet recommendation: Regular diet as tolerated  Brief/Interim Summary: Heather Lindsey is a 78 y.o. female with medical history significant for anxiety, depression, mild pulmonary fibrosis being admitted to the hospital with cough and confusion found to have hyponatremia and influenza A infection as well as community-acquired pneumonia.  Patient admitted without hypoxia but profound respiratory symptoms due to a influenza pneumonia which have now resolved and patient is back to baseline.  She is able to ambulate without further episodes of dyspnea or notable hypoxia.  Transition to p.o. antibiotics and Tamiflu  -plan to complete outpatient.  Follow-up with PCP as scheduled.  Discharge Diagnoses:  Principal Problem:   Influenza A  Community-acquired pneumonia in the setting of influenza A.  No leukocytosis, no significant hypoxia, but with positive influenza swab and consolidations seen on chest x-ray.  Patient is stable on room air without any evidence of sepsis.   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 intake and IV fluids  Discharge Instructions  Discharge Instructions     Call MD for:  difficulty breathing, headache or visual disturbances   Complete by: As directed    Call MD for:  extreme fatigue   Complete by: As directed    Call MD for:   persistant dizziness or light-headedness   Complete by: As directed    Call MD for:  persistant nausea and vomiting   Complete by: As directed    Call MD for:  temperature >100.4   Complete by: As directed    Diet general   Complete by: As directed    Regular diet - increase fluids as discussed   Discharge instructions   Complete by: As directed    Continue to quarantine for the next 4-5 days at minimum or until you are afebrile for 24h. Wear a mask around other people. Husband may qualify for prophylactic tamiflu  (have him see his PCP).   Increase activity slowly   Complete by: As directed       Allergies as of 01/17/2024       Reactions   Actonel [risedronate]    Severe chest pain   Alendronate Other (See Comments)   Severe chest pain   Boniva [ibandronic Acid]    Chest pain        Medication List     TAKE these medications    Abilify  2 MG tablet Generic drug: ARIPiprazole  Take 2 mg by mouth daily.   acetaminophen  325 MG tablet Commonly known as: TYLENOL  Take 2 tablets (650 mg total) by mouth every 6 (six) hours as needed for mild pain.   ALPRAZolam  0.5 MG tablet Commonly known as: XANAX  Take 0.25 mg by mouth at bedtime as needed for anxiety or sleep.   azithromycin  500 MG tablet Commonly known as: Zithromax  Take 1 tablet (500 mg total) by mouth daily for 3 days.   baclofen  10 MG tablet Commonly known as: LIORESAL  TAKE  1 TABLET BY MOUTH DAILY AS  NEEDED FOR MUSCLE SPASM   buPROPion  100 MG tablet Commonly known as: WELLBUTRIN  Take 100 mg by mouth daily.   carboxymethylcellulose 0.5 % Soln Commonly known as: REFRESH PLUS Place 1 drop into both eyes 3 (three) times daily as needed (dry eyes).   Cholecalciferol 50 MCG (2000 UT) Caps Take 2,000 Units by mouth daily.   denosumab  60 MG/ML Sosy injection Commonly known as: PROLIA  Inject 60 mg into the skin every 6 (six) months.   diclofenac Sodium 1 % Gel Commonly known as: VOLTAREN Apply 1 application.  topically daily as needed (pain).   fluticasone  50 MCG/ACT nasal spray Commonly known as: FLONASE  Place 1 spray into both nostrils daily.   hyoscyamine 0.125 MG tablet Commonly known as: LEVSIN Take 0.125 mg by mouth every 4 (four) hours as needed for bladder spasms or cramping.   loratadine  10 MG tablet Commonly known as: CLARITIN  Take 10 mg by mouth daily as needed for allergies.   MULTI-VITAMIN DAILY PO Take 1 tablet by mouth daily.   Olopatadine HCl 0.2 % Soln Place 1 drop into both eyes daily.   oseltamivir  75 MG capsule Commonly known as: TAMIFLU  Take 1 capsule (75 mg total) by mouth 2 (two) times daily for 3 days.   pantoprazole  40 MG tablet Commonly known as: PROTONIX  Take 1 tablet (40 mg total) by mouth daily.   venlafaxine  XR 150 MG 24 hr capsule Commonly known as: EFFEXOR -XR Take 150 mg by mouth daily with breakfast.        Allergies  Allergen Reactions   Actonel [Risedronate]     Severe chest pain   Alendronate Other (See Comments)    Severe chest pain   Boniva [Ibandronic Acid]     Chest pain    Consultations: None  Procedures/Studies: CT Head Wo Contrast Result Date: 01/15/2024 CLINICAL DATA:  Mental status change of unknown cause. Headache. Chest congestion. EXAM: CT HEAD WITHOUT CONTRAST TECHNIQUE: Contiguous axial images were obtained from the base of the skull through the vertex without intravenous contrast. RADIATION DOSE REDUCTION: This exam was performed according to the departmental dose-optimization program which includes automated exposure control, adjustment of the mA and/or kV according to patient size and/or use of iterative reconstruction technique. COMPARISON:  MRI 03/11/2015 FINDINGS: Brain: No focal abnormality affects the brainstem or cerebellum. Cerebral hemispheres show chronic encephalomalacia and gliosis in the right frontal lobe. No sign of acute infarction, mass lesion, hemorrhage, hydrocephalus or extra-axial collection.  Vascular: No abnormal vascular finding. Skull: Normal Sinuses/Orbits: Clear/normal Other: None IMPRESSION: No acute CT finding. 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 Electronically Signed   By: Oneil Officer M.D.   On: 01/15/2024 17:03   DG Chest 2 View Result Date: 01/15/2024 CLINICAL DATA:  Cough, chest congestion and headache EXAM: CHEST - 2 VIEW COMPARISON:  07/20/2022 FINDINGS: Heart size is normal. Mild aortic atherosclerotic calcification is noted. There is bronchial thickening and mild patchy pulmonary density in both lower lobes. Findings are consistent with bronchitis and mild pneumonia. No dense consolidation or lobar collapse. No visible effusion. IMPRESSION: Bronchitis and mild patchy pneumonia in both lower lobes. Electronically Signed   By: Oneil Officer M.D.   On: 01/15/2024 17:01     Subjective: No acute issues or events overnight   Discharge Exam: Vitals:   01/16/24 2243 01/17/24 0537  BP: 125/74 (!) 145/85  Pulse: 68 83  Resp: 17 17  Temp: 98.8 F (37.1 C) 98 F (  36.7 C)  SpO2: 97% 98%   Vitals:   01/16/24 1314 01/16/24 1717 01/16/24 2243 01/17/24 0537  BP: (!) 157/84 127/76 125/74 (!) 145/85  Pulse: 92 86 68 83  Resp: 16 18 17 17   Temp: 98.5 F (36.9 C) 99.7 F (37.6 C) 98.8 F (37.1 C) 98 F (36.7 C)  TempSrc: Oral Oral Oral Oral  SpO2: 96% 100% 97% 98%  Weight:      Height:        General: Pt is alert, awake, not in acute distress Cardiovascular: RRR, S1/S2 +, no rubs, no gallops Respiratory: CTA bilaterally, no wheezing, no rhonchi Abdominal: Soft, NT, ND, bowel sounds + Extremities: no edema, no cyanosis    The results of significant diagnostics from this hospitalization (including imaging, microbiology, ancillary and laboratory) are listed below for reference.     Microbiology: Recent Results (from the past 240 hours)  Resp panel by RT-PCR (RSV, Flu A&B, Covid) Anterior Nasal Swab     Status: Abnormal   Collection  Time: 01/15/24  2:27 PM   Specimen: Anterior Nasal Swab  Result Value Ref Range Status   SARS Coronavirus 2 by RT PCR NEGATIVE NEGATIVE Final    Comment: (NOTE) SARS-CoV-2 target nucleic acids are NOT DETECTED.  The SARS-CoV-2 RNA is generally detectable in upper respiratory specimens during the acute phase of infection. The lowest concentration of SARS-CoV-2 viral copies this assay can detect is 138 copies/mL. A negative result does not preclude SARS-Cov-2 infection and should not be used as the sole basis for treatment or other patient management decisions. A negative result may occur with  improper specimen collection/handling, submission of specimen other than nasopharyngeal swab, presence of viral mutation(s) within the areas targeted by this assay, and inadequate number of viral copies(<138 copies/mL). A negative result must be combined with clinical observations, patient history, and epidemiological information. The expected result is Negative.  Fact Sheet for Patients:  bloggercourse.com  Fact Sheet for Healthcare Providers:  seriousbroker.it  This test is no t yet approved or cleared by the [place]  FDA and  has been authorized for detection and/or diagnosis of SARS-CoV-2 by FDA under an Emergency Use Authorization (EUA). This EUA will remain  in effect (meaning this test can be used) for the duration of the COVID-19 declaration under Section 564(b)(1) of the Act, 21 U.S.C.section 360bbb-3(b)(1), unless the authorization is terminated  or revoked sooner.       Influenza A by PCR POSITIVE (A) NEGATIVE Final   Influenza B by PCR NEGATIVE NEGATIVE Final    Comment: (NOTE) The Xpert Xpress SARS-CoV-2/FLU/RSV plus assay is intended as an aid in the diagnosis of influenza from Nasopharyngeal swab specimens and should not be used as a sole basis for treatment. Nasal washings and aspirates are unacceptable for Xpert  Xpress SARS-CoV-2/FLU/RSV testing.  Fact Sheet for Patients: bloggercourse.com  Fact Sheet for Healthcare Providers: seriousbroker.it  This test is not yet approved or cleared by the [place]  FDA and has been authorized for detection and/or diagnosis of SARS-CoV-2 by FDA under an Emergency Use Authorization (EUA). This EUA will remain in effect (meaning this test can be used) for the duration of the COVID-19 declaration under Section 564(b)(1) of the Act, 21 U.S.C. section 360bbb-3(b)(1), unless the authorization is terminated or revoked.     Resp Syncytial Virus by PCR NEGATIVE NEGATIVE Final    Comment: (NOTE) Fact Sheet for Patients: bloggercourse.com  Fact Sheet for Healthcare Providers: seriousbroker.it  This test is not yet approved  or cleared by the [place]  FDA and has been authorized for detection and/or diagnosis of SARS-CoV-2 by FDA under an Emergency Use Authorization (EUA). This EUA will remain in effect (meaning this test can be used) for the duration of the COVID-19 declaration under Section 564(b)(1) of the Act, 21 U.S.C. section 360bbb-3(b)(1), unless the authorization is terminated or revoked.  Performed at [facility], [address] [place], [place] [address]   Blood culture (routine x 2)     Status: None (Preliminary result)   Collection Time: 01/15/24  5:30 PM   Specimen: BLOOD  Result Value Ref Range Status   Specimen Description   Final    BLOOD RIGHT ANTECUBITAL Performed at [facility], [address] [place], [place] [address]    Special Requests   Final    BOTTLES DRAWN AEROBIC ONLY Blood Culture adequate volume Performed at [facility], [address] [place], [place] [address]    Culture   Final    NO GROWTH 2 DAYS Performed at [facility], [address] [place], [place] [address]    Report Status PENDING  Incomplete  Blood culture (routine x 2)     Status: None (Preliminary result)   Collection Time: 01/15/24  5:40 PM   Specimen: BLOOD  Result Value Ref Range Status   Specimen Description   Final    BLOOD LEFT ANTECUBITAL Performed at [facility], [address] [place], [place] [address]    Special Requests   Final    BOTTLES DRAWN AEROBIC ONLY Blood Culture adequate volume Performed at [facility], [address] [place] [address] [place], [place] [address]    Culture   Final    NO GROWTH 2 DAYS Performed at [facility], [address], [place], [place] [address]    Report Status PENDING  Incomplete     Labs: BNP (last 3 results) No results for input(s): BNP in the last 8760 hours. Basic Metabolic Panel: Recent Labs  Lab 01/16/24 0354 01/16/24 0913 01/16/24 1233 01/16/24 1912 01/17/24 0326  NA 126* 123* 125* 129* 131*  K 3.5 3.6 3.1* 3.3* 3.7  CL 92* 91* 93* 96* 100  CO2 23 22 24 22  19*  GLUCOSE 114* 131* 129* 102* 99  BUN 7* 6* 7* 12 9  CREATININE 0.59 0.59 0.64 0.58 0.57  CALCIUM  8.3* 8.2* 8.0* 8.1* 8.4*   Liver Function Tests: Recent Labs  Lab 01/15/24 1424 01/15/24 2352  AST 19 18  ALT 12 10  ALKPHOS 52 47  BILITOT 0.4 0.4  PROT 7.0 6.4*  ALBUMIN 4.1 3.9   Recent Labs  Lab 01/15/24 1424  LIPASE 24   No results for input(s): AMMONIA in the last 168 hours. CBC: Recent Labs  Lab 01/15/24 1424 01/17/24 0326  WBC 5.6 5.6  HGB 12.5 12.2  HCT 36.7 36.6  MCV 86.2 88.0  PLT 194 184   Cardiac Enzymes: No results for input(s): CKTOTAL, CKMB, CKMBINDEX, TROPONINI in the last 168 hours. BNP: Invalid input(s): POCBNP CBG: Recent Labs  Lab 01/15/24 1818  GLUCAP 114*   D-Dimer No results for input(s): DDIMER in the last 72 hours. Hgb A1c No results for input(s): HGBA1C in the last 72 hours. Lipid Profile No results for input(s): CHOL,  HDL, LDLCALC, TRIG, CHOLHDL, LDLDIRECT in the last 72 hours. Thyroid function studies No results for input(s): TSH, T4TOTAL, T3FREE, THYROIDAB in the last 72 hours.  Invalid input(s): FREET3 Anemia work up  No results for input(s): VITAMINB12, FOLATE, FERRITIN, TIBC, IRON, RETICCTPCT in the last 72 hours. Urinalysis    Component Value Date/Time   COLORURINE YELLOW 01/15/2024 1830   APPEARANCEUR CLEAR 01/15/2024 1830   LABSPEC 1.011 01/15/2024 1830   PHURINE 7.0 01/15/2024 1830   GLUCOSEU NEGATIVE 01/15/2024 1830   HGBUR NEGATIVE 01/15/2024 1830   BILIRUBINUR NEGATIVE 01/15/2024 1830   KETONESUR 15 (A) 01/15/2024 1830   PROTEINUR NEGATIVE 01/15/2024 1830   NITRITE NEGATIVE 01/15/2024 1830   LEUKOCYTESUR NEGATIVE 01/15/2024 1830   Sepsis Labs Recent Labs  Lab 01/15/24 1424 01/17/24 0326  WBC 5.6 5.6   Microbiology Recent Results (from the past 240 hours)  Resp panel by RT-PCR (RSV, Flu A&B, Covid) Anterior Nasal Swab     Status: Abnormal   Collection Time: 01/15/24  2:27 PM   Specimen: Anterior Nasal Swab  Result Value Ref Range Status   SARS Coronavirus 2 by RT PCR NEGATIVE NEGATIVE Final    Comment: (NOTE) SARS-CoV-2 target nucleic acids are NOT DETECTED.  The SARS-CoV-2 RNA is generally detectable in upper respiratory specimens during the acute phase of infection. The lowest concentration of SARS-CoV-2 viral copies this assay can detect is 138 copies/mL. A negative result does not preclude SARS-Cov-2 infection and should not be used as the sole basis for treatment or other patient management decisions. A negative result may occur with  improper specimen collection/handling, submission of specimen other than nasopharyngeal swab, presence of viral mutation(s) within the areas targeted by this assay, and inadequate number of viral copies(<138 copies/mL). A negative result must be combined with clinical observations, patient history, and  epidemiological information. The expected result is Negative.  Fact Sheet for Patients:  bloggercourse.com  Fact Sheet for Healthcare Providers:  seriousbroker.it  This test is no t yet approved or cleared by the [place]  FDA and  has been authorized for detection and/or diagnosis of SARS-CoV-2 by FDA under an Emergency Use Authorization (EUA). This EUA will remain  in effect (meaning this test can be used) for the duration of the COVID-19 declaration under Section 564(b)(1) of the Act, 21 U.S.C.section 360bbb-3(b)(1), unless the authorization is terminated  or revoked sooner.       Influenza A by PCR POSITIVE (A) NEGATIVE Final   Influenza B by PCR NEGATIVE NEGATIVE Final    Comment: (NOTE) The Xpert Xpress SARS-CoV-2/FLU/RSV plus assay is intended as an aid in the diagnosis of influenza from Nasopharyngeal swab specimens and should not be used as a sole basis for treatment. Nasal washings and aspirates are unacceptable for Xpert Xpress SARS-CoV-2/FLU/RSV testing.  Fact Sheet for Patients: bloggercourse.com  Fact Sheet for Healthcare Providers: seriousbroker.it  This test is not yet approved or cleared by the [place]  FDA and has been authorized for detection and/or diagnosis of SARS-CoV-2 by FDA under an Emergency Use Authorization (EUA). This EUA will remain in effect (meaning this test can be used) for the duration of the COVID-19 declaration under Section 564(b)(1) of the Act, 21 U.S.C. section 360bbb-3(b)(1), unless the authorization is terminated or revoked.     Resp Syncytial Virus by PCR NEGATIVE NEGATIVE Final    Comment: (NOTE) Fact Sheet for Patients: bloggercourse.com  Fact Sheet for Healthcare Providers: seriousbroker.it  This test is not yet approved or cleared by the [place]  FDA and has  been authorized for detection and/or diagnosis of SARS-CoV-2 by FDA under an Emergency Use Authorization (EUA). This EUA will remain in effect (meaning this test can be used) for the  duration of the COVID-19 declaration under Section 564(b)(1) of the Act, 21 U.S.C. section 360bbb-3(b)(1), unless the authorization is terminated or revoked.  Performed at [facility], [address] [place], [place] [address]   Blood culture (routine x 2)     Status: None (Preliminary result)   Collection Time: 01/15/24  5:30 PM   Specimen: BLOOD  Result Value Ref Range Status   Specimen Description   Final    BLOOD RIGHT ANTECUBITAL Performed at [facility], [address], [place], [place] [address]    Special Requests   Final    BOTTLES DRAWN AEROBIC ONLY Blood Culture adequate volume Performed at [facility], [address], [place], [place] [address]    Culture   Final    NO GROWTH 2 DAYS Performed at [facility], [address], [place], [place] [address]    Report Status PENDING  Incomplete  Blood culture (routine x 2)     Status: None (Preliminary result)   Collection Time: 01/15/24  5:40 PM   Specimen: BLOOD  Result Value Ref Range Status   Specimen Description   Final    BLOOD LEFT ANTECUBITAL Performed at [facility], [address] [place], [place] [address]    Special Requests   Final    BOTTLES DRAWN AEROBIC ONLY Blood Culture adequate volume Performed at [facility], [address], [place], [place] [address]    Culture   Final    NO GROWTH 2 DAYS Performed at [facility], [address], [place], [place] [address]    Report Status PENDING  Incomplete     Time coordinating discharge: Over 30 minutes  SIGNED:   Elsie JAYSON Montclair, DO [facility] 01/17/2024, 8:51 AM Pager   If 7PM-7AM, please contact night-coverage [email or web address]

## 2024-01-20 LAB — CULTURE, BLOOD (ROUTINE X 2)
Culture: NO GROWTH
Culture: NO GROWTH
Special Requests: ADEQUATE
Special Requests: ADEQUATE

## 2024-02-13 ENCOUNTER — Encounter: Payer: Self-pay | Admitting: Internal Medicine

## 2024-02-13 ENCOUNTER — Ambulatory Visit: Payer: Medicare Other | Admitting: Internal Medicine

## 2024-02-13 VITALS — BP 122/80 | HR 75

## 2024-02-13 DIAGNOSIS — E871 Hypo-osmolality and hyponatremia: Secondary | ICD-10-CM

## 2024-02-13 DIAGNOSIS — Z8679 Personal history of other diseases of the circulatory system: Secondary | ICD-10-CM

## 2024-02-13 DIAGNOSIS — K449 Diaphragmatic hernia without obstruction or gangrene: Secondary | ICD-10-CM

## 2024-02-13 DIAGNOSIS — Z9889 Other specified postprocedural states: Secondary | ICD-10-CM | POA: Diagnosis not present

## 2024-02-13 DIAGNOSIS — J849 Interstitial pulmonary disease, unspecified: Secondary | ICD-10-CM

## 2024-02-13 DIAGNOSIS — Z8709 Personal history of other diseases of the respiratory system: Secondary | ICD-10-CM

## 2024-02-13 LAB — BASIC METABOLIC PANEL
BUN: 12 mg/dL (ref 6–23)
CO2: 27 meq/L (ref 19–32)
Calcium: 9.6 mg/dL (ref 8.4–10.5)
Chloride: 103 meq/L (ref 96–112)
Creatinine, Ser: 0.95 mg/dL (ref 0.40–1.20)
GFR: 58.11 mL/min — ABNORMAL LOW (ref >60.00)
Glucose, Bld: 94 mg/dL (ref 70–99)
Potassium: 4.6 meq/L (ref 3.5–5.1)
Sodium: 138 meq/L (ref 135–145)

## 2024-02-13 NOTE — Progress Notes (Signed)
 OV 03/17/2022  Subjective:  Patient ID: Heather Lindsey, female , DOB: [birth year] , age 78 y.o. , MRN: [id number] , ADDRESS: [address] [place] [address] PCP Chilton Greathouse, MD Patient Care Team: Chilton Greathouse, MD as PCP - General (Internal Medicine)  This Provider for this visit: Treatment Team:  Attending Provider: Kalman Shan, MD    03/17/2022 -   Chief Complaint  Patient presents with   Consult    Pt is being seen due to recent CT.  Pt has no current complaints.     HPI Heather Lindsey 78 y.o. -referred by primary care physician Dr. Felipa Eth for evaluation of possible interstitial lung disease.  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  She tells me that she has had acid reflux for the last several years.  She is also had previous Raynaud's but review the records indicate detailed connective tissue disease work-up is negative.  As part of this she was diagnosed have hiatal hernia and then she had a CT scan that showed significant hiatal hernia then on 02/24/2022 she underwent hiatal hernia surgery by Dr. Judi Cong.  Specifically surgery was status post esophagoscopy and hiatal hernia repair with [facility] admission nisin fundoplication.  Before the surgery she was having cough and acid reflux.  Both the symptoms have resolved.  She has never had dyspnea on exertion except when climbing heavy set of stairs this thing is still persistent.  And it is only mild.  Overall she is doing well.  But she is here for the ILD.  She does do water aerobics 3 times a week before the surgery and she never had any shortness of breath for that.  Specifically ILD question as below   [facility] [facility] Comprehensive ILD Questionnaire  Symptoms:      Past Medical History :  -Morphea sees rheumatology Acid reflux with hiatal hernia status post Nissen fundoplication mid March 2023 by Dr. Cliffton Asters Has osteoarthritis Has acid reflux controlled after hiatal hernia  surgery 2015 right frontal lobe brain bleed 1968 infectious mononucleosis Raynaud NOS   ROS:  Positive for fatigue and arthralgia.  Positive for dry eyes.  Positive for Raynaud's.  6 connective tissue disease autoimmune antibody panel negative.  Positive for acid reflux but now controlled after surgery  FAMILY HISTORY of LUNG DISEASE:  Denies family history of lung disease*  PERSONAL EXPOSURE HISTORY:  -Started smoking 1966.  Quit smoking 1971.  10 to 15 cigarettes a day.  No marijuana use no cocaine use.  No intravenous drug use  HOME  EXPOSURE and HOBBY DETAILS :  -[place].  The home itself is 77 years old she uses a steam iron but there is no mildew in it.  . Pt said that she used to have a cockatiel but he died years ago. Pt said that she does have a sofa that has feathers in it. ..  She does have feather pillow and sulfa  OCCUPATIONAL HISTORY (122 questions) : Detail organic and inorganic antigen exposure history is negative.  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  She has been on methotrexate for morphea 15 or 20 years ago and tries to avoid it.  She was on some prednisone 5 years ago.  INVESTIGATIONS:   She has had a CT chest with contrast: I personally  visualized this and showed it to her.  There is some ILD -there is a craniocaudal gradient.  It is very mild.  Is either postinflammatory or indeterminate for UIP pattern.  Any with a contrast CT was done in February 2023 before the hiatal hernia surgery   OV 08/18/2022  Subjective:  Patient ID: Heather Lindsey, female , DOB: 1946/04/13 , age 64 y.o. , MRN: [id number] , ADDRESS: [address] [place] [address] PCP Chilton Greathouse, MD Patient Care Team: Chilton Greathouse, MD as PCP - General (Internal Medicine)  This Provider for this visit: Treatment Team:   Attending Provider: Kalman Shan, MD    08/18/2022 -   Chief Complaint  Patient presents with   Follow-up    PFT performed today.  Pt denies any complaints of SOB. Does have an occasional cough.     HPI Heather Lindsey 78 y.o. -returns for follow-up.  She presents with her husband who is a retired Corporate investment banker person who is involved with hemophilia medications.  She continues to feel stable.  She has very minimal shortness of breath with exertion.  She has some cough just like last time but she tells me it is actually acute and she feels she has allergies for the last few days.  She has not tested herself for COVID.  Currently there is widespread COVID in the community.  There is no fever or chills or other COVID symptoms.  In any event she is here for her ILD work-up.  She is not on any proton pump inhibitors.  Her pulmonary function test essentially normal except for a very slight reduction in DLCO.  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  Her serologic work-up was negative.    HRCT 07/20/22  Narrative & Impression  CLINICAL DATA:  Interstitial lung disease, former smoker   EXAM: CT CHEST WITHOUT CONTRAST   TECHNIQUE: Multidetector CT imaging of the chest was performed following the standard protocol without intravenous contrast. High resolution imaging of the lungs, as well as inspiratory and expiratory imaging, was performed.   RADIATION DOSE REDUCTION: This exam was performed according to the departmental dose-optimization program which includes automated exposure control, adjustment of the mA and/or kV according to patient size and/or use of iterative reconstruction technique.   COMPARISON:  01/27/2022   FINDINGS: Cardiovascular: Scattered aortic atherosclerosis. Normal heart size. No pericardial effusion.   Mediastinum/Nodes: No enlarged mediastinal, hilar, or axillary  lymph nodes. Small hiatal hernia. Thyroid gland, trachea, and esophagus demonstrate no significant findings.   Lungs/Pleura: Unchanged mild pulmonary fibrosis in a pattern with apical to basal gradient, featuring irregular peripheral interstitial opacity, septal thickening, traction bronchiectasis, and minimal subpleural bronchiolectasis at the bilateral lung bases. No significant air trapping on expiratory phase imaging. No pleural effusion or pneumothorax.   Upper Abdomen: No acute abnormality.   Musculoskeletal: No chest wall abnormality. No acute osseous findings.   IMPRESSION: 1. Unchanged mild pulmonary fibrosis in a pattern with apical to basal gradient, featuring irregular peripheral interstitial opacity, septal thickening, traction bronchiectasis, and minimal subpleural bronchiolectasis at the bilateral lung bases. 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 Am Rosezetta Schlatter Crit Care Med Vol 198, Iss 5, [phone], Aug 12 2017. 2. Small hiatal hernia.   Aortic Atherosclerosis (ICD10-I70.0).     Electronically Signed   By: Jearld Lesch M.D.   On: 07/21/2022  11:16       IMMUNOLOGY   Latest Reference Range & Units 03/02/22 13:38  Anti Nuclear Antibody (ANA) NEGATIVE  NEGATIVE  ANCA SCREEN Negative  Negative  Cyclic Citrullin Peptide Ab UNITS <16  ds DNA Ab IU/mL <1  Myeloperoxidase Abs <1.0 AI <1.0  Serine Protease 3 <1.0 AI <1.0  RA Latex Turbid. <14 IU/mL <14  ENA SM Ab Ser-aCnc <1.0 NEG AI <1.0 NEG  C3 Complement 83 - 193 mg/dL 063 (H)  C4 Complement 15 - 57 mg/dL 41  Ribonucleic Protein(ENA) Antibody, IgG <1.0 NEG AI <1.0 NEG  SSA (Ro) (ENA) Antibody, IgG <1.0 NEG AI <1.0 NEG  SSB (La) (ENA) Antibody, IgG <1.0 NEG AI <1.0 NEG  Scleroderma (Scl-70) (ENA) Antibody, IgG <1.0 NEG AI <1.0 NEG  (H): Data is abnormally high    OV  11/17/2022  Subjective:  Patient ID: Heather Lindsey, female , DOB: 06/30/1946 , age 46 y.o. , MRN: [id number] , ADDRESS: [address] [place] [address] PCP Chilton Greathouse, MD Patient Care Team: Chilton Greathouse, MD as PCP - General (Internal Medicine)  This Provider for this visit: Treatment Team:  Attending Provider: Kalman Shan, MD    11/17/2022 -   Chief Complaint  Patient presents with   Follow-up    PFT performed today.  Pt states she has been doing okay since last visit and denies any complaints.     HPI Anatalia Kronk 78 y.o. -returns for follow-up.  Since her last visit she did have COVID but she is doing well overall.  Symptom score is stable very minimal symptoms dyspnea score is just 1.  Her pulmonary function test shows stability/improvement.  Walking desaturation test was stable except for some tachycardia with walking.  There are no new complaints.  She has gotten rid of feather pillow.  Acid reflux under control.  She wants to have her Pneumovax vaccine.  She is only had Prevnar according to the chart.  She specifically asked for Pneumovax.  WE do not have stock   OV 07/04/2023  Subjective:  Patient ID: Heather Lindsey, female , DOB: [birth year] , age 73 y.o. , MRN: [id number] , ADDRESS: [address] [place] [address] PCP Chilton Greathouse, MD Patient Care Team: Chilton Greathouse, MD as PCP - General (Internal Medicine)  This Provider for this visit: Treatment Team:  Attending Provider: Kalman Shan, MD    07/04/2023 -   Chief Complaint  Patient presents with   Follow-up    6 month f/up, no complaints     HPI Gracyn Allor 78 y.o. -she has negative serology from a year ago.  Did see Dr. Corliss Skains 06/29/2023 for rheumatology consult.  Diagnosed to have morphea.  Raynaud's acknowledged.  Also followed up with GI PA on 06/30/2023.  At that time she reported dysphagia for 2 months.  They recommended barium swallow if no improvement with some  lifestyle modifications and taking PPI.  From a respiratory standpoint she continues to be stable.  No new symptoms. Interim Health status: No new complaints No new medical problems. No new surgeries. No ER visits. No Urgent care visits. No changes to medications.  Pulmonary function test appears stable today.       OV 02/13/2024  Subjective:  Patient ID: Heather Lindsey, female , DOB: [birth year] , age 53 y.o. , MRN: [id number] , ADDRESS: [address] [place] [address] PCP Chilton Greathouse, MD Patient Care Team: Chilton Greathouse, MD as PCP - General (Internal Medicine)  This  Provider for this visit: Treatment Team:  Attending Provider: Kalman Shan, MD  Mild interstitial lung disease in the setting of bird for the exposure, hiatal hernia status post Nissen and also Raynaud's Las CT Aug 2023 On expectant followup  02/13/2024 -   Chief Complaint  Patient presents with   Follow-up    Admitted to hospital with flu and pna 01/15/24-01/17/24. Breathing has returned to baseline for her. She has prod cough with clear sputum.      HPI Mikiala Fugett 78 y.o. -returns for follow-up.  She is ILD on supportive care and expectant monitoring.  She had pulmonary function test end of January 2025 and shows multiyear stability.  However, shortly after that she got hospitalized for influenza.  I visualized the chest x-ray and there is probable pulmonary densities suggestive of viral pneumonia.  She also had low sodium at this time and was hallucinating.  She was not on oxygen in the hospital according to history.  She is returned back to baseline now.  She feels shortness of breath is at baseline but symptom score shows over the last few years there is a slight increase in dyspnea on exertion on stairs.  Exercise hypoxemia test did show a tendency to drop pulse ox by 3 points today.  Nevertheless she feels stable.  I did indicate to her that after a viral admission [she did have flu shot] patients are  at high risk for progression in the following year.  Therefore she needs closer monitoring she is agreeable with this plan.  She did want her sodium levels rechecked today.  Currently no hallucinations or balance issues or headaches or dizziness.      SYMPTOM SCALE - ILD 03/17/2022 08/18/2022  11/17/2022  07/04/2023  02/13/2024   Current weight       O2 use ra ra ra ra ra  Shortness of Breath 0 -> 5 scale with 5 being worst (score 6 If unable to do)      At rest 0 0 0 0 0  Simple tasks - showers, clothes change, eating, shaving 0 00 0 0 0  Household (dishes, doing bed, laundry) 0 0 0 0   Shopping 0 0 0 0 00  Walking level at own pace 0 0 0 0 0  Walking up Stairs 1.5 1 1 2 3   Total (30-36) Dyspnea Score 1.5 1 1 2 3   How bad is your cough? 2 2 0 0 1  How bad is your fatigue 3 3 2  2.5 1  How bad is nausea 0 0 0 0 0  How bad is vomiting?  0 0 0 0 0  How bad is diarrhea? 0 0 0 0 0  How bad is anxiety? 3 2.5 1.5 2 0  How bad is depression 3 2 1.5 "SAD" wintder 1 1  Any chronic pain - if so where and how bad 3 x  x 0       Simple office walk 185 feet x  3 laps goal with forehead probe 03/17/2022  11/17/2022  02/13/2024   O2 used ra ra ra  Number laps completed 3 3 Sit sand stnd  15  Comments about pace avg    Resting Pulse Ox/HR 100% and 79/min 100% and HR 86  95% and HR 81  Final Pulse Ox/HR 100% and 1051/min 100% and HR 115 91% and HR 98  Desaturated </= 88% no non   Desaturated <= 3% points no no   Got Tachycardic >/=  90/min yes yes   Symptoms at end of test No complaitns No compalitns   Miscellaneous comments x x      PFT     Latest Ref Rng & Units 01/04/2024    2:00 PM 06/19/2023    7:27 AM 11/17/2022    1:01 PM 08/18/2022    8:57 AM  PFT Results  FVC-Pre L 2.85  2.69  2.93  2.88   FVC-Predicted Pre % 109  103  111  109   FVC-Post L    2.83   FVC-Predicted Post %    107   Pre FEV1/FVC % % 80  85  84  82   Post FEV1/FCV % %    82   FEV1-Pre L 2.29  2.27  2.46  2.36    FEV1-Predicted Pre % 118  117  124  119   FEV1-Post L    2.33   DLCO uncorrected ml/min/mmHg 13.48  15.01  14.26  12.89   DLCO UNC% % 74  82  78  71   DLCO corrected ml/min/mmHg 13.48  15.01  14.26  12.89   DLCO COR %Predicted % 74  82  78  71   DLVA Predicted % 74  84  74  67   TLC L    4.91   TLC % Predicted %    101   RV % Predicted %    80        LAB RESULTS last 96 hours No results found.       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   reports that she quit smoking about 52 years ago. Her smoking use included cigarettes. She started smoking about 56 years ago. She has a 2 pack-year smoking history. She has been exposed to tobacco smoke. She has never used smokeless tobacco.  Past Surgical History:  Procedure Laterality Date   ANKLE FRACTURE SURGERY Left    COLONOSCOPY     ESOPHAGEAL MANOMETRY N/A 12/29/2021   Procedure: ESOPHAGEAL MANOMETRY (EM);  Surgeon: Sherrilyn Rist, MD;  Location: [facility];  Service: Gastroenterology;  Laterality: N/A;  pt will stay on her PPI   ESOPHAGOGASTRODUODENOSCOPY N/A 02/24/2022   Procedure: ESOPHAGOGASTRODUODENOSCOPY (EGD);  Surgeon: Corliss Skains, MD;  Location: [facility] OR;  Service: Thoracic;  Laterality: N/A;   POLYPECTOMY     TOTAL HIP ARTHROPLASTY Left 01/06/2019   Procedure: TOTAL HIP ARTHROPLASTY ANTERIOR APPROACH;  Surgeon: Jodi Geralds, MD;  Location: [facility] ORS;  Service: Orthopedics;  Laterality: Left;   TUBAL LIGATION     XI ROBOTIC ASSISTED HIATAL HERNIA REPAIR N/A 02/24/2022   Procedure: XI ROBOTIC ASSISTED HIATAL HERNIA REPAIR with Fundoplication using Myriad Matrix;  Surgeon:  Corliss Skains, MD;  Location: [facility] OR;  Service: Thoracic;  Laterality: N/A;    Allergies  Allergen Reactions   Actonel [Risedronate]     Severe chest pain   Alendronate Other (See Comments)    Severe chest pain   Boniva [Ibandronate]     Chest pain    Immunization History  Administered Date(s) Administered   Influenza Split 11/02/2009, 09/02/2010, 11/15/2011, 09/25/2012, 11/25/2013, 09/30/2014   Influenza, High Dose Seasonal PF 08/16/2017, 10/21/2022   Influenza, Quadrivalent, Recombinant, Inj, Pf 09/08/2018, 09/12/2019, 10/07/2020, 10/06/2021  Influenza,inj,Quad PF,6+ Mos 08/20/2015   Influenza-Unspecified 09/12/2023   PFIZER(Purple Top)SARS-COV-2 Vaccination 12/28/2019, 01/18/2020, 08/07/2020   Pneumococcal Conjugate-13 11/25/2013   Tdap 10/15/2007   Zoster Recombinant(Shingrix) 03/01/2018, 06/17/2018   Zoster, Live 11/02/2009, 04/16/2013    Family History  Problem Relation Age of Onset   Heart disease Father    Liver cancer Paternal Aunt    Prostate cancer Paternal Grandfather    Colon cancer Neg Hx    Stomach cancer Neg Hx    Pancreatic cancer Neg Hx    Esophageal cancer Neg Hx    Rectal cancer Neg Hx    Colon polyps Neg Hx    Crohn's disease Neg Hx      Current Outpatient Medications:    acetaminophen (TYLENOL) 325 MG tablet, Take 2 tablets (650 mg total) by mouth every 6 (six) hours as needed for mild pain., Disp: , Rfl:    ALPRAZolam (XANAX) 0.5 MG tablet, Take 0.25 mg by mouth at bedtime as needed for anxiety or sleep., Disp: , Rfl:    baclofen (LIORESAL) 10 MG tablet, TAKE 1 TABLET BY MOUTH DAILY AS  NEEDED FOR MUSCLE SPASM, Disp: 60 tablet, Rfl: 0   buPROPion (WELLBUTRIN) 100 MG tablet, Take 100 mg by mouth daily., Disp: , Rfl:    carboxymethylcellulose (REFRESH PLUS) 0.5 % SOLN, Place 1 drop into both eyes 3 (three) times daily as needed (dry eyes)., Disp: , Rfl:    Cholecalciferol 50 MCG (2000 UT) CAPS, Take 2,000 Units by mouth daily., Disp: , Rfl:     denosumab (PROLIA) 60 MG/ML SOSY injection, Inject 60 mg into the skin every 6 (six) months., Disp: , Rfl:    diclofenac Sodium (VOLTAREN) 1 % GEL, Apply 1 application. topically daily as needed (pain)., Disp: , Rfl:    fluticasone (FLONASE) 50 MCG/ACT nasal spray, Place 1 spray into both nostrils daily., Disp: , Rfl:    hyoscyamine (LEVSIN) 0.125 MG tablet, Take 0.125 mg by mouth every 4 (four) hours as needed for bladder spasms or cramping., Disp: , Rfl:    loratadine (CLARITIN) 10 MG tablet, Take 10 mg by mouth daily as needed for allergies., Disp: , Rfl:    Multiple Vitamin (MULTI-VITAMIN DAILY PO), Take 1 tablet by mouth daily., Disp: , Rfl:    Olopatadine HCl 0.2 % SOLN, Place 1 drop into both eyes daily., Disp: , Rfl:    pantoprazole (PROTONIX) 40 MG tablet, Take 1 tablet (40 mg total) by mouth daily., Disp: 90 tablet, Rfl: 3   venlafaxine XR (EFFEXOR-XR) 150 MG 24 hr capsule, Take 150 mg by mouth daily with breakfast., Disp: , Rfl:       Objective:   Vitals:   02/13/24 1104  BP: 122/80  Pulse: 75  SpO2: 98%    Estimated body mass index is 28.35 kg/m as calculated from the following:   Height as of 01/15/24: 5\' 3"  (1.6 m).   Weight as of 01/15/24: 160 lb 0.9 oz (72.6 kg).  @WEIGHTCHANGE @  There were no vitals filed for this visit.   Physical Exam   General: No distress. Looks well O2 at rest: no Cane present: no Sitting in wheel chair: no Frail: no Obese: no Neuro: Alert and Oriented x 3. GCS 15. Speech normal Psych: Pleasant Resp:  Barrel Chest - no.  Wheeze - no, Crackles - mild crackle bas, No overt respiratory distress CVS: Normal heart sounds. Murmurs - no Ext: Stigmata of Connective Tissue Disease - no HEENT: Normal upper airway. PEERL +. No  post nasal drip        Assessment:       ICD-10-CM   1. ILD (interstitial lung disease) (HCC)  J84.9 Basic Metabolic Panel (BMET)    Pulmonary function test    CT Chest High Resolution    2. Hiatal hernia   K44.9 Basic Metabolic Panel (BMET)    Pulmonary function test    CT Chest High Resolution    3. History of Raynaud's syndrome  Z86.79 Basic Metabolic Panel (BMET)    Pulmonary function test    CT Chest High Resolution    4. Status post Nissen fundoplication  [id number] Basic Metabolic Panel (BMET)    Pulmonary function test    CT Chest High Resolution    5. History of influenza  Z87.09 Basic Metabolic Panel (BMET)    Pulmonary function test    CT Chest High Resolution    6. Hyponatremia  E87.1 Basic Metabolic Panel (BMET)    Pulmonary function test    CT Chest High Resolution         Plan:     Patient Instructions  ILD (interstitial lung disease) (HCC) Hiatal hernia Status post Nissen fundoplication History of Raynaud's syndrome History of influenza and hyponatremia with confusion.  Admission February 2025.   - mild/early ILD since feb 2023 in setting of hiatal hernia and GERD; - unclear if this is IPF v non-IPF;   - -s/p Nissen in March 2023 and glad you got rid of feather pillos -Last CT scan August 2023 - stable disease 07/04/2023 -> January 2025 but after that you had influenza and even though you have recovered you are at risk of getting worse this calendar year in 2025 -  -Therefore you need more intensive monitoring  Plan -Check blood work to ensure your low sodium has normalized. -Do spirometry and DLCO in 3 months (will order this for [address] - Do high-resolution CT chest in 3 months [will order it at [facility]]   Followup - 3 months but after spirome /dlco and CT scan of the chest; 15-minute visit  - symptoms score and walk test at followup  - can decide HRCT followup at time of next visit   FOLLOWUP Return in about 3 months (around 05/15/2024) for 15 min visit, after Cleda Daub and DLCO, with Dr Marchelle Gearing, after HRCT chest, Face to Face Visit, ILD.    SIGNATURE    Dr. Kalman Shan, M.D., F.C.C.P,  Pulmonary and Critical Care Medicine Staff  Physician, [facility] Director - Interstitial Lung Disease  Program  Pulmonary Fibrosis [facility] at [facility] [place], [place], [address]  Pager: [phone], If no answer or between  15:00h - 7:00h: call [phone] Telephone: [phone]  11:36 AM 02/13/2024

## 2024-02-13 NOTE — Patient Instructions (Addendum)
 ILD (interstitial lung disease) (HCC) Hiatal hernia Status post Nissen fundoplication History of Raynaud's syndrome History of influenza and hyponatremia with confusion.  Admission February 2025.   - mild/early ILD since feb 2023 in setting of hiatal hernia and GERD; - unclear if this is IPF v non-IPF;   - -s/p Nissen in March 2023 and glad you got rid of feather pillos -Last CT scan August 2023 - stable disease 07/04/2023 -> January 2025 but after that you had influenza and even though you have recovered you are at risk of getting worse this calendar year in 2025 -  -Therefore you need more intensive monitoring  Plan -Check blood work to ensure your low sodium has normalized. -Do spirometry and DLCO in 3 months (will order this for [address] - Do high-resolution CT chest in 3 months [will order it at [facility]]   Followup - 3 months but after spirome /dlco and CT scan of the chest; 15-minute visit  - symptoms score and walk test at followup  - can decide HRCT followup at time of next visit

## 2024-05-15 ENCOUNTER — Ambulatory Visit (HOSPITAL_BASED_OUTPATIENT_CLINIC_OR_DEPARTMENT_OTHER)
Admission: RE | Admit: 2024-05-15 | Discharge: 2024-05-15 | Disposition: A | Discharge status: Home / Self Care | Source: Ambulatory Visit | Attending: Internal Medicine | Admitting: Internal Medicine

## 2024-05-15 DIAGNOSIS — Z8679 Personal history of other diseases of the circulatory system: Secondary | ICD-10-CM | POA: Diagnosis present

## 2024-05-15 DIAGNOSIS — Z9889 Other specified postprocedural states: Secondary | ICD-10-CM | POA: Diagnosis present

## 2024-05-15 DIAGNOSIS — K449 Diaphragmatic hernia without obstruction or gangrene: Secondary | ICD-10-CM | POA: Diagnosis present

## 2024-05-15 DIAGNOSIS — J849 Interstitial pulmonary disease, unspecified: Secondary | ICD-10-CM | POA: Insufficient documentation

## 2024-05-15 DIAGNOSIS — Z8709 Personal history of other diseases of the respiratory system: Secondary | ICD-10-CM | POA: Insufficient documentation

## 2024-05-15 DIAGNOSIS — E871 Hypo-osmolality and hyponatremia: Secondary | ICD-10-CM | POA: Insufficient documentation

## 2024-05-21 ENCOUNTER — Encounter: Payer: Self-pay | Admitting: Internal Medicine

## 2024-05-21 ENCOUNTER — Ambulatory Visit: Admitting: Internal Medicine

## 2024-05-21 VITALS — BP 108/72 | HR 79 | Ht 63.0 in | Wt 156.8 lb

## 2024-05-21 DIAGNOSIS — E871 Hypo-osmolality and hyponatremia: Secondary | ICD-10-CM

## 2024-05-21 DIAGNOSIS — Z87891 Personal history of nicotine dependence: Secondary | ICD-10-CM

## 2024-05-21 DIAGNOSIS — J849 Interstitial pulmonary disease, unspecified: Secondary | ICD-10-CM

## 2024-05-21 DIAGNOSIS — Z8679 Personal history of other diseases of the circulatory system: Secondary | ICD-10-CM

## 2024-05-21 DIAGNOSIS — K449 Diaphragmatic hernia without obstruction or gangrene: Secondary | ICD-10-CM

## 2024-05-21 DIAGNOSIS — Z8709 Personal history of other diseases of the respiratory system: Secondary | ICD-10-CM

## 2024-05-21 DIAGNOSIS — Z9889 Other specified postprocedural states: Secondary | ICD-10-CM

## 2024-05-21 LAB — PULMONARY FUNCTION TEST
DL/VA % pred: 73 %
DL/VA: 3.02 ml/min/mmHg/L
DLCO unc % pred: 71 %
DLCO unc: 12.85 ml/min/mmHg
FEF 25-75 Pre: 2.39 L/s
FEF2575-%Pred-Pre: 161 %
FEV1-%Pred-Pre: 123 %
FEV1-Pre: 2.35 L
FEV1FVC-%Pred-Pre: 106 %
FEV6-%Pred-Pre: 120 %
FEV6-Pre: 2.91 L
FEV6FVC-%Pred-Pre: 104 %
FVC-%Pred-Pre: 115 %
FVC-Pre: 2.96 L
Pre FEV1/FVC ratio: 79 %
Pre FEV6/FVC Ratio: 99 %

## 2024-05-21 NOTE — Progress Notes (Signed)
Spiro/DLCO performed today. 

## 2024-05-21 NOTE — Patient Instructions (Addendum)
 ILD (interstitial lung disease) (HCC) Hiatal hernia Status post Nissen fundoplication History of Raynaud's syndrome History of influenza and hyponatremia with confusion.  Admission February 2025.   - mild/early ILD since feb 2023 in setting of hiatal hernia and GERD; - unclear if this is IPF v non-IPF;  - clinically stable last 1 year but CT chest report June 2025 is pending   Plan -Do spirometry and DLCO in 4-5 months (will order this for [address] - any evidence of progression then will strngly consider anti fibrotic   Followup - 4-5 months but after spirome /dlco and CT scan of the chest; 15-minute visit  - symptoms score and walk test at followup  -

## 2024-05-21 NOTE — Patient Instructions (Signed)
Spiro/DLCO performed today. 

## 2024-05-21 NOTE — Progress Notes (Signed)
 OV 03/17/2022  Subjective:  Patient ID: Heather Lindsey, female , DOB: 07-08-1946 , age 78 y.o. , MRN: [id number] , ADDRESS: [address] [place] [address] PCP Lonzie Robins, MD Patient Care Team: Lonzie Robins, MD as PCP - General (Internal Medicine)  This Provider for this visit: Treatment Team:  Attending Provider: Maire Scot, MD    03/17/2022 -   Chief Complaint  Patient presents with   Consult    Pt is being seen due to recent CT.  Pt has no current complaints.     HPI Heather Lindsey 78 y.o. -referred by primary care physician Dr. Thor Fling for evaluation of possible interstitial lung disease.  Also sees Dr. 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  She tells me that she has had acid reflux for the last several years.  She is also had previous Raynaud's but review the records indicate detailed connective tissue disease work-up is negative.  As part of this she was diagnosed have hiatal hernia and then she had a CT scan that showed significant hiatal hernia then on 02/24/2022 she underwent hiatal hernia surgery by Dr. Raechel Bulla.  Specifically surgery was status post esophagoscopy and hiatal hernia repair with [facility] admission nisin fundoplication.  Before the surgery she was having cough and acid reflux.  Both the symptoms have resolved.  She has never had dyspnea on exertion except when climbing heavy set of stairs this thing is still persistent.  And it is only mild.  Overall she is doing well.  But she is here for the ILD.  She does do water  aerobics 3 times a week before the surgery and she never had any shortness of breath for that.  Specifically ILD question as below   [facility] Comprehensive ILD Questionnaire  Symptoms:      Past Medical History :  -Morphea sees rheumatology Acid reflux with hiatal hernia status post Nissen fundoplication mid March 2023 by Dr. Deloise Ferries Has osteoarthritis Has acid reflux controlled after hiatal hernia  surgery 2015 right frontal lobe brain bleed 1968 infectious mononucleosis Raynaud NOS   ROS:  Positive for fatigue and arthralgia.  Positive for dry eyes.  Positive for Raynaud's.  6 connective tissue disease autoimmune antibody panel negative.  Positive for acid reflux but now controlled after surgery  FAMILY HISTORY of LUNG DISEASE:  Denies family history of lung disease*  PERSONAL EXPOSURE HISTORY:  -Started smoking 1966.  Quit smoking 1971.  10 to 15 cigarettes a day.  No marijuana use no cocaine use.  No intravenous drug use  HOME  EXPOSURE and HOBBY DETAILS :  -[place].  The home itself is 77 years old she uses a steam iron but there is no mildew in it.  . Pt said that she used to have a cockatiel but he died years ago. Pt said that she does have a sofa that has feathers in it. ..  She does have feather pillow and sulfa  OCCUPATIONAL HISTORY (122 questions) : Detail organic and inorganic antigen exposure history is negative.  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  She has been on methotrexate for morphea 15 or 20 years ago and tries to avoid it.  She was on some prednisone 5 years ago.  INVESTIGATIONS:   She has had a CT chest with contrast: I personally visualized this  and showed it to her.  There is some ILD -there is a craniocaudal gradient.  It is very mild.  Is either postinflammatory or indeterminate for UIP pattern.  Any with a contrast CT was done in February 2023 before the hiatal hernia surgery   OV 08/18/2022  Subjective:  Patient ID: Heather Lindsey, female , DOB: 1946/03/01 , age 78 y.o. , MRN: [id number] , ADDRESS: [address] [place] [address] PCP Lonzie Robins, MD Patient Care Team: Lonzie Robins, MD as PCP - General (Internal Medicine)  This Provider for this visit: Treatment Team:   Attending Provider: Maire Scot, MD    08/18/2022 -   Chief Complaint  Patient presents with   Follow-up    PFT performed today.  Pt denies any complaints of SOB. Does have an occasional cough.     HPI Heather Lindsey 78 y.o. -returns for follow-up.  She presents with her husband who is a retired Corporate investment banker person who is involved with hemophilia medications.  She continues to feel stable.  She has very minimal shortness of breath with exertion.  She has some cough just like last time but she tells me it is actually acute and she feels she has allergies for the last few days.  She has not tested herself for COVID.  Currently there is widespread COVID in the community.  There is no fever or chills or other COVID symptoms.  In any event she is here for her ILD work-up.  She is not on any proton pump inhibitors.  Her pulmonary function test essentially normal except for a very slight reduction in DLCO.  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  Her serologic work-up was negative.    HRCT 07/20/22  Narrative & Impression  CLINICAL DATA:  Interstitial lung disease, former smoker   EXAM: CT CHEST WITHOUT CONTRAST   TECHNIQUE: Multidetector CT imaging of the chest was performed following the standard protocol without intravenous contrast. High resolution imaging of the lungs, as well as inspiratory and expiratory imaging, was performed.   RADIATION DOSE REDUCTION: This exam was performed according to the departmental dose-optimization program which includes automated exposure control, adjustment of the mA and/or kV according to patient size and/or use of iterative reconstruction technique.   COMPARISON:  01/27/2022   FINDINGS: Cardiovascular: Scattered aortic atherosclerosis. Normal heart size. No pericardial effusion.   Mediastinum/Nodes: No enlarged mediastinal, hilar, or axillary  lymph nodes. Small hiatal hernia. Thyroid gland, trachea, and esophagus demonstrate no significant findings.   Lungs/Pleura: Unchanged mild pulmonary fibrosis in a pattern with apical to basal gradient, featuring irregular peripheral interstitial opacity, septal thickening, traction bronchiectasis, and minimal subpleural bronchiolectasis at the bilateral lung bases. No significant air trapping on expiratory phase imaging. No pleural effusion or pneumothorax.   Upper Abdomen: No acute abnormality.   Musculoskeletal: No chest wall abnormality. No acute osseous findings.   IMPRESSION: 1. Unchanged mild pulmonary fibrosis in a pattern with apical to basal gradient, featuring irregular peripheral interstitial opacity, septal thickening, traction bronchiectasis, and minimal subpleural bronchiolectasis at the bilateral lung bases. 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 Am Annie Barton Crit Care Med Vol 198, Iss 5, [phone], Aug 12 2017. 2. Small hiatal hernia.   Aortic Atherosclerosis (ICD10-I70.0).     Electronically Signed   By: Fredricka Jenny M.D.   On: 07/21/2022 11:16  IMMUNOLOGY   Latest Reference Range & Units 03/02/22 13:38  Anti Nuclear Antibody (ANA) NEGATIVE  NEGATIVE  ANCA SCREEN Negative  Negative  Cyclic Citrullin Peptide Ab UNITS <16  ds DNA Ab IU/mL <1  Myeloperoxidase Abs <1.0 AI <1.0  Serine Protease 3 <1.0 AI <1.0  RA Latex Turbid. <14 IU/mL <14  ENA SM Ab Ser-aCnc <1.0 NEG AI <1.0 NEG  C3 Complement 83 - 193 mg/dL 621 (H)  C4 Complement 15 - 57 mg/dL 41  Ribonucleic Protein(ENA) Antibody, IgG <1.0 NEG AI <1.0 NEG  SSA (Ro) (ENA) Antibody, IgG <1.0 NEG AI <1.0 NEG  SSB (La) (ENA) Antibody, IgG <1.0 NEG AI <1.0 NEG  Scleroderma (Scl-70) (ENA) Antibody, IgG <1.0 NEG AI <1.0 NEG  (H): Data is abnormally high    OV  11/17/2022  Subjective:  Patient ID: Heather Lindsey, female , DOB: 03/03/1946 , age 72 y.o. , MRN: [id number] , ADDRESS: [address] [place] [address] PCP Lonzie Robins, MD Patient Care Team: Lonzie Robins, MD as PCP - General (Internal Medicine)  This Provider for this visit: Treatment Team:  Attending Provider: Maire Scot, MD    11/17/2022 -   Chief Complaint  Patient presents with   Follow-up    PFT performed today.  Pt states she has been doing okay since last visit and denies any complaints.     HPI Heather Lindsey 78 y.o. -returns for follow-up.  Since her last visit she did have COVID but she is doing well overall.  Symptom score is stable very minimal symptoms dyspnea score is just 1.  Her pulmonary function test shows stability/improvement.  Walking desaturation test was stable except for some tachycardia with walking.  There are no new complaints.  She has gotten rid of feather pillow.  Acid reflux under control.  She wants to have her Pneumovax vaccine.  She is only had Prevnar according to the chart.  She specifically asked for Pneumovax.  WE do not have stock   OV 07/04/2023  Subjective:  Patient ID: Heather Lindsey, female , DOB: [birth year] , age 36 y.o. , MRN: [id number] , ADDRESS: [address] [place] [address] PCP Lonzie Robins, MD Patient Care Team: Lonzie Robins, MD as PCP - General (Internal Medicine)  This Provider for this visit: Treatment Team:  Attending Provider: Maire Scot, MD    07/04/2023 -   Chief Complaint  Patient presents with   Follow-up    6 month f/up, no complaints     HPI Heather Lindsey 78 y.o. -she has negative serology from a year ago.  Did see Dr. Alvira Josephs 06/29/2023 for rheumatology consult.  Diagnosed to have morphea.  Raynaud's acknowledged.  Also followed up with GI PA on 06/30/2023.  At that time she reported dysphagia for 2 months.  They recommended barium swallow if no improvement with some  lifestyle modifications and taking PPI.  From a respiratory standpoint she continues to be stable.  No new symptoms. Interim Health status: No new complaints No new medical problems. No new surgeries. No ER visits. No Urgent care visits. No changes to medications.  Pulmonary function test appears stable today.       OV 02/13/2024  Subjective:  Patient ID: Heather Lindsey, female , DOB: 14-Aug-1946 , age 71 y.o. , MRN: [id number] , ADDRESS: [address] [place] [address] PCP Lonzie Robins, MD Patient Care Team: Lonzie Robins, MD as PCP - General (Internal Medicine)  This Provider for this visit: Treatment Team:  Attending Provider: Maire Scot, MD   02/13/2024 -   Chief Complaint  Patient presents with   Follow-up    Admitted to hospital with flu and pna 01/15/24-01/17/24. Breathing has returned to baseline for her. She has prod cough with clear sputum.      HPI Heather Lindsey 78 y.o. -returns for follow-up.  She is ILD on supportive care and expectant monitoring.  She had pulmonary function test end of January 2025 and shows multiyear stability.  However, shortly after that she got hospitalized for influenza.  I visualized the chest x-ray and there is probable pulmonary densities suggestive of viral pneumonia.  She also had low sodium at this time and was hallucinating.  She was not on oxygen in the hospital according to history.  She is returned back to baseline now.  She feels shortness of breath is at baseline but symptom score shows over the last few years there is a slight increase in dyspnea on exertion on stairs.  Exercise hypoxemia test did show a tendency to drop pulse ox by 3 points today.  Nevertheless she feels stable.  I did indicate to her that after a viral admission [she did have flu shot] patients are at high risk for progression in the following year.  Therefore she needs closer monitoring she is agreeable with this plan.  She did want her sodium levels rechecked  today.  Currently no hallucinations or balance issues or headaches or dizziness.          OV 05/21/2024  Subjective:  Patient ID: Heather Lindsey, female , DOB: Jan 03, 1946 , age 82 y.o. , MRN: [id number] , ADDRESS: [address] [place] [address] PCP Lonzie Robins, MD Patient Care Team: Lonzie Robins, MD as PCP - General (Internal Medicine)  This Provider for this visit: Treatment Team:  Attending Provider: Maire Scot, MD    05/21/2024 -   Chief Complaint  Patient presents with   Follow-up    follow up ILD ,  discuss results of PFT and Ct scan      Mild interstitial lung disease in the setting of bird for the exposure, hiatal hernia status post Nissen and also Raynaud's [facility] 2024 On expectant followup  HPI Heather Lindsey 78 y.o. -ILD on expectant approach.  Returns for short-term follow-up because of viral infection earlier this year.  She reports continued stability.  Subjectively.  Symptoms are very minimal.  Exercise hypoxemia test does show a tendency to desaturate but she does not go below 88%.  She did have spirometry and DLCO.  FVC stable.  The DLCO appears slightly down but then she did have similar values years ago.  She did have a high-resolution CT chest June 2024.  I personally visualized it and compared to the one from 2 years earlier.  To me they look the same but I am not sure 100%.  Waiting for the official report.  I shared these findings with her.  We decided to see each other again in 4 months with a breathing test but in the interim if there is evidence of progression then we would consider antifibrotic's.  Today as above family wished.  She is in alignment with the plan.       SYMPTOM SCALE - ILD 03/17/2022 08/18/2022  11/17/2022  07/04/2023  02/13/2024  05/21/2024   Current weight        O2 use ra ra ra ra ra ra  Shortness of Breath 0 -> 5 scale with 5  being worst (score 6 If unable to do)       At rest 0 0 0 0 0 0  Simple tasks -  showers, clothes change, eating, shaving 0 00 0 0 0 0  Household (dishes, doing bed, laundry) 0 0 0 0  0  Shopping 0 0 0 0 00 0  Walking level at own pace 0 0 0 0 0 0  Walking up Stairs 1.5 1 1 2 3 1   Total (30-36) Dyspnea Score 1.5 1 1 2 3 1   How bad is your cough? 2 2 0 0 1 1  How bad is your fatigue 3 3 2  2.5 1 1   How bad is nausea 0 0 0 0 0 0-  How bad is vomiting?  0 0 0 0 0 -0  How bad is diarrhea? 0 0 0 0 0 -0  How bad is anxiety? 3 2.5 1.5 2 0 10  How bad is depression 3 2 1.5 "SAD" wintder 1 1 1   Any chronic pain - if so where and how bad 3 x  x 0 0       Simple office walk 185 feet x  3 laps goal with forehead probe 03/17/2022  11/17/2022  02/13/2024   O2 used ra ra ra  Number laps completed 3 3 Sit sand stnd  15  Comments about pace avg    Resting Pulse Ox/HR 100% and 79/min 100% and HR 86  95% and HR 81  Final Pulse Ox/HR 100% and 1051/min 100% and HR 115 91% and HR 98  Desaturated </= 88% no non   Desaturated <= 3% points no no   Got Tachycardic >/= 90/min yes yes   Symptoms at end of test No complaitns No compalitns   Miscellaneous comments x x         SIT STAND TEST - goal 15 times   05/21/2024    O2 used ra   PRobe - finter or forehead finger   Number sit and stand completed - goal 15 15   Time taken to complete 1 min   Resting Pulse Ox/HR/Dyspnea  98% and 98/min and dyspnea of 0/10    Peak measures 90 % and 101/min and dyspnea of 1/10   Final Pulse Ox/HR 99% and 86/min and dyspnea of x/10   Desaturated </= 88% no   Desaturated <= 3% points yes   Got Tachycardic >/= 90/min yes   Miscellaneous comments x       PFT     Latest Ref Rng & Units 05/21/2024    3:02 PM 01/04/2024    2:00 PM 06/19/2023    7:27 AM 11/17/2022    1:01 PM 08/18/2022    8:57 AM  PFT Results  FVC-Pre L 2.96  P 2.85  2.69  2.93  2.88   FVC-Predicted Pre % 115  P 109  103  111  109   FVC-Post L     2.83   FVC-Predicted Post %     107   Pre FEV1/FVC % % 79  P 80  85  84  82    Post FEV1/FCV % %     82   FEV1-Pre L 2.35  P 2.29  2.27  2.46  2.36   FEV1-Predicted Pre % 123  P 118  117  124  119   FEV1-Post L     2.33   DLCO uncorrected ml/min/mmHg 12.85  P 13.48  15.01  14.26  12.89  DLCO UNC% % 71  P 74  82  78  71   DLCO corrected ml/min/mmHg  13.48  15.01  14.26  12.89   DLCO COR %Predicted %  74  82  78  71   DLVA Predicted % 73  P 74  84  74  67   TLC L     4.91   TLC % Predicted %     101   RV % Predicted %     80     P Preliminary result       LAB RESULTS last 96 hours No results found.       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   reports that she quit smoking about 52 years ago. Her smoking use included cigarettes. She started smoking about 56 years ago. She has a 2 pack-year smoking history. She has been exposed to tobacco smoke. She has never used smokeless tobacco.  Past Surgical History:  Procedure Laterality Date   ANKLE FRACTURE SURGERY Left    COLONOSCOPY     ESOPHAGEAL MANOMETRY N/A 12/29/2021   Procedure: ESOPHAGEAL MANOMETRY (EM);  Surgeon: Albertina Hugger, MD;  Location: [facility];  Service: Gastroenterology;  Laterality: N/A;  pt will stay on her PPI   ESOPHAGOGASTRODUODENOSCOPY N/A 02/24/2022   Procedure: ESOPHAGOGASTRODUODENOSCOPY (EGD);  Surgeon: Hilarie Lovely, MD;  Location: [facility] OR;  Service: Thoracic;  Laterality: N/A;   POLYPECTOMY     TOTAL HIP ARTHROPLASTY Left 01/06/2019   Procedure: TOTAL HIP ARTHROPLASTY ANTERIOR APPROACH;  Surgeon: Neil Balls, MD;  Location: [facility] ORS;  Service: Orthopedics;  Laterality: Left;   TUBAL LIGATION     XI ROBOTIC ASSISTED HIATAL HERNIA  REPAIR N/A 02/24/2022   Procedure: XI ROBOTIC ASSISTED HIATAL HERNIA REPAIR with Fundoplication using Myriad Matrix;  Surgeon: Hilarie Lovely, MD;  Location: [facility];  Service: Thoracic;  Laterality: N/A;    Allergies  Allergen Reactions   Actonel [Risedronate]     Severe chest pain   Alendronate Other (See Comments)    Severe chest pain   Boniva [Ibandronate]     Chest pain    Immunization History  Administered Date(s) Administered   Fluzone Influenza virus vaccine,trivalent (IIV3), split virus 09/02/2010, 11/15/2011, 09/25/2012   Influenza Split 11/02/2009, 11/25/2013, 09/30/2014   Influenza, High Dose Seasonal PF 08/16/2017, 10/21/2022   Influenza, Quadrivalent, Recombinant, Inj, Pf 09/08/2018, 09/12/2019, 10/07/2020, 10/06/2021   Influenza,inj,Quad PF,6+ Mos 08/20/2015   Influenza-Unspecified 09/12/2023   PFIZER(Purple Top)SARS-COV-2 Vaccination 12/28/2019, 01/18/2020, 08/07/2020   Pneumococcal Conjugate-13 11/25/2013   Tdap 10/15/2007   Zoster Recombinant(Shingrix) 03/01/2018, 06/17/2018   Zoster, Live 11/02/2009, 04/16/2013    Family History  Problem Relation Age of Onset   Heart disease Father    Liver cancer Paternal Aunt    Prostate cancer Paternal Grandfather    Colon cancer Neg Hx    Stomach cancer Neg Hx    Pancreatic cancer Neg Hx    Esophageal cancer Neg Hx    Rectal cancer Neg Hx    Colon polyps Neg Hx    Crohn's disease Neg Hx      Current Outpatient Medications:    acetaminophen  (TYLENOL ) 325 MG tablet, Take 2 tablets (650 mg total) by mouth every 6 (six) hours  as needed for mild pain., Disp: , Rfl:    ALPRAZolam  (XANAX ) 0.5 MG tablet, Take 0.25 mg by mouth at bedtime as needed for anxiety or sleep., Disp: , Rfl:    baclofen  (LIORESAL ) 10 MG tablet, TAKE 1 TABLET BY MOUTH DAILY AS  NEEDED FOR MUSCLE SPASM, Disp: 60 tablet, Rfl: 0   buPROPion  (WELLBUTRIN ) 100 MG tablet, Take 100 mg by mouth daily., Disp: , Rfl:    carboxymethylcellulose (REFRESH  PLUS) 0.5 % SOLN, Place 1 drop into both eyes 3 (three) times daily as needed (dry eyes)., Disp: , Rfl:    Cholecalciferol 50 MCG (2000 UT) CAPS, Take 2,000 Units by mouth daily., Disp: , Rfl:    denosumab  (PROLIA ) 60 MG/ML SOSY injection, Inject 60 mg into the skin every 6 (six) months., Disp: , Rfl:    diclofenac Sodium (VOLTAREN) 1 % GEL, Apply 1 application. topically daily as needed (pain)., Disp: , Rfl:    fluticasone  (FLONASE ) 50 MCG/ACT nasal spray, Place 1 spray into both nostrils daily., Disp: , Rfl:    hyoscyamine (LEVSIN) 0.125 MG tablet, Take 0.125 mg by mouth every 4 (four) hours as needed for bladder spasms or cramping., Disp: , Rfl:    loratadine  (CLARITIN ) 10 MG tablet, Take 10 mg by mouth daily as needed for allergies., Disp: , Rfl:    Multiple Vitamin (MULTI-VITAMIN DAILY PO), Take 1 tablet by mouth daily., Disp: , Rfl:    Olopatadine HCl 0.2 % SOLN, Place 1 drop into both eyes daily., Disp: , Rfl:    pantoprazole  (PROTONIX ) 40 MG tablet, Take 1 tablet (40 mg total) by mouth daily., Disp: 90 tablet, Rfl: 3   venlafaxine  XR (EFFEXOR -XR) 150 MG 24 hr capsule, Take 150 mg by mouth daily with breakfast., Disp: , Rfl:       Objective:   Vitals:   05/21/24 1549  BP: 108/72  Pulse: 79  SpO2: 98%  Weight: 156 lb 12.8 oz (71.1 kg)  Height: 5\' 3"  (1.6 m)    Estimated body mass index is 27.78 kg/m as calculated from the following:   Height as of this encounter: 5\' 3"  (1.6 m).   Weight as of this encounter: 156 lb 12.8 oz (71.1 kg).  @WEIGHTCHANGE @  [employer]   05/21/24 1549  Weight: 156 lb 12.8 oz (71.1 kg)     Physical Exam   General: No distress. Loos well O2 at rest: no Cane present: no Sitting in wheel chair: no Frail: no Obese: no Neuro: Alert and Oriented x 3. GCS 15. Speech normal Psych: Pleasant Resp:  Barrel Chest - no.  Wheeze - no, Crackles - yes mild base, No overt respiratory distress CVS: Normal heart sounds. Murmurs - no Ext: Stigmata of  Connective Tissue Disease - no HEENT: Normal upper airway. PEERL +. No post nasal drip        Assessment:       ICD-10-CM   1. ILD (interstitial lung disease) (HCC)  J84.9 Pulmonary function test    2. Hiatal hernia  K44.9 Pulmonary function test    3. Status post Nissen fundoplication  Z98.890 Pulmonary function test         Plan:     Patient Instructions  ILD (interstitial lung disease) (HCC) Hiatal hernia Status post Nissen fundoplication History of Raynaud's syndrome History of influenza and hyponatremia with confusion.  Admission February 2025.   - mild/early ILD since feb 2023 in setting of hiatal hernia and GERD; - unclear if this is IPF v  non-IPF;  - clinically stable last 1 year but CT chest report June 2025 is pending   Plan -Do spirometry and DLCO in 4-5 months (will order this for [address] - any evidence of progression then will strngly consider anti fibrotic   Followup - 4-5 months but after spirome /dlco and CT scan of the chest; 15-minute visit  - symptoms score and walk test at followup  -  FOLLOWUP Return in about 4 months (around 09/20/2024) for 15 min visit, after Spiro and DLCO, with Dr Bertrum Brodie, Face to Face Visit, with any of the APPS.    SIGNATURE    Dr. Maire Scot, M.D., F.C.C.P,  Pulmonary and Critical Care Medicine Staff Physician, [facility] Center Director - Interstitial Lung Disease  Program  Pulmonary Fibrosis [facility] [place], [place], [address]  Pager: [phone], If no answer or between  15:00h - 7:00h: call [phone] Telephone: [phone]  4:39 PM 05/21/2024

## 2024-06-07 ENCOUNTER — Telehealth: Payer: Self-pay | Admitting: Pharmacy Technician

## 2024-06-07 NOTE — Telephone Encounter (Signed)
 Auth Submission: APPROVED Site of care: Site of care: [facility] Payer: [employer] MEDICARE Medication & CPT/J Code(s) submitted: Prolia  (Denosumab ) [id number] Diagnosis Code:  Route of submission (phone, fax, portal):  Phone # Fax # Auth type: Buy/Bill HB Units/visits requested: 60MG  Q6MONTHS X2 DOSES Reference number: [id number] Approval from: 06/07/24 to 06/07/25

## 2024-06-12 ENCOUNTER — Other Ambulatory Visit (HOSPITAL_COMMUNITY): Payer: Self-pay | Admitting: *Deleted

## 2024-06-17 ENCOUNTER — Ambulatory Visit (HOSPITAL_COMMUNITY)
Admission: RE | Admit: 2024-06-17 | Discharge: 2024-06-17 | Disposition: A | Discharge status: Home / Self Care | Source: Ambulatory Visit | Attending: Internal Medicine | Admitting: Internal Medicine

## 2024-06-17 DIAGNOSIS — M810 Age-related osteoporosis without current pathological fracture: Secondary | ICD-10-CM | POA: Insufficient documentation

## 2024-06-17 MED ORDER — DENOSUMAB 60 MG/ML ~~LOC~~ SOSY
PREFILLED_SYRINGE | SUBCUTANEOUS | Status: AC
  Filled 2024-06-17: qty 1

## 2024-06-17 MED ORDER — DENOSUMAB 60 MG/ML ~~LOC~~ SOSY
60.0000 mg | PREFILLED_SYRINGE | Freq: Once | SUBCUTANEOUS | Status: AC
  Administered 2024-06-17: 60 mg via SUBCUTANEOUS

## 2024-06-18 NOTE — Progress Notes (Unsigned)
 Office Visit Note  Patient: Heather Lindsey             Date of Birth: 01/19/1946           MRN: [id number]             PCP: Janey Santos, MD Referring: Janey Santos, MD Visit Date: 07/02/2024 Occupation: @GUAROCC @  Subjective:  Morphea  History of Present Illness: Heather Lindsey is a 78 y.o. 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  She returns today after her last visit in January 2025. Patient was admitted in February 2025 with cough confusion and hyponatremia.  She also had influenza A infection community-acquired pneumonia. Patient was evaluated by Dr. Geronimo on May 21, 2024.  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  He ordered CT scan of the chest which showed mild progression on May 15, 2024 when compared to the previous CT scan of 2023.  Small hiatal hernia and aortic atherosclerosis was also noted. She was on methotrexate for morphea until 2020 and it was discontinued due to COVID-19 pandemic.  She has noticed 2 new lesions 1 in the left axillary area and 1 over the arm.  She wants to restart methotrexate.  She states she is taking methotrexate for many years without any side effects.  She denies any symptoms of Raynaud's phenomenon or skin tightness.  She continues to have some pain and stiffness in her both hands and right trochanteric region.  She remains on Prolia  injections per Dr. Janey.  Activities of Daily Living:  Patient reports morning stiffness for 24 hours.   Patient Reports nocturnal pain.  Difficulty dressing/grooming: Reports Difficulty climbing stairs: Denies Difficulty getting out of chair: Denies Difficulty using hands for taps, buttons, cutlery, and/or writing: Reports  Review of Systems  Constitutional:  Positive for fatigue.  HENT:  Positive for mouth dryness. Negative for mouth sores.   Eyes:  Positive for dryness.  Respiratory:  Negative for shortness of breath.   Cardiovascular:  Negative for chest pain and palpitations.   Gastrointestinal:  Negative for blood in stool, constipation and diarrhea.  Endocrine: Negative for increased urination.  Genitourinary:  Negative for involuntary urination.  Musculoskeletal:  Positive for joint pain, joint pain, joint swelling and morning stiffness. Negative for gait problem, myalgias, muscle weakness, muscle tenderness and myalgias.  Skin:  Negative for color change, rash, hair loss and sensitivity to sunlight.  Allergic/Immunologic: Negative for susceptible to infections.  Neurological:  Positive for dizziness. Negative for headaches.  Hematological:  Negative for swollen glands.  Psychiatric/Behavioral:  Positive for depressed mood. Negative for sleep disturbance. The patient is nervous/anxious.     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   HYPERCHOLESTEROLEMIA 11/18/2008   Depression 11/18/2008   IRRITABLE BOWEL SYNDROME 11/18/2008   Morphea 11/18/2008   Primary osteoarthritis of both hands 11/18/2008   Age related osteoporosis 11/18/2008   MIGRAINES, HX OF 11/18/2008    Past Medical History:  Diagnosis Date  Allergy    SEASONAL   Anxiety    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   Brain bleed (HCC)    Broken wrist    LEFT,2006   Cataract    BILATERAL NO SURGERY YET UPDATED 06/09/22   Circumscribed scleroderma    Depression    Diverticulosis    GERD (gastroesophageal reflux disease)     Hyperlipidemia    IBS (irritable bowel syndrome)    Memory loss    Migraines    Osteoarthritis    Osteoporosis    Stroke (HCC) 2015    Family History  Problem Relation Age of Onset   Heart disease Father    Liver cancer Paternal Aunt    Prostate cancer Paternal Grandfather    Colon cancer Neg Hx    Stomach cancer Neg Hx    Pancreatic cancer Neg Hx    Esophageal cancer Neg Hx    Rectal cancer Neg Hx    Colon polyps Neg Hx    Crohn's disease Neg Hx    Past Surgical History:  Procedure Laterality Date   ANKLE FRACTURE SURGERY Left    COLONOSCOPY     ESOPHAGEAL MANOMETRY N/A 12/29/2021   Procedure: ESOPHAGEAL MANOMETRY (EM);  Surgeon: Legrand Victory LITTIE DOUGLAS, MD;  Location: [facility];  Service: Gastroenterology;  Laterality: N/A;  pt will stay on her PPI   ESOPHAGOGASTRODUODENOSCOPY N/A 02/24/2022   Procedure: ESOPHAGOGASTRODUODENOSCOPY (EGD);  Surgeon: Shyrl Linnie KIDD, MD;  Location: [facility] OR;  Service: Thoracic;  Laterality: N/A;   POLYPECTOMY     TOTAL HIP ARTHROPLASTY Left 01/06/2019   Procedure: TOTAL HIP ARTHROPLASTY ANTERIOR APPROACH;  Surgeon: Yvone Rush, MD;  Location: [facility];  Service: Orthopedics;  Laterality: Left;   TUBAL LIGATION     XI ROBOTIC ASSISTED HIATAL HERNIA REPAIR N/A 02/24/2022   Procedure: XI ROBOTIC ASSISTED HIATAL HERNIA REPAIR with Fundoplication using Myriad Matrix;  Surgeon: Shyrl Linnie KIDD, MD;  Location: [facility] OR;  Service: Thoracic;  Laterality: N/A;   Social History   Social History Narrative   Daily caffeine, 2 glasses daily,   Right handed, Married, 2 sons. Retired. College.   Immunization History  Administered Date(s) Administered   Fluzone Influenza virus vaccine,trivalent (IIV3), split virus 09/02/2010, 11/15/2011, 09/25/2012   Influenza Split 11/02/2009, 11/25/2013, 09/30/2014   Influenza, High Dose Seasonal PF 08/16/2017, 10/21/2022   Influenza, Quadrivalent, Recombinant, Inj, Pf 09/08/2018, 09/12/2019, 10/07/2020, 10/06/2021    Influenza,inj,Quad PF,6+ Mos 08/20/2015   Influenza-Unspecified 09/12/2023   PFIZER(Purple Top)SARS-COV-2 Vaccination 12/28/2019, 01/18/2020, 08/07/2020   Pneumococcal Conjugate-13 11/25/2013   Tdap 10/15/2007   Zoster Recombinant(Shingrix) 03/01/2018, 06/17/2018   Zoster, Live 11/02/2009, 04/16/2013     Objective: Vital Signs: BP 109/69 (BP Location: Left Arm, Patient Position: Sitting, Cuff Size: Normal)   Pulse 78   Resp 15   Ht 5' 3 (1.6 m)   Wt 155 lb (70.3 kg)   BMI 27.46 kg/m    Physical Exam Vitals and nursing note reviewed.  Constitutional:      Appearance: She is well-developed.  HENT:     Head: Normocephalic and atraumatic.  Eyes:     Conjunctiva/sclera: Conjunctivae normal.  Cardiovascular:     Rate and Rhythm: Normal rate and regular rhythm.     Heart sounds: Normal heart sounds.  Pulmonary:     Effort: Pulmonary effort is normal.     Breath sounds: Normal breath sounds.  Abdominal:     General: Bowel sounds are normal.  Palpations: Abdomen is soft.  Musculoskeletal:     Cervical back: Normal range of motion.  Lymphadenopathy:     Cervical: No cervical adenopathy.  Skin:    General: Skin is warm and dry.     Capillary Refill: Capillary refill takes less than 2 seconds.     Comments: No sclerodactyly or nailbed capillary changes were noted.  She had good capillary refill without any telangiectasia.  Morphea lesions were noted on her torso.  2 new lesions were noted on her left arm.  Neurological:     Mental Status: She is alert and oriented to person, place, and time.  Psychiatric:        Behavior: Behavior normal.      Musculoskeletal Exam: Cervical, thoracic and lumbar spine with good range of motion.  Shoulders, elbows, wrist joints, MCPs PIPs and DIPs Juengel range of motion.  She had some inflammation in her right second PIP joint.  CMC, PIP and DIP thickening was noted bilaterally.  Left hip joint was replaced and was in good range of motion.   She had good range of motion of her right hip joint.  Knee joints in good range of motion.  She had no tenderness over her ankles or MTPs.  CDAI Exam: CDAI Score: -- Patient Global: --; Provider Global: -- Swollen: --; Tender: -- Joint Exam 07/02/2024   No joint exam has been documented for this visit   There is currently no information documented on the homunculus. Go to the Rheumatology activity and complete the homunculus joint exam.  Investigation: No additional findings.  Imaging: No results found.  Recent Labs: Lab Results  Component Value Date   WBC 5.6 01/17/2024   HGB 12.2 01/17/2024   PLT 184 01/17/2024   NA 138 02/13/2024   K 4.6 02/13/2024   CL 103 02/13/2024   CO2 27 02/13/2024   GLUCOSE 94 02/13/2024   BUN 12 02/13/2024   CREATININE 0.95 02/13/2024   BILITOT 0.4 01/15/2024   ALKPHOS 47 01/15/2024   AST 18 01/15/2024   ALT 10 01/15/2024   PROT 6.4 (L) 01/15/2024   ALBUMIN 3.9 01/15/2024   CALCIUM  9.6 02/13/2024   GFRAA 71 03/18/2021    Speciality Comments: No specialty comments available.  Procedures:  No procedures performed Allergies: Abilify  [aripiprazole ], Actonel [risedronate], Alendronate, and Boniva [ibandronate]   Assessment / Plan:     Visit Diagnoses: Morphea -patient has longstanding history of morphea.  She was under care of Dr. Jorizzo in the past and was treated with methotrexate for a long time.  She had good response to methotrexate.  She came off methotrexate in 2020 after COVID-19 pandemic.  She states recently she has been noticing new lesions of morphea.  She wants to restart methotrexate.  Patient has noticed a new left axillary lesion and left upper arm lesion.  I did detailed discussion with the patient regarding the side effects of methotrexate and increased risk of infection.  She stated that she wants to take a chance and wants to stop the spread of morphea.  Indications, side effects and contraindications of methotrexate were  discussed at length.  Handout was given and consent was taken.  She will be starting methotrexate 3 tablets p.o. weekly along with folic acid  1 mg p.o. daily once the lab results are available.  I have also sent a note to Dr. 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  Drug Counseling TB Gold: Pending Hepatitis panel: Pending  Chest-xray:  Patient had chest x-ray and high-resolution CT.  Alcohol use: None  Patient was counseled on the purpose, proper use, and adverse effects of methotrexate including nausea, infection, and signs and symptoms of pneumonitis.  Reviewed instructions with patient to take methotrexate weekly along with folic acid  daily.  Discussed the importance of frequent monitoring of kidney and liver function and blood counts, and provided patient with standing lab instructions.  Counseled patient to avoid NSAIDs and alcohol while on methotrexate.  Provided patient with educational materials on methotrexate and answered all questions.  Advised patient to get annual influenza vaccine and to get a pneumococcal vaccine if patient has not already had one.  Patient voiced understanding.  Patient consented to methotrexate use.  Will upload into chart.     High risk medication use - Plan: CBC with Differential/Platelet, Comprehensive metabolic panel with GFR, Hepatitis B surface antigen, Hepatitis B core antibody, IgM, Hepatitis C antibody, QuantiFERON-TB Gold Plus, Serum protein electrophoresis with reflex, IgG, IgA, IgM.  Patient was advised to get labs in 2 weeks, 2 months and then every 3 months after starting methotrexate.  Information reimmunization was placed with the AVS.  She was advised to stop methotrexate if she develops an infection and resume after the infection resolves.  Raynaud's phenomenon without gangrene-patient denies Raynaud's symptoms.  Primary osteoarthritis of both hands-she had bilateral CMC, PIP and DIP thickening.  Inflammation was  noted in the right second PIP joint.  Joint protection was discussed.  Status post total hip replacement, left-she had good range of motion without discomfort.  Trochanteric bursitis of right hip-she continues to have off-and-on discomfort.  Abnormal chest CT- 1. Basilar predominant interstitial lung disease, as detailed above, slightly progressive from 07/20/2022. Findings are categorized as probable UIP per consensus guidelines: Diagnosis of Idiopathic Pulmonary Fibrosis: An Official ATS/ERS/JRS/ALAT Clinical Practice Guideline. Am JINNY Honey Crit Care Med Vol 198, Iss 5, [phone], Aug 12 2017. 2. Small hiatal hernia. 3. Aortic atherosclerosis (ICD10-I70.0). Coronary artery calcification.  Electronically Signed   By: Newell Eke M.D.   On: 05/26/2024 15:29 Patient will follow-up with Dr. Tonna.  Age-related osteoporosis without current pathological fracture - She is on Prolia  injections by Dr. Janey.  Patient states she had an injection this month.  History of gastroesophageal reflux (GERD)-currently asymptomatic.  Other medical problems are listed as follows:  History of IBS  Hiatal hernia  History of hypercholesterolemia  History of migraine  History of depression  Orders: Orders Placed This Encounter  Procedures   CBC with Differential/Platelet   Comprehensive metabolic panel with GFR   Hepatitis B surface antigen   Hepatitis B core antibody, IgM   Hepatitis C antibody   QuantiFERON-TB Gold Plus   Serum protein electrophoresis with reflex   IgG, IgA, IgM   No orders of the defined types were placed in this encounter.    Follow-Up Instructions: Return in about 3 months (around 10/02/2024) for Morphea, osteoarthritis.   Maya Nash, MD  Note - This record has been created using Animal nutritionist.  Chart creation errors have been sought, but may not always  have been located. Such creation errors do not reflect on  the standard of medical care.

## 2024-07-02 ENCOUNTER — Encounter: Payer: Self-pay | Admitting: Rheumatology

## 2024-07-02 ENCOUNTER — Ambulatory Visit: Payer: Medicare Other | Attending: Rheumatology | Admitting: Rheumatology

## 2024-07-02 VITALS — BP 109/69 | HR 78 | Resp 15 | Ht 63.0 in | Wt 155.0 lb

## 2024-07-02 DIAGNOSIS — Z8669 Personal history of other diseases of the nervous system and sense organs: Secondary | ICD-10-CM

## 2024-07-02 DIAGNOSIS — R9389 Abnormal findings on diagnostic imaging of other specified body structures: Secondary | ICD-10-CM

## 2024-07-02 DIAGNOSIS — Z79899 Other long term (current) drug therapy: Secondary | ICD-10-CM

## 2024-07-02 DIAGNOSIS — Z8639 Personal history of other endocrine, nutritional and metabolic disease: Secondary | ICD-10-CM

## 2024-07-02 DIAGNOSIS — I7300 Raynaud's syndrome without gangrene: Secondary | ICD-10-CM | POA: Diagnosis not present

## 2024-07-02 DIAGNOSIS — Z96642 Presence of left artificial hip joint: Secondary | ICD-10-CM

## 2024-07-02 DIAGNOSIS — L940 Localized scleroderma [morphea]: Secondary | ICD-10-CM | POA: Diagnosis not present

## 2024-07-02 DIAGNOSIS — M19041 Primary osteoarthritis, right hand: Secondary | ICD-10-CM | POA: Diagnosis not present

## 2024-07-02 DIAGNOSIS — M7061 Trochanteric bursitis, right hip: Secondary | ICD-10-CM

## 2024-07-02 DIAGNOSIS — M19042 Primary osteoarthritis, left hand: Secondary | ICD-10-CM

## 2024-07-02 DIAGNOSIS — K449 Diaphragmatic hernia without obstruction or gangrene: Secondary | ICD-10-CM

## 2024-07-02 DIAGNOSIS — Z8719 Personal history of other diseases of the digestive system: Secondary | ICD-10-CM

## 2024-07-02 DIAGNOSIS — M810 Age-related osteoporosis without current pathological fracture: Secondary | ICD-10-CM

## 2024-07-02 DIAGNOSIS — Z8659 Personal history of other mental and behavioral disorders: Secondary | ICD-10-CM

## 2024-07-02 NOTE — Patient Instructions (Signed)
 Methotrexate Tablets What is this medication? METHOTREXATE (METH oh TREX ate) treats autoimmune conditions, such as arthritis and psoriasis. It works by decreasing inflammation, which can reduce pain and prevent long-term injury to the joints and skin. It may also be used to treat some types of cancer. It works by slowing down the growth of cancer cells. This medicine may be used for other purposes; ask your health care provider or pharmacist if you have questions. COMMON BRAND NAME(S): Rheumatrex, Trexall What should I tell my care team before I take this medication? They need to know if you have any of these conditions: Dehydration Diabetes Fluid in the stomach area or lungs Frequently drink alcohol Having surgery, including dental surgery High cholesterol Immune system problems Inflammatory bowel disease, such as ulcerative colitis Kidney disease Liver disease Low blood cell levels (white cells, red cells, and platelets) Lung disease Recent or ongoing radiation Recent or upcoming vaccine Stomach ulcers, other stomach or intestine problems An unusual or allergic reaction to methotrexate, other medications, foods, dyes, or preservatives Pregnant or trying to get pregnant Breastfeeding How should I use this medication? Take this medication by mouth with water . Take it as directed on the prescription label. Do not take extra. Keep taking this medication until your care team tells you to stop. Know why you are taking this medication and how you should take it. To treat conditions such as arthritis and psoriasis, this medication is taken ONCE A WEEK as a single dose or divided into 3 smaller doses taken 12 hours apart (do not take more than 3 doses 12 hours apart each week). This medication is NEVER taken daily to treat conditions other than cancer. Taking this medication more often than directed can cause serious side effects, even death. Talk to your care team about why you are taking this  medication, how often you will take it, and what your dose is. Ask your care team to put the reason you take this medication on the prescription. If you take this medication ONCE A WEEK, choose a day of the week before you start. Ask your pharmacist to include the day of the week on the label. Avoid Monday, which could be misread as Morning. Handling this medication may be harmful. Talk to your care team about how to handle this medication. Special instructions may apply. Talk to your care team about the use of this medication in children. While it may be prescribed for selected conditions, precautions do apply. Overdosage: If you think you have taken too much of this medicine contact a poison control center or emergency room at once. NOTE: This medicine is only for you. Do not share this medicine with others. What if I miss a dose? If you miss a dose, talk with your care team. Do not take double or extra doses. What may interact with this medication? Do not take this medication with any of the following: Acitretin Live virus vaccines Probenecid This medication may also interact with the following: Alcohol Aspirin  and aspirin -like medications Certain antibiotics, such as penicillin, neomycin, sulfamethoxazole; trimethoprim Certain medications for stomach problems, such as lansoprazole, omeprazole, pantoprazole  Clozapine Cyclosporine Dapsone Folic acid  Foscarnet NSAIDs, medications for pain and inflammation, such as ibuprofen or naproxen Phenytoin Pyrimethamine Steroid medications, such as prednisone or cortisone Tacrolimus Theophylline This list may not describe all possible interactions. Give your health care provider a list of all the medicines, herbs, non-prescription drugs, or dietary supplements you use. Also tell them if you smoke, drink alcohol, or use  illegal drugs. Some items may interact with your medicine. What should I watch for while using this medication? Visit your  care team for regular checks on your progress. It may be some time before you see the benefit from this medication. You may need blood work done while you are taking this medication. If your care team has also prescribed folic acid , they may instruct you to skip your folic acid  dose on the day you take methotrexate. This medication can make you more sensitive to the sun. Keep out of the sun. If you cannot avoid being in the sun, wear protective clothing and sunscreen. Do not use sun lamps, tanning beds, or tanning booths. Check with your care team if you have severe diarrhea, nausea, and vomiting, or if you sweat a lot. The loss of too much body fluid may make it dangerous for you to take this medication. This medication may increase your risk of getting an infection. Call your care team for advice if you get a fever, chills, sore throat, or other symptoms of a cold or flu. Do not treat yourself. Try to avoid being around people who are sick. Talk to your care team about your risk of cancer. You may be more at risk for certain types of cancers if you take this medication. Talk to your care team if you or your partner may be pregnant. Serious birth defects can occur if you take this medication during pregnancy and for 6 months after the last dose. You will need a negative pregnancy test before starting this medication. Contraception is recommended while taking this medication and for 6 months after the last dose. Your care team can help you find the option that works for you. If your partner can get pregnant, use a condom during sex while taking this medication and for 3 months after the last dose. Do not breastfeed while taking this medication and for 1 week after the last dose. This medication may cause infertility. Talk to your care team if you are concerned about your fertility. What side effects may I notice from receiving this medication? Side effects that you should report to your care team as soon  as possible: Allergic reactions--skin rash, itching, hives, swelling of the face, lips, tongue, or throat Dry cough, shortness of breath or trouble breathing Infection--fever, chills, cough, sore throat, wounds that don't heal, pain or trouble when passing urine, general feeling of discomfort or being unwell Kidney injury--decrease in the amount of urine, swelling of the ankles, hands, or feet Liver injury--right upper belly pain, loss of appetite, nausea, light-colored stool, dark yellow or brown urine, yellowing skin or eyes, unusual weakness or fatigue Low red blood cell level--unusual weakness or fatigue, dizziness, headache, trouble breathing Pain, tingling, or numbness in the hands or feet, muscle weakness, change in vision, confusion or trouble speaking, loss of balance or coordination, trouble walking, seizures Redness, blistering, peeling, or loosening of the skin, including inside the mouth Stomach bleeding--bloody or black, tar-like stools, vomiting blood or brown material that looks like coffee grounds Stomach pain that is severe, does not go away, or gets worse Unusual bruising or bleeding Side effects that usually do not require medical attention (report these to your care team if they continue or are bothersome): Diarrhea Dizziness Hair loss Nausea Pain, redness, or swelling with sores inside the mouth or throat Skin reactions on sun-exposed areas Vomiting This list may not describe all possible side effects. Call your doctor for medical advice about side effects.  You may report side effects to FDA at 1-800-FDA-1088. Where should I keep my medication? Keep out of the reach of children and pets. Store at room temperature between 20 and 25 degrees C (68 and 77 degrees F). Protect from light. Keep the container tightly closed. Get rid of any unused medication after the expiration date. To get rid of medications that are no longer needed or have expired: Take the medication to a  medication take-back program. Check with your pharmacy or law enforcement to find a location. If you cannot return the medication, ask your pharmacist or care team how to get rid of this medication safely. NOTE: This sheet is a summary. It may not cover all possible information. If you have questions about this medicine, talk to your doctor, pharmacist, or health care provider.  2024 Elsevier/Gold Standard (2023-11-10 00:00:00)  Standing Labs We placed an order today for your standing lab work.   Please have your standing labs drawn in 2 weeks after starting methotrexate, 2 months and then every 3 months  Please have your labs drawn 2 weeks prior to your appointment so that the provider can discuss your lab results at your appointment, if possible.  Please note that you may see your imaging and lab results in MyChart before we have reviewed them. We will contact you once all results are reviewed. Please allow our office up to 72 hours to thoroughly review all of the results before contacting the office for clarification of your results.  WALK-IN LAB HOURS  Monday through Thursday from 8:00 am -12:30 pm and 1:00 pm-4:30 pm and Friday from 8:00 am-12:00 pm.  Patients with office visits requiring labs will be seen before walk-in labs.  You may encounter longer than normal wait times. Please allow additional time. Wait times may be shorter on  Monday and Thursday afternoons.  We do not book appointments for walk-in labs. We appreciate your patience and understanding with our staff.   Labs are drawn by [facility]. Please bring your co-pay at the time of your lab draw.  You may receive a bill from [facility] for your lab work.  Please note if you are on Hydroxychloroquine and and an order has been placed for a Hydroxychloroquine level,  you will need to have it drawn 4 hours or more after your last dose.  If you wish to have your labs drawn at another location, please call the office 24 hours in advance  so we can fax the orders.  The office is located at [address], [address], [place], [place] [address]   If you have any questions regarding directions or hours of operation,  please call [phone].   As a reminder, please drink plenty of water  prior to coming for your lab work. Thanks!   Vaccines You are taking a medication(s) that can suppress your immune system.  The following immunizations are recommended: Flu annually Covid-19  Td/Tdap (tetanus, diphtheria, pertussis) every 10 years Pneumonia (Prevnar 15 then Pneumovax 23 at least 1 year apart.  Alternatively, can take Prevnar 20 without needing additional dose) Shingrix: 2 doses from 4 weeks to 6 months apart  Please check with your PCP to make sure you are up to date.   If you have signs or symptoms of an infection or start antibiotics: First, call your PCP for workup of your infection. Hold your medication through the infection, until you complete your antibiotics, and until symptoms resolve if you take the following: Injectable medication (Actemra, Benlysta, Cimzia,  Cosentyx, Enbrel, Humira, Kevzara, Orencia, Remicade, Simponi, Stelara, Taltz, Tremfya) Methotrexate Leflunomide (Arava) Mycophenolate (Cellcept) Earma Jewel, or Rinvoq

## 2024-07-03 ENCOUNTER — Ambulatory Visit: Payer: Self-pay | Admitting: Rheumatology

## 2024-07-03 DIAGNOSIS — Z79899 Other long term (current) drug therapy: Secondary | ICD-10-CM

## 2024-07-03 NOTE — Progress Notes (Signed)
 CBC normal, creatinine is mildly elevated.  Patient should increase water  intake.  Immunoglobulins normal except low IgM, which is not significant.  Hepatitis B and hepatitis C nonreactive.  SPEP and TB Gold pending.  We will send prescription for methotrexate once TB Gold results are available.

## 2024-07-08 LAB — IGG, IGA, IGM
IgG (Immunoglobin G), Serum: 697 mg/dL (ref 600–1540)
IgM, Serum: 30 mg/dL — ABNORMAL LOW (ref 50–300)
Immunoglobulin A: 228 mg/dL (ref 70–320)

## 2024-07-08 LAB — COMPREHENSIVE METABOLIC PANEL WITH GFR
AG Ratio: 2.1 (calc) (ref 1.0–2.5)
ALT: 12 U/L (ref 6–29)
AST: 18 U/L (ref 10–35)
Albumin: 4.5 g/dL (ref 3.6–5.1)
Alkaline phosphatase (APISO): 53 U/L (ref 37–153)
BUN/Creatinine Ratio: 15 (calc) (ref 6–22)
BUN: 16 mg/dL (ref 7–25)
CO2: 26 mmol/L (ref 20–32)
Calcium: 9.4 mg/dL (ref 8.6–10.4)
Chloride: 98 mmol/L (ref 98–110)
Creat: 1.08 mg/dL — ABNORMAL HIGH (ref 0.60–1.00)
Globulin: 2.1 g/dL (ref 1.9–3.7)
Glucose, Bld: 79 mg/dL (ref 65–99)
Potassium: 4.3 mmol/L (ref 3.5–5.3)
Sodium: 136 mmol/L (ref 135–146)
Total Bilirubin: 0.3 mg/dL (ref 0.2–1.2)
Total Protein: 6.6 g/dL (ref 6.1–8.1)
eGFR: 53 mL/min/1.73m2 — ABNORMAL LOW (ref >=60)

## 2024-07-08 LAB — PROTEIN ELECTROPHORESIS, SERUM, WITH REFLEX
Albumin ELP: 4.1 g/dL (ref 3.8–4.8)
Alpha 1: 0.3 g/dL (ref 0.2–0.3)
Alpha 2: 0.8 g/dL (ref 0.5–0.9)
Beta 2: 0.4 g/dL (ref 0.2–0.5)
Beta Globulin: 0.4 g/dL (ref 0.4–0.6)
Gamma Globulin: 0.6 g/dL — ABNORMAL LOW (ref 0.8–1.7)
Total Protein: 6.7 g/dL (ref 6.1–8.1)

## 2024-07-08 LAB — CBC WITH DIFFERENTIAL/PLATELET
Absolute Lymphocytes: 1846 {cells}/uL (ref 850–3900)
Absolute Monocytes: 826 {cells}/uL (ref 200–950)
Basophils Absolute: 39 {cells}/uL (ref 0–200)
Basophils Relative: 0.6 %
Eosinophils Absolute: 111 {cells}/uL (ref 15–500)
Eosinophils Relative: 1.7 %
HCT: 38.6 % (ref 35.0–45.0)
Hemoglobin: 12.4 g/dL (ref 11.7–15.5)
MCH: 29 pg (ref 27.0–33.0)
MCHC: 32.1 g/dL (ref 32.0–36.0)
MCV: 90.2 fL (ref 80.0–100.0)
MPV: 9.7 fL (ref 7.5–12.5)
Monocytes Relative: 12.7 %
Neutro Abs: 3679 {cells}/uL (ref 1500–7800)
Neutrophils Relative %: 56.6 %
Platelets: 238 Thousand/uL (ref 140–400)
RBC: 4.28 Million/uL (ref 3.80–5.10)
RDW: 12.9 % (ref 11.0–15.0)
Total Lymphocyte: 28.4 %
WBC: 6.5 Thousand/uL (ref 3.8–10.8)

## 2024-07-08 LAB — HEPATITIS B SURFACE ANTIGEN: Hepatitis B Surface Ag: NONREACTIVE

## 2024-07-08 LAB — QUANTIFERON-TB GOLD PLUS
Mitogen-NIL: >10 [IU]/mL
NIL: 0.04 [IU]/mL
QuantiFERON-TB Gold Plus: NEGATIVE
TB1-NIL: <0 [IU]/mL
TB2-NIL: <0 [IU]/mL

## 2024-07-08 LAB — HEPATITIS C ANTIBODY: Hepatitis C Ab: NONREACTIVE

## 2024-07-08 LAB — HEPATITIS B CORE ANTIBODY, IGM: Hep B C IgM: NONREACTIVE

## 2024-07-08 LAB — IFE INTERPRETATION

## 2024-07-09 NOTE — Progress Notes (Signed)
 IFE negative, TB Gold is negative.  We can send the prescription for methotrexate  3 tablets p.o. q. weekly along with folic acid  1 mg p.o. daily.  I would not increase the dose of methotrexate  as her renal function is elevated.  She will have repeat labs in 2 weeks, 2 months and then every 3 months.  Please advise patient that methotrexate  can  decrease her renal function and increase her liver function.  Her labs need to be closely monitored.

## 2024-07-11 MED ORDER — METHOTREXATE SODIUM 2.5 MG PO TABS: 7.5000 mg | ORAL_TABLET | ORAL | 2 refills | Status: DC

## 2024-07-29 ENCOUNTER — Other Ambulatory Visit: Payer: Self-pay

## 2024-07-29 DIAGNOSIS — Z79899 Other long term (current) drug therapy: Secondary | ICD-10-CM

## 2024-07-30 LAB — CBC WITH DIFFERENTIAL/PLATELET
Absolute Lymphocytes: 1854 {cells}/uL (ref 850–3900)
Absolute Monocytes: 996 {cells}/uL — ABNORMAL HIGH (ref 200–950)
Basophils Absolute: 30 {cells}/uL (ref 0–200)
Basophils Relative: 0.4 %
Eosinophils Absolute: 114 {cells}/uL (ref 15–500)
Eosinophils Relative: 1.5 %
HCT: 36.8 % (ref 35.0–45.0)
Hemoglobin: 12 g/dL (ref 11.7–15.5)
MCH: 29.3 pg (ref 27.0–33.0)
MCHC: 32.6 g/dL (ref 32.0–36.0)
MCV: 89.8 fL (ref 80.0–100.0)
MPV: 9.4 fL (ref 7.5–12.5)
Monocytes Relative: 13.1 %
Neutro Abs: 4606 {cells}/uL (ref 1500–7800)
Neutrophils Relative %: 60.6 %
Platelets: 228 Thousand/uL (ref 140–400)
RBC: 4.1 Million/uL (ref 3.80–5.10)
RDW: 13.4 % (ref 11.0–15.0)
Total Lymphocyte: 24.4 %
WBC: 7.6 Thousand/uL (ref 3.8–10.8)

## 2024-07-30 LAB — COMPREHENSIVE METABOLIC PANEL WITH GFR
AG Ratio: 2.1 (calc) (ref 1.0–2.5)
ALT: 13 U/L (ref 6–29)
AST: 16 U/L (ref 10–35)
Albumin: 4.2 g/dL (ref 3.6–5.1)
Alkaline phosphatase (APISO): 48 U/L (ref 37–153)
BUN: 14 mg/dL (ref 7–25)
CO2: 25 mmol/L (ref 20–32)
Calcium: 8.8 mg/dL (ref 8.6–10.4)
Chloride: 102 mmol/L (ref 98–110)
Creat: 0.82 mg/dL (ref 0.60–1.00)
Globulin: 2 g/dL (ref 1.9–3.7)
Glucose, Bld: 78 mg/dL (ref 65–99)
Potassium: 5 mmol/L (ref 3.5–5.3)
Sodium: 135 mmol/L (ref 135–146)
Total Bilirubin: 0.4 mg/dL (ref 0.2–1.2)
Total Protein: 6.2 g/dL (ref 6.1–8.1)
eGFR: 74 mL/min/1.73m2 (ref >=60)

## 2024-07-30 NOTE — Progress Notes (Signed)
 CBC and CMP normal

## 2024-08-02 ENCOUNTER — Other Ambulatory Visit: Payer: Self-pay | Admitting: Physician Assistant

## 2024-08-02 NOTE — Telephone Encounter (Signed)
 Last Fill: 09/14/2023  Next Visit: 10/02/2024  Last Visit: 07/02/2024  Dx: Morphea   Current Dose per office note on 07/02/2024: not discussed  Okay to refill Baclofen ?

## 2024-08-13 ENCOUNTER — Other Ambulatory Visit: Payer: Self-pay | Admitting: Gastroenterology

## 2024-09-01 ENCOUNTER — Ambulatory Visit: Payer: Self-pay | Admitting: Internal Medicine

## 2024-09-01 NOTE — Progress Notes (Signed)
 Fibrosis is slightly worse than 2 years ago. . We will discuss this in your oct 2025 visit Xxxx  IMPRESSION: 1. Basilar predominant interstitial lung disease, as detailed above, slightly progressive from 07/20/2022. Findings are categorized as probable UIP per consensus guidelines: Diagnosis of Idiopathic Pulmonary Fibrosis: An Official ATS/ERS/JRS/ALAT Clinical Practice Guideline. Am JINNY [employer] Crit Care Med Vol 198, Iss 5, [phone], Aug 12 2017. 2. Small hiatal hernia. 3. Aortic atherosclerosis (ICD10-I70.0). Coronary artery calcification.     Electronically Signed   By: Newell Eke M.D.   On: 05/26/2024 15:29

## 2024-09-13 ENCOUNTER — Encounter

## 2024-09-16 ENCOUNTER — Ambulatory Visit: Admitting: Internal Medicine

## 2024-09-17 ENCOUNTER — Ambulatory Visit: Admitting: Internal Medicine

## 2024-09-17 ENCOUNTER — Encounter

## 2024-09-18 NOTE — Progress Notes (Signed)
 Office Visit Note  Patient: Heather Lindsey             Date of Birth: Apr 20, 1946           MRN: [id number]             PCP: Janey Santos, MD Referring: Janey Santos, MD Visit Date: 10/02/2024 Occupation: Data Unavailable  Subjective:  Fatigue   History of Present Illness: Heather Lindsey is a 78 y.o. female with history of mophea.  Patient has been taking methotrexate  3 tablets by mouth once weekly and folic acid  1 mg daily since the last office visit.  Patient states that she has noticed increased fatigue while taking methotrexate  and would like to discontinue methotrexate .  She has not noticed any clinical benefit while taking methotrexate .  She has not noticed any new lesions of morphea or progression.  Patient states that she has several new concerns.  She has been experiencing increased pain and swelling in the right index finger.  She is also having pain on the lateral aspect of both hips consistent with trochanter bursitis.  She has difficulty sleeping at night due to the pain when lying on her sides.  She has occasional discomfort in her lower back especially if standing for prolonged periods of time. She is also been having increased discomfort in the left foot and ankle.  Patient states that she previously fractured that ankle requiring surgical intervention.  Patient states that she has been having pain on the plantar aspect and is concerned that her arch may be following and that she has plantar fasciitis.  She denies any swelling in the ankle joint.    Activities of Daily Living:  Patient reports joint stiffness all day Patient Reports nocturnal pain.  Difficulty dressing/grooming: Denies Difficulty climbing stairs: Denies Difficulty getting out of chair: Denies Difficulty using hands for taps, buttons, cutlery, and/or writing: Reports  Review of Systems  Constitutional:  Positive for fatigue.  HENT:  Positive for mouth dryness. Negative for mouth sores.   Eyes:  Positive  for dryness.  Respiratory:  Negative for shortness of breath.   Cardiovascular:  Negative for chest pain and palpitations.  Gastrointestinal:  Negative for blood in stool, constipation and diarrhea.  Endocrine: Negative for increased urination.  Genitourinary:  Negative for involuntary urination.  Musculoskeletal:  Positive for joint pain, joint pain, joint swelling, myalgias, muscle weakness, morning stiffness, muscle tenderness and myalgias. Negative for gait problem.  Skin:  Negative for color change, rash, hair loss and sensitivity to sunlight.  Allergic/Immunologic: Negative for susceptible to infections.  Neurological:  Negative for dizziness and headaches.  Hematological:  Negative for swollen glands.  Psychiatric/Behavioral:  Negative for depressed mood and sleep disturbance. The patient is nervous/anxious.     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   HYPERCHOLESTEROLEMIA 11/18/2008   Depression 11/18/2008   IRRITABLE BOWEL SYNDROME 11/18/2008   Morphea 11/18/2008   Primary osteoarthritis of both hands 11/18/2008   Age related osteoporosis 11/18/2008  MIGRAINES, HX OF 11/18/2008    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   Brain bleed (HCC)    Broken wrist    LEFT,2006   Cataract    BILATERAL NO SURGERY YET UPDATED 06/09/22    Circumscribed scleroderma    Depression    Diverticulosis    GERD (gastroesophageal reflux disease)    Hyperlipidemia    IBS (irritable bowel syndrome)    Memory loss    Migraines    Osteoarthritis    Osteoporosis    Stroke (HCC) 2015    Family History  Problem Relation Age of Onset   Heart disease Father    Liver cancer Paternal Aunt    Prostate cancer Paternal Grandfather    Colon cancer Neg Hx    Stomach cancer Neg Hx    Pancreatic cancer Neg Hx    Esophageal cancer Neg Hx    Rectal cancer Neg Hx    Colon polyps Neg Hx    Crohn's disease Neg Hx    Past Surgical History:  Procedure Laterality Date   ANKLE FRACTURE SURGERY Left    COLONOSCOPY     ESOPHAGEAL MANOMETRY N/A 12/29/2021   Procedure: ESOPHAGEAL MANOMETRY (EM);  Surgeon: Legrand Victory LITTIE DOUGLAS, MD;  Location: [facility];  Service: Gastroenterology;  Laterality: N/A;  pt will stay on her PPI   ESOPHAGOGASTRODUODENOSCOPY N/A 02/24/2022   Procedure: ESOPHAGOGASTRODUODENOSCOPY (EGD);  Surgeon: Shyrl Linnie KIDD, MD;  Location: [facility] OR;  Service: Thoracic;  Laterality: N/A;   POLYPECTOMY     TOTAL HIP ARTHROPLASTY Left 01/06/2019   Procedure: TOTAL HIP ARTHROPLASTY ANTERIOR APPROACH;  Surgeon: Yvone Rush, MD;  Location: [facility];  Service: Orthopedics;  Laterality: Left;   TUBAL LIGATION     XI ROBOTIC ASSISTED HIATAL HERNIA REPAIR N/A 02/24/2022   Procedure: XI ROBOTIC ASSISTED HIATAL HERNIA REPAIR with Fundoplication using Myriad Matrix;  Surgeon: Shyrl Linnie KIDD, MD;  Location: [facility] OR;  Service: Thoracic;  Laterality: N/A;   Social History   Tobacco Use   Smoking status: Former    Current packs/day: 0.00    Average packs/day: 0.5 packs/day for 4.0 years (2.0 ttl pk-yrs)    Types: Cigarettes    Start date: 44    Quit date: 34    Years since quitting: 52.8    Passive exposure: Past   Smokeless tobacco: Never  Vaping Use   Vaping status: Never Used  Substance Use Topics   Alcohol use: Not Currently    Drug use: Never   Social History   Social History Narrative   Daily caffeine, 2 glasses daily,   Right handed, Married, 2 sons. Retired. College.     Immunization History  Administered Date(s) Administered   Fluzone Influenza virus vaccine,trivalent (IIV3), split virus 09/02/2010, 11/15/2011, 09/25/2012   INFLUENZA, HIGH DOSE SEASONAL PF 08/16/2017, 10/21/2022   Influenza Split 11/02/2009, 11/25/2013, 09/30/2014   Influenza, Quadrivalent, Recombinant, Inj, Pf 09/08/2018, 09/12/2019, 10/07/2020, 10/06/2021   Influenza,inj,Quad PF,6+ Mos 08/20/2015   Influenza-Unspecified 09/12/2023   PFIZER(Purple Top)SARS-COV-2 Vaccination 12/28/2019, 01/18/2020, 08/07/2020   Pneumococcal Conjugate-13 11/25/2013   Tdap 10/15/2007   Zoster Recombinant(Shingrix) 03/01/2018, 06/17/2018   Zoster, Live 11/02/2009, 04/16/2013     Objective: Vital Signs: BP 121/80 (BP Location: Left Arm, Patient Position: Sitting, Cuff Size: Large)   Pulse 74   Temp (!) 97.1 F (36.2 C)   Resp 14  Ht 5' 3 (1.6 m)   Wt 152 lb 9.6 oz (69.2 kg)   BMI 27.03 kg/m    Physical Exam Vitals and nursing note reviewed.  Constitutional:      Appearance: She is well-developed.  HENT:     Head: Normocephalic and atraumatic.  Eyes:     Conjunctiva/sclera: Conjunctivae normal.  Cardiovascular:     Rate and Rhythm: Normal rate and regular rhythm.     Heart sounds: Normal heart sounds.  Pulmonary:     Effort: Pulmonary effort is normal.     Breath sounds: Normal breath sounds.  Abdominal:     General: Bowel sounds are normal.     Palpations: Abdomen is soft.  Musculoskeletal:     Cervical back: Normal range of motion.  Lymphadenopathy:     Cervical: No cervical adenopathy.  Skin:    General: Skin is warm and dry.     Capillary Refill: Capillary refill takes less than 2 seconds.  Neurological:     Mental Status: She is alert and oriented to person, place, and time.  Psychiatric:        Behavior: Behavior  normal.      Musculoskeletal Exam: C-spine, thoracic spine, lumbar spine good range of motion.  Shoulder joints, elbow joints, wrist joints, MCPs, PIPs and DIPs have good range of motion with no synovitis.  Inflammation was noted in the right second PIP joint.  CMC, PIP, DIP thickening noted.  Left hip replacement has good range of motion.  Tenderness over the trochanteric bursa of both hips.  Tenderness along the right IT band.  Knee joints have good range of motion with no warmth or effusion.  Some tenderness at the left ankle and along the posterior tibialis tendon.  Tenderness of the plantar fascia of the left foot also noted.  CDAI Exam: CDAI Score: -- Patient Global: --; Provider Global: -- Swollen: --; Tender: -- Joint Exam 10/02/2024   No joint exam has been documented for this visit   There is currently no information documented on the homunculus. Go to the Rheumatology activity and complete the homunculus joint exam.  Investigation: No additional findings.  Imaging: No results found.  Recent Labs: Lab Results  Component Value Date   WBC 8.3 09/23/2024   HGB 12.6 09/23/2024   PLT 250 09/23/2024   NA 134 (L) 09/23/2024   K 4.7 09/23/2024   CL 98 09/23/2024   CO2 26 09/23/2024   GLUCOSE 83 09/23/2024   BUN 14 09/23/2024   CREATININE 0.97 09/23/2024   BILITOT 0.4 09/23/2024   ALKPHOS 47 01/15/2024   AST 17 09/23/2024   ALT 13 09/23/2024   PROT 6.6 09/23/2024   ALBUMIN 3.9 01/15/2024   CALCIUM  10.3 09/23/2024   GFRAA 71 03/18/2021   QFTBGOLDPLUS NEGATIVE 07/02/2024    Speciality Comments: No specialty comments available.  Procedures:  No procedures performed Allergies: Abilify  [aripiprazole ], Actonel [risedronate], Alendronate, and Boniva [ibandronate]    Assessment / Plan:     Visit Diagnoses: Morphea - longstanding history of morphea.  She was under care of Dr. Jorizzo in the past and was treated with methotrexate  for a long time.good response to  methotrexate  in the past: Methotrexate  was discontinued in 2020 during the COVID-19 pandemic.  The patient was restarted on low-dose methotrexate  3 tablets weekly and folic acid  1 mg daily after the last office visit on 07/02/2024 to try to prevent progression of morphea.  She has not noticed any clinical benefit and has noticed increased  fatigue while taking methotrexate .  She would like to discontinue methotrexate  to see if her energy level improves.  Patient was advised to monitor symptoms closely and to notify us  if she develops any signs or symptoms of progressive disease. She has not had any symptoms of Raynaud's phenomenon.  No signs of sclerodactyly were noted. She will follow-up in the office in 6 months or sooner if needed.  Raynaud's phenomenon without gangrene: She has not had any symptoms of Raynaud's phenomenon.  No signs of sclerodactyly noted.  No digital ulcerations noted.  High risk medication use - Methotrexate  restarted after last visit on 07/02/24-she has not noticed any clinical benefit and has started to have increased fatigue which she feels may be related to methotrexate  use.  She plans on discontinuing methotrexate  and seeing if the fatigue will improve. CBC and CMP drawn on 09/23/2024.  Primary osteoarthritis of both hands: She has PIP and DIP thickening consistent with osteoarthritis of both hands.  CMC joint prominence noted bilaterally.  She has been experiencing increased pain, tenderness, and swelling of the right index finger.  On examination she has inflammation in the right second PIP joint. She was advised to notify us  if the symptoms persist or worsen once she has discontinued methotrexate .  Status post total hip replacement, left: Doing well.  No groin pain currently.  Tenderness over the left trochanteric bursa noted.  Trochanteric bursitis of both hips: Patient has been experiencing interrupted sleep at night due to nocturnal pain when laying on her sides.  She is  currently experiencing pain due to trochanteric bursitis of both hips as well as IT band syndrome affecting the right side.  Different treatment options were discussed today.  Offered a referral to physical therapy.  She was also given a handout of home exercises to perform.  Abnormal chest CT: Basilar predominant interstitial lung disease, as detailed above, slightly progressive from 07/20/2022. Findings are categorized as probable UIP per consensus guidelines: Diagnosis of Idiopathic Pulmonary Fibrosis.  Followed by Dr. Leocadia to see Cloretta pulm on 11/04/2024.   Age-related osteoporosis without current pathological fracture - She is prescribed Prolia  injections by Dr. Janey.  Other medical conditions are listed as follows:  History of gastroesophageal reflux (GERD)  History of IBS  Hiatal hernia  History of hypercholesterolemia  History of migraine  History of depression  Orders: No orders of the defined types were placed in this encounter.  Meds ordered this encounter  Medications   baclofen  (LIORESAL ) 10 MG tablet    Sig: Take 1 tablet (10 mg total) by mouth daily as needed for muscle spasms.    Dispense:  30 tablet    Refill:  0     Follow-Up Instructions: Return in about 6 months (around 04/02/2025) for Morphea.   Waddell CHRISTELLA Craze, PA-C  Note - This record has been created using Dragon software.  Chart creation errors have been sought, but may not always  have been located. Such creation errors do not reflect on  the standard of medical care.

## 2024-09-23 ENCOUNTER — Other Ambulatory Visit: Payer: Self-pay

## 2024-09-23 DIAGNOSIS — Z79899 Other long term (current) drug therapy: Secondary | ICD-10-CM

## 2024-09-24 ENCOUNTER — Ambulatory Visit: Payer: Self-pay | Admitting: Physician Assistant

## 2024-09-24 LAB — CBC WITH DIFFERENTIAL/PLATELET
Absolute Lymphocytes: 2532 {cells}/uL (ref 850–3900)
Absolute Monocytes: 938 {cells}/uL (ref 200–950)
Basophils Absolute: 42 {cells}/uL (ref 0–200)
Basophils Relative: 0.5 %
Eosinophils Absolute: 108 {cells}/uL (ref 15–500)
Eosinophils Relative: 1.3 %
HCT: 38.2 % (ref 35.0–45.0)
Hemoglobin: 12.6 g/dL (ref 11.7–15.5)
MCH: 30.2 pg (ref 27.0–33.0)
MCHC: 33 g/dL (ref 32.0–36.0)
MCV: 91.6 fL (ref 80.0–100.0)
MPV: 9.5 fL (ref 7.5–12.5)
Monocytes Relative: 11.3 %
Neutro Abs: 4681 {cells}/uL (ref 1500–7800)
Neutrophils Relative %: 56.4 %
Platelets: 250 Thousand/uL (ref 140–400)
RBC: 4.17 Million/uL (ref 3.80–5.10)
RDW: 13.9 % (ref 11.0–15.0)
Total Lymphocyte: 30.5 %
WBC: 8.3 Thousand/uL (ref 3.8–10.8)

## 2024-09-24 LAB — COMPREHENSIVE METABOLIC PANEL WITH GFR
AG Ratio: 1.9 (calc) (ref 1.0–2.5)
ALT: 13 U/L (ref 6–29)
AST: 17 U/L (ref 10–35)
Albumin: 4.3 g/dL (ref 3.6–5.1)
Alkaline phosphatase (APISO): 45 U/L (ref 37–153)
BUN: 14 mg/dL (ref 7–25)
CO2: 26 mmol/L (ref 20–32)
Calcium: 10.3 mg/dL (ref 8.6–10.4)
Chloride: 98 mmol/L (ref 98–110)
Creat: 0.97 mg/dL (ref 0.60–1.00)
Globulin: 2.3 g/dL (ref 1.9–3.7)
Glucose, Bld: 83 mg/dL (ref 65–139)
Potassium: 4.7 mmol/L (ref 3.5–5.3)
Sodium: 134 mmol/L — ABNORMAL LOW (ref 135–146)
Total Bilirubin: 0.4 mg/dL (ref 0.2–1.2)
Total Protein: 6.6 g/dL (ref 6.1–8.1)
eGFR: 60 mL/min/1.73m2 (ref >=60)

## 2024-09-24 NOTE — Progress Notes (Signed)
 CBC WNL Sodium is borderline low-134, rest of CMP WNL.

## 2024-10-02 ENCOUNTER — Encounter: Payer: Self-pay | Admitting: Physician Assistant

## 2024-10-02 ENCOUNTER — Ambulatory Visit: Attending: Physician Assistant | Admitting: Physician Assistant

## 2024-10-02 VITALS — BP 121/80 | HR 74 | Temp 97.1°F | Resp 14 | Ht 63.0 in | Wt 152.6 lb

## 2024-10-02 DIAGNOSIS — R9389 Abnormal findings on diagnostic imaging of other specified body structures: Secondary | ICD-10-CM

## 2024-10-02 DIAGNOSIS — Z96642 Presence of left artificial hip joint: Secondary | ICD-10-CM

## 2024-10-02 DIAGNOSIS — L940 Localized scleroderma [morphea]: Secondary | ICD-10-CM

## 2024-10-02 DIAGNOSIS — I7300 Raynaud's syndrome without gangrene: Secondary | ICD-10-CM

## 2024-10-02 DIAGNOSIS — Z8669 Personal history of other diseases of the nervous system and sense organs: Secondary | ICD-10-CM

## 2024-10-02 DIAGNOSIS — Z8719 Personal history of other diseases of the digestive system: Secondary | ICD-10-CM

## 2024-10-02 DIAGNOSIS — M19041 Primary osteoarthritis, right hand: Secondary | ICD-10-CM

## 2024-10-02 DIAGNOSIS — K449 Diaphragmatic hernia without obstruction or gangrene: Secondary | ICD-10-CM

## 2024-10-02 DIAGNOSIS — M7061 Trochanteric bursitis, right hip: Secondary | ICD-10-CM

## 2024-10-02 DIAGNOSIS — M7062 Trochanteric bursitis, left hip: Secondary | ICD-10-CM

## 2024-10-02 DIAGNOSIS — Z79899 Other long term (current) drug therapy: Secondary | ICD-10-CM | POA: Diagnosis not present

## 2024-10-02 DIAGNOSIS — M810 Age-related osteoporosis without current pathological fracture: Secondary | ICD-10-CM

## 2024-10-02 DIAGNOSIS — Z8659 Personal history of other mental and behavioral disorders: Secondary | ICD-10-CM

## 2024-10-02 DIAGNOSIS — Z8639 Personal history of other endocrine, nutritional and metabolic disease: Secondary | ICD-10-CM

## 2024-10-02 DIAGNOSIS — M19042 Primary osteoarthritis, left hand: Secondary | ICD-10-CM

## 2024-10-02 MED ORDER — BACLOFEN 10 MG PO TABS: 10.0000 mg | ORAL_TABLET | Freq: Every day | ORAL | 0 refills | Status: DC | PRN

## 2024-10-02 NOTE — Patient Instructions (Signed)
 Iliotibial Band Syndrome Rehab Ask your health care provider which exercises are safe for you. Do exercises exactly as told by your provider and adjust them as told. It's normal to feel mild stretching, pulling, tightness, or discomfort as you do these exercises. Stop right away if you feel sudden pain or your pain gets a lot worse. Do not begin these exercises until told by your provider. Stretching and range-of-motion exercises These exercises warm up your muscles and joints. They also improve the movement and flexibility of your hip and pelvis. Quadriceps stretch, prone  Lie face down (prone) on a firm surface like a bed or padded floor. Bend your left / right knee. Reach back to hold your ankle or pant leg. If you can't reach your ankle or pant leg, use a belt looped around your foot and grab the belt instead. Gently pull your heel toward your butt. Your knee should not slide out to the side. You should feel a stretch in the front of your thigh and knee, also called the quadriceps. Hold this position for __________ seconds. Repeat __________ times. Complete this exercise __________ times a day. Iliotibial band stretch The iliotibial band is a strip of tissue that runs along the outside of your hip down to your knee. Lie on your side with your left / right leg on top. Bend both knees and grab your left / right ankle. Stretch out your bottom arm to help you balance. Slowly bring your top knee back so your thigh goes behind your back. Slowly lower your top leg toward the floor until you feel a gentle stretch on the outside of your left / right hip and thigh. If you don't feel a stretch and your knee won't go farther, place the heel of your other foot on top of your knee and pull your knee down toward the floor with your foot. Hold this position for __________ seconds. Repeat __________ times. Complete this exercise __________ times a day. Strengthening exercises These exercises build strength  and endurance in your hip and pelvis. Endurance means your muscles can keep working even when they're tired. Straight leg raises, side-lying This exercise strengthens the muscles that rotate the leg at the hip and move it away from your body. These muscles are called hip abductors. Lie on your side with your left / right leg on top. Lie so your head, shoulder, hip, and knee line up. You can bend your bottom knee to help you balance. Roll your hips slightly forward so they're stacked directly over each other. Your left / right knee should face forward. Tense the muscles in your outer thigh and hip. Lift your top leg 4-6 inches (10-15 cm) off the ground. Hold this position for __________ seconds. Slowly lower your leg back down to the starting position. Let your muscles fully relax before doing this exercise again. Repeat __________ times. Complete this exercise __________ times a day. Leg raises, prone This exercise strengthens the muscles that move the hips backward. These muscles are called hip extensors. Lie face down (prone) on your bed or a firm surface. You can put a pillow under your hips for comfort and to support your lower back. Bend your left / right knee so your foot points straight up toward the ceiling. Keep the other leg straight and behind you. Squeeze your butt muscles. Lift your left / right thigh off the firm surface. Do not let your back arch. Tense your thigh muscle as hard as you can without having  more knee pain. Hold this position for __________ seconds. Slowly lower your leg to the starting position. Allow your leg to relax all the way. Repeat __________ times. Complete this exercise __________ times a day. Hip hike  Stand sideways on a bottom step. Place your feet so that your left / right leg is on the step, and the other foot is hanging off the side. If you need support for balance, hold onto a railing or wall. Keep your knees straight and your abdomen square,  meaning your hips are level. Then, lift your left / right hip up toward the ceiling. Slowly let your leg that's hanging off the step lower towards the floor. Your foot should get closer to the ground. Do not lean or bend your knees during this movement. Repeat __________ times. Complete this exercise __________ times a day.    Hip Bursitis Rehab Ask your health care provider which exercises are safe for you. Do exercises exactly as told by your health care provider and adjust them as directed. It is normal to feel mild stretching, pulling, tightness, or discomfort as you do these exercises. Stop right away if you feel sudden pain or your pain gets worse. Do not begin these exercises until told by your health care provider. Stretching exercise This exercise warms up your muscles and joints and improves the movement and flexibility of your hip. This exercise also helps to relieve pain and stiffness. Iliotibial band stretch An iliotibial band is a strong band of muscle tissue that runs from the outer side of your hip to the outer side of your thigh and knee. Lie on your side with your left / right leg in the top position. Bend your left / right knee and grab your ankle. Stretch out your bottom arm to help you balance. Slowly bring your knee back so your thigh is slightly behind your body. Slowly lower your knee toward the floor until you feel a gentle stretch on the outside of your left / right thigh. If you do not feel a stretch and your knee will not lower more toward the floor, place the heel of your other foot on top of your knee and pull your knee down toward the floor with your foot. Hold this position for __________ seconds. Slowly return to the starting position. Repeat __________ times. Complete this exercise __________ times a day. Strengthening exercises These exercises build strength and endurance in your hip and pelvis. Endurance is the ability to use your muscles for a long time, even  after they get tired. Bridge This exercise strengthens the muscles that move your thigh backward (hip extensors). Lie on your back on a firm surface with your knees bent and your feet flat on the floor. Tighten your buttocks muscles and lift your buttocks off the floor until your trunk is level with your thighs. Do not arch your back. You should feel the muscles working in your buttocks and the back of your thighs. If you do not feel these muscles, slide your feet 1-2 inches (2.5-5 cm) farther away from your buttocks. If this exercise is too easy, try doing it with your arms crossed over your chest. Hold this position for __________ seconds. Slowly lower your hips to the starting position. Let your muscles relax completely after each repetition. Repeat __________ times. Complete this exercise __________ times a day. Squats This exercise strengthens the muscles in front of your thigh and knee (quadriceps). Stand in front of a table, with your feet and  knees pointing straight ahead. You may rest your hands on the table for balance but not for support. Slowly bend your knees and lower your hips like you are going to sit in a chair. Keep your weight over your heels, not over your toes. Keep your lower legs upright so they are parallel with the table legs. Do not let your hips go lower than your knees. Do not bend lower than told by your health care provider. If your hip pain increases, do not bend as low. Hold the squat position for __________ seconds. Slowly push with your legs to return to standing. Do not use your hands to pull yourself to standing. Repeat __________ times. Complete this exercise __________ times a day. Hip hike  Stand sideways on a bottom step. Stand on your left / right leg with your other foot unsupported next to the step. You can hold on to the railing or wall for balance if needed. Keep your knees straight and your torso square. Then lift your left / right hip up  toward the ceiling. Hold this position for __________ seconds. Slowly let your left / right hip lower toward the floor, past the starting position. Your foot should get closer to the floor. Do not lean or bend your knees. Repeat __________ times. Complete this exercise __________ times a day. Single leg stand This exercise increases your balance. Without shoes, stand near a railing or in a doorway. You may hold on to the railing or door frame as needed for balance. Squeeze your left / right buttock muscles, then lift up your other foot. Do not let your left / right hip push out to the side. It is helpful to stand in front of a mirror for this exercise so you can watch your hip. Hold this position for __________ seconds. Repeat __________ times. Complete this exercise __________ times a day. This information is not intended to replace advice given to you by your health care provider. Make sure you discuss any questions you have with your health care provider. Document Revised: 11/10/2021 Document Reviewed: 11/10/2021 Elsevier Patient Education  2024 [employer].

## 2024-10-14 ENCOUNTER — Encounter

## 2024-11-04 ENCOUNTER — Encounter: Payer: Self-pay | Admitting: Adult Health

## 2024-11-04 ENCOUNTER — Ambulatory Visit (HOSPITAL_BASED_OUTPATIENT_CLINIC_OR_DEPARTMENT_OTHER)

## 2024-11-04 ENCOUNTER — Ambulatory Visit: Admitting: Adult Health

## 2024-11-04 VITALS — BP 132/83 | HR 82 | Temp 97.8°F | Ht 63.0 in | Wt 146.6 lb

## 2024-11-04 DIAGNOSIS — K449 Diaphragmatic hernia without obstruction or gangrene: Secondary | ICD-10-CM | POA: Diagnosis not present

## 2024-11-04 DIAGNOSIS — J310 Chronic rhinitis: Secondary | ICD-10-CM | POA: Diagnosis not present

## 2024-11-04 DIAGNOSIS — K219 Gastro-esophageal reflux disease without esophagitis: Secondary | ICD-10-CM

## 2024-11-04 DIAGNOSIS — J849 Interstitial pulmonary disease, unspecified: Secondary | ICD-10-CM

## 2024-11-04 DIAGNOSIS — Z9889 Other specified postprocedural states: Secondary | ICD-10-CM

## 2024-11-04 LAB — PULMONARY FUNCTION TEST
FEF 25-75 Post: 2.87 L/s
FEF 25-75 Pre: 2.6 L/s
FEF2575-%Change-Post: 10 %
FEF2575-%Pred-Post: 194 %
FEF2575-%Pred-Pre: 176 %
FEV1-%Change-Post: 4 %
FEV1-%Pred-Post: 131 %
FEV1-%Pred-Pre: 125 %
FEV1-Post: 2.5 L
FEV1-Pre: 2.4 L
FEV1FVC-%Change-Post: 5 %
FEV1FVC-%Pred-Pre: 106 %
FEV6-%Change-Post: 2 %
FEV6-%Pred-Post: 123 %
FEV6-%Pred-Pre: 119 %
FEV6-Post: 2.99 L
FEV6-Pre: 2.9 L
FEV6FVC-%Change-Post: 0 %
FEV6FVC-%Pred-Post: 105 %
FEV6FVC-%Pred-Pre: 105 %
FVC-%Change-Post: -1 %
FVC-%Pred-Post: 116 %
FVC-%Pred-Pre: 118 %
FVC-Post: 2.99 L
FVC-Pre: 3.02 L
Post FEV1/FVC ratio: 84 %
Post FEV6/FVC ratio: 100 %
Pre FEV1/FVC ratio: 79 %
Pre FEV6/FVC Ratio: 100 %

## 2024-11-04 NOTE — Progress Notes (Signed)
 @Patient  ID: Heather Lindsey, female    DOB: [birth year], 78 y.o.   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-none since age mid 86s  TEST/EVENTS : Reviewed 11/04/2024  High-resolution CT chest July 20, 2022 shows mild pulmonary fibrosis in the pattern of apical to basal gradient-probable UIP, stable High-resolution CT chest May 15, 2024 basilar predominant coarse and subpleural ground glass, reticulation and traction bronchiectasis slightly progressive since August 2023, probable UIP Autoimmune/CTD serology essentially negative except for an elevated C3 complement (minimally)  PFT May 21, 2024 FEV1 123, ratio 79, FVC 115%, diffusing capacity 71% Spirometry November 04, 2024 shows no significant decline FEV1 123, ratio 79, FVC 118%.  Unfortunately DLCO was not completed  Discussed the use of AI scribe software for clinical note transcription with the patient, who gave verbal consent to proceed.  History of Present Illness Heather Lindsey is a 78 year old female with mild interstitial lung disease for a 92-month follow-up.  Overall patient says her breathing is doing about the same.  Patient was set up for a high-resolution CT chest completed on May 15, 2024 that showed slightly progressive interstitial changes with basilar and subpleural ground glass, reticulation and traction bronchiectasis.  Probable UIP pattern.  Compared to CT chest from 2023 she was set up for PFTs that were completed today with pre and post spirometry showing stable lung function FEV1 at 131%, ratio 84, FVC 116%.  No DLCO was completed.  She experiences  occasional shortness of breath during exertion, such as climbing stairs or going up hills. She engages in water  aerobics five times a week, which does not usually cause shortness of breath. She remains independent, living at home and driving herself.  Patient says she was admitted to the hospital earlier this year for pneumonia and influenza.  She says it took her a good while to improve.    Denies any flare of reflux.  Remains on pantoprazole  daily. She experiences occasional coughing, which she attributes to congestion and allergies.   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  Was on methotrexate  for extended amount of time.  This was stopped in 2020.  Recently was restarted on methotrexate  from July 22 until October 22.  According to rheumatology notes she had no perceived benefit and felt she was having increased side effects so this was discontinued at office visit on October 22.  She also has a history of Raynaud's phenomenon.    Allergies  Allergen Reactions   Abilify  [Aripiprazole ]     Sodium decline   Actonel [Risedronate]     Severe chest pain   Alendronate Other (See Comments)    Severe chest pain   Boniva [Ibandronate]     Chest pain    Immunization History  Administered Date(s) Administered   Fluzone Influenza virus vaccine,trivalent (IIV3), split virus 09/02/2010, 11/15/2011, 09/25/2012   INFLUENZA, HIGH DOSE SEASONAL PF 08/16/2017, 10/21/2022   Influenza Split 11/02/2009, 11/25/2013, 09/30/2014   Influenza, Quadrivalent, Recombinant, Inj, Pf 09/08/2018, 09/12/2019, 10/07/2020, 10/06/2021   Influenza,inj,Quad PF,6+ Mos 08/20/2015   Influenza-Unspecified 09/12/2023   PFIZER(Purple Top)SARS-COV-2 Vaccination 12/28/2019, 01/18/2020, 08/07/2020  Pneumococcal Conjugate-13 11/25/2013   Tdap 10/15/2007   Zoster Recombinant(Shingrix) 03/01/2018, 06/17/2018   Zoster, Live 11/02/2009, 04/16/2013    Past Medical History:  Diagnosis Date   Allergy     SEASONAL   Anxiety    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   Brain bleed (HCC)    Broken wrist    LEFT,2006   Cataract    BILATERAL NO SURGERY YET UPDATED 06/09/22   Circumscribed scleroderma    Depression    Diverticulosis    GERD (gastroesophageal reflux disease)    Hyperlipidemia    IBS (irritable bowel syndrome)    Memory loss    Migraines    Osteoarthritis    Osteoporosis    Stroke (HCC) 2015    Tobacco History: Social History   Tobacco Use  Smoking Status Former   Current packs/day: 0.00   Average packs/day: 0.5 packs/day for 4.0 years (2.0 ttl pk-yrs)   Types: Cigarettes   Start date: 69   Quit date: 47   Years since quitting: 52.9   Passive exposure: Past  Smokeless Tobacco Never   Counseling given: Not Answered   Outpatient Medications Prior to Visit  Medication Sig Dispense Refill   acetaminophen  (TYLENOL ) 325 MG tablet Take 2 tablets (650 mg total) by mouth every 6 (six) hours as needed for mild pain.     ALPRAZolam  (XANAX ) 0.5 MG tablet Take 0.25 mg by mouth at bedtime as needed for anxiety or sleep.     baclofen  (LIORESAL ) 10 MG tablet Take 1 tablet (10 mg total) by mouth daily as needed for muscle spasms. 30 tablet 0   buPROPion  (WELLBUTRIN ) 100 MG tablet Take 100 mg by mouth daily.     carboxymethylcellulose (REFRESH PLUS) 0.5 % SOLN Place 1 drop into both eyes 3 (three) times daily as needed (dry eyes).     Cholecalciferol 50 MCG (2000 UT) CAPS Take 2,000 Units by mouth daily.     denosumab  (PROLIA ) 60 MG/ML SOSY injection Inject 60 mg into the skin every 6 (six) months.     diclofenac Sodium (VOLTAREN) 1 % GEL Apply 1 application. topically daily as needed (pain).     fluticasone  (FLONASE ) 50 MCG/ACT nasal spray Place 1 spray into both nostrils daily.     hyoscyamine (LEVSIN) 0.125 MG tablet Take 0.125 mg by mouth every 4 (four) hours as needed for bladder spasms or cramping.     loratadine  (CLARITIN ) 10 MG tablet Take  10 mg by mouth daily as needed for allergies.     Multiple Vitamin (MULTI-VITAMIN DAILY PO) Take 1 tablet by mouth daily.     Olopatadine HCl 0.2 % SOLN Place 1 drop into both eyes daily.     pantoprazole  (PROTONIX ) 40 MG tablet Take 1 tablet (40 mg total) by mouth daily. 90 tablet 0   rosuvastatin (CRESTOR) 5 MG tablet Take 5 mg by mouth daily.     venlafaxine  XR (EFFEXOR -XR) 150 MG 24 hr capsule Take 150 mg by mouth daily with breakfast.     OLANZapine (ZYPREXA) 10 MG tablet Take 10 mg by mouth at bedtime. (Patient not taking: Reported on 11/04/2024)     methotrexate  (RHEUMATREX) 2.5 MG tablet Take 3 tablets (7.5 mg total) by mouth once a week. Caution:Chemotherapy. Protect from light. 12 tablet 2   No facility-administered medications prior to visit.     Review of Systems:   Constitutional:   No  weight loss, night sweats,  Fevers, chills, fatigue, or  lassitude.  HEENT:   No headaches,  Difficulty swallowing,  Tooth/dental problems, or  Sore throat,                No sneezing, itching, ear ache, nasal congestion, post nasal drip,   CV:  No chest pain,  Orthopnea, PND, swelling in lower extremities, anasarca, dizziness, palpitations, syncope.   GI  No heartburn, indigestion, abdominal pain, nausea, vomiting, diarrhea, change in bowel habits, loss of appetite, bloody stools.   Resp: No shortness of breath with exertion or at rest.  No excess mucus, no productive cough,  No non-productive cough,  No coughing up of blood.  No change in color of mucus.  No wheezing.  No chest wall deformity  Skin: no rash or lesions.  GU: no dysuria, change in color of urine, no urgency or frequency.  No flank pain, no hematuria   MS:  No joint pain or swelling.  No decreased range of motion.  No back pain.    Physical Exam  BP 132/83   Pulse 82   Temp 97.8 F (36.6 C)   Ht 5' 3 (1.6 m) Comment: Per pt  Wt 146 lb 9.6 oz (66.5 kg)   SpO2 98% Comment: RA  BMI 25.97 kg/m   GEN: A/Ox3;  pleasant , NAD, well nourished    HEENT:  [place]/AT,  NOSE-clear, THROAT-clear, no lesions, no postnasal drip or exudate noted.   NECK:  Supple w/ fair ROM; no JVD; normal carotid impulses w/o bruits; no thyromegaly or nodules palpated; no lymphadenopathy.    RESP  Clear  P & A; w/o, wheezes/ rales/ or rhonchi. no accessory muscle use, no dullness to percussion  CARD:  RRR, no m/r/g, no peripheral edema, pulses intact, no cyanosis or clubbing.  GI:   Soft & nt; nml bowel sounds; no organomegaly or masses detected.   Musco: Warm bil, no deformities or joint swelling noted.   Neuro: alert, no focal deficits noted.    Skin: Warm, no lesions or rashes    Lab Results:Reviewed 11/04/2024   CBC    Component Value Date/Time   WBC 8.3 09/23/2024 1439   RBC 4.17 09/23/2024 1439   HGB 12.6 09/23/2024 1439   HCT 38.2 09/23/2024 1439   PLT 250 09/23/2024 1439   MCV 91.6 09/23/2024 1439   MCH 30.2 09/23/2024 1439   MCHC 33.0 09/23/2024 1439   RDW 13.9 09/23/2024 1439   LYMPHSABS 2,129 09/14/2021 1004   MONOABS 0.3 01/05/2019 1903   EOSABS 108 09/23/2024 1439   BASOSABS 42 09/23/2024 1439    BMET    Component Value Date/Time   NA 134 (L) 09/23/2024 1439   NA 136 11/27/2014 1444   K 4.7 09/23/2024 1439   CL 98 09/23/2024 1439   CO2 26 09/23/2024 1439   GLUCOSE 83 09/23/2024 1439   BUN 14 09/23/2024 1439   BUN 24 11/27/2014 1444   CREATININE 0.97 09/23/2024 1439   CALCIUM  10.3 09/23/2024 1439   GFRNONAA >60 01/17/2024 0326   GFRNONAA 61 03/18/2021 0927   GFRAA 71 03/18/2021 0927    BNP No results found for: BNP  ProBNP No results found for: PROBNP  Imaging: No results found.  Administration History     None          Latest Ref Rng & Units 11/04/2024   12:46 PM 05/21/2024    3:02 PM 01/04/2024    2:00 PM 06/19/2023    7:27 AM 11/17/2022    1:01 PM  08/18/2022    8:57 AM  PFT Results  FVC-Pre L 3.02  P 2.96  2.85  2.69  2.93  2.88   FVC-Predicted Pre % 118  P  115  109  103  111  109   FVC-Post L 2.99  P     2.83   FVC-Predicted Post % 116  P     107   Pre FEV1/FVC % % 79  P 79  80  85  84  82   Post FEV1/FCV % % 84  P     82   FEV1-Pre L 2.40  P 2.35  2.29  2.27  2.46  2.36   FEV1-Predicted Pre % 125  P 123  118  117  124  119   FEV1-Post L 2.50  P     2.33   DLCO uncorrected ml/min/mmHg  12.85  13.48  15.01  14.26  12.89   DLCO UNC% %  71  74  82  78  71   DLCO corrected ml/min/mmHg   13.48  15.01  14.26  12.89   DLCO COR %Predicted %   74  82  78  71   DLVA Predicted %  73  74  84  74  67   TLC L      4.91   TLC % Predicted %      101   RV % Predicted %      80     P Preliminary result    No results found for: NITRICOXIDE      No data to display              Assessment & Plan:   Assessment and Plan Assessment & Plan Interstitial lung disease -clinically appears stable .mild progression noted on CT imaging.   CT scan shows mild progression with increased subpleural reticulation and basilar predominance, but spirometry results remain unchanged. She has no symptoms of dyspnea or decreased activity tolerance and maintains a high level of independence and activity. Potential triggers include past pneumonia and influenza. Antifibrotic therapies were discussed to slow progression and improve symptoms. Pulmonary hypertension is a concern due to its association with fibrosis, which could lead to increased dyspnea and decreased activity tolerance.  PFT with diffusing capacity will be repeated in 3 months . A sit-stand or walk test will assess oxygen levels and activity capacity. Antifibrotic therapy will be considered if decline in lung function and/or functional capacity.  Also ongoing monitor for signs of pulmonary hypertension.  Consider echo if indicated   Chronic allergic rhinitis  -stable on current regimen Continue on Claritin   Morphea and Raynaud's phenomenon clinically stable.  Recently restarted on methotrexate  for 3 months  with no perceived benefit.  Continue follow-up with rheumatology.  GERD and history of hiatal hernia continue on pantoprazole  daily-appears stable   Plan  Patient Instructions  Activity as tolerated, keep up good job with exercise regimen  Follow up with Dr. Geronimo or Jonathyn Carothers NP in 3 months -ILD  With Spirometry with DLCO        Madelin Stank, NP 11/04/2024

## 2024-11-04 NOTE — Patient Instructions (Addendum)
 Activity as tolerated, keep up good job with exercise regimen  Follow up with Dr. Geronimo or Deseree Zemaitis NP in 3 months -ILD  With Spirometry with DLCO

## 2024-11-04 NOTE — Progress Notes (Signed)
Pre/Post Spirometry performed today.

## 2024-11-04 NOTE — Patient Instructions (Signed)
Pre/Post Spirometry performed today.

## 2024-11-15 ENCOUNTER — Other Ambulatory Visit: Payer: Self-pay | Admitting: Physician Assistant

## 2024-11-15 NOTE — Telephone Encounter (Signed)
 Last Fill: 10/02/2024  Next Visit: 04/03/2025  Last Visit: 10/02/2024  Dx: Morphea   Current Dose per office note on 10/02/2024: dose not discussed  Okay to refill Baclofen ?

## 2024-12-20 ENCOUNTER — Other Ambulatory Visit: Payer: Self-pay | Admitting: Rheumatology

## 2024-12-23 NOTE — Telephone Encounter (Signed)
 Last Fill: 11/15/2024  Next Visit: 04/03/2025  Last Visit: 10/02/2024  Dx: Trochanteric bursitis of both hips   Current Dose per office note on 10/02/2024: not discussed  Okay to refill Baclofen ?

## 2025-02-18 ENCOUNTER — Ambulatory Visit: Admitting: Internal Medicine

## 2025-02-18 ENCOUNTER — Encounter

## 2025-04-03 ENCOUNTER — Ambulatory Visit: Admitting: Rheumatology
